# Patient Record
Sex: Female | Born: 1955 | Race: Black or African American | Hispanic: No | Marital: Single | Smoking: Former smoker
Health system: Southern US, Community
[De-identification: ages and names within clinical notes are randomized; demographics above are authoritative.]

## PROBLEM LIST (undated history)

## (undated) DIAGNOSIS — Z8673 Personal history of transient ischemic attack (TIA), and cerebral infarction without residual deficits: Secondary | ICD-10-CM

## (undated) DIAGNOSIS — M25561 Pain in right knee: Secondary | ICD-10-CM

## (undated) DIAGNOSIS — I10 Essential (primary) hypertension: Secondary | ICD-10-CM

## (undated) DIAGNOSIS — G8929 Other chronic pain: Secondary | ICD-10-CM

## (undated) HISTORY — DX: Other chronic pain: G89.29

## (undated) HISTORY — DX: Pain in right knee: M25.561

## (undated) HISTORY — PX: BREAST BIOPSY: SHX20

## (undated) HISTORY — PX: OTHER SURGICAL HISTORY: SHX169

## (undated) HISTORY — PX: CATARACT EXTRACTION: SUR2

## (undated) HISTORY — DX: Personal history of transient ischemic attack (TIA), and cerebral infarction without residual deficits: Z86.73

## (undated) HISTORY — PX: TONSILLECTOMY: SUR1361

---

## 2006-12-05 ENCOUNTER — Emergency Department (HOSPITAL_COMMUNITY): Admission: EM | Admit: 2006-12-05 | Discharge: 2006-12-05 | Payer: Self-pay | Admitting: Family Medicine

## 2008-03-05 ENCOUNTER — Emergency Department (HOSPITAL_COMMUNITY): Admission: EM | Admit: 2008-03-05 | Discharge: 2008-03-05 | Payer: Self-pay | Admitting: Emergency Medicine

## 2008-05-01 ENCOUNTER — Emergency Department (HOSPITAL_COMMUNITY): Admission: EM | Admit: 2008-05-01 | Discharge: 2008-05-01 | Payer: Self-pay | Admitting: Emergency Medicine

## 2011-04-06 ENCOUNTER — Emergency Department (HOSPITAL_COMMUNITY)
Admission: EM | Admit: 2011-04-06 | Discharge: 2011-04-06 | Disposition: A | Discharge status: Home / Self Care | Payer: Medicaid Other | Attending: Emergency Medicine | Admitting: Emergency Medicine

## 2011-04-06 ENCOUNTER — Emergency Department (HOSPITAL_COMMUNITY): Payer: Medicaid Other

## 2011-04-06 DIAGNOSIS — R05 Cough: Secondary | ICD-10-CM | POA: Insufficient documentation

## 2011-04-06 DIAGNOSIS — R110 Nausea: Secondary | ICD-10-CM | POA: Insufficient documentation

## 2011-04-06 DIAGNOSIS — Z8673 Personal history of transient ischemic attack (TIA), and cerebral infarction without residual deficits: Secondary | ICD-10-CM | POA: Insufficient documentation

## 2011-04-06 DIAGNOSIS — I10 Essential (primary) hypertension: Secondary | ICD-10-CM | POA: Insufficient documentation

## 2011-04-06 DIAGNOSIS — E119 Type 2 diabetes mellitus without complications: Secondary | ICD-10-CM | POA: Insufficient documentation

## 2011-04-06 DIAGNOSIS — R059 Cough, unspecified: Secondary | ICD-10-CM | POA: Insufficient documentation

## 2011-04-06 DIAGNOSIS — J069 Acute upper respiratory infection, unspecified: Secondary | ICD-10-CM | POA: Insufficient documentation

## 2011-07-20 ENCOUNTER — Emergency Department (HOSPITAL_COMMUNITY): Payer: Medicaid Other

## 2011-07-20 ENCOUNTER — Emergency Department (HOSPITAL_COMMUNITY)
Admission: EM | Admit: 2011-07-20 | Discharge: 2011-07-20 | Disposition: A | Discharge status: Home / Self Care | Payer: Medicaid Other | Attending: Emergency Medicine | Admitting: Emergency Medicine

## 2011-07-20 DIAGNOSIS — M549 Dorsalgia, unspecified: Secondary | ICD-10-CM | POA: Insufficient documentation

## 2011-07-20 DIAGNOSIS — M545 Low back pain, unspecified: Secondary | ICD-10-CM | POA: Insufficient documentation

## 2011-07-20 DIAGNOSIS — E119 Type 2 diabetes mellitus without complications: Secondary | ICD-10-CM | POA: Insufficient documentation

## 2011-07-20 DIAGNOSIS — I10 Essential (primary) hypertension: Secondary | ICD-10-CM | POA: Insufficient documentation

## 2011-07-20 DIAGNOSIS — Z8673 Personal history of transient ischemic attack (TIA), and cerebral infarction without residual deficits: Secondary | ICD-10-CM | POA: Insufficient documentation

## 2011-07-20 DIAGNOSIS — Z794 Long term (current) use of insulin: Secondary | ICD-10-CM | POA: Insufficient documentation

## 2012-09-12 ENCOUNTER — Emergency Department (HOSPITAL_COMMUNITY): Payer: Medicaid Other

## 2012-09-12 ENCOUNTER — Emergency Department (HOSPITAL_COMMUNITY)
Admission: EM | Admit: 2012-09-12 | Discharge: 2012-09-12 | Disposition: A | Discharge status: Home / Self Care | Payer: Medicaid Other | Attending: Emergency Medicine | Admitting: Emergency Medicine

## 2012-09-12 ENCOUNTER — Encounter (HOSPITAL_COMMUNITY): Payer: Self-pay | Admitting: *Deleted

## 2012-09-12 DIAGNOSIS — E119 Type 2 diabetes mellitus without complications: Secondary | ICD-10-CM | POA: Insufficient documentation

## 2012-09-12 DIAGNOSIS — Z794 Long term (current) use of insulin: Secondary | ICD-10-CM | POA: Insufficient documentation

## 2012-09-12 DIAGNOSIS — I10 Essential (primary) hypertension: Secondary | ICD-10-CM | POA: Insufficient documentation

## 2012-09-12 DIAGNOSIS — R51 Headache: Secondary | ICD-10-CM | POA: Insufficient documentation

## 2012-09-12 HISTORY — DX: Essential (primary) hypertension: I10

## 2012-09-12 MED ORDER — DIPHENHYDRAMINE HCL 25 MG PO CAPS
25.0000 mg | ORAL_CAPSULE | Freq: Once | ORAL | Status: AC
  Administered 2012-09-12: 25 mg via ORAL
  Filled 2012-09-12: qty 1

## 2012-09-12 MED ORDER — METOCLOPRAMIDE HCL 5 MG/ML IJ SOLN
10.0000 mg | Freq: Once | INTRAMUSCULAR | Status: AC
  Administered 2012-09-12: 10 mg via INTRAMUSCULAR
  Filled 2012-09-12: qty 2

## 2012-09-12 NOTE — ED Provider Notes (Signed)
History     CSN: [id number]  Arrival date & time 09/12/12  1629   First MD Initiated Contact with Patient 09/12/12 1827      Chief Complaint  Patient presents with  . Headache     Patient is a 56 y.o. female presenting with headaches. The history is provided by the patient.  Headache  The problem occurs constantly. The problem has been gradually worsening. The headache is associated with nothing. The pain is located in the right unilateral region. The quality of the pain is described as dull. The pain is moderate. The pain does not radiate. Associated symptoms include nausea. Pertinent negatives include no fever, no near-syncope, no syncope and no vomiting. She has tried nothing for the symptoms.  pt presents for headache She reports migraine type headache for past several days but has not had migraine in "years" No trauma No fever No focal weakness No visual changes No tick bites  family reports she has distant h/o stroke.  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 Hypertension   . Diabetes mellitus     History reviewed. No pertinent past surgical history.  No family history on file.  History  Substance Use Topics  . Smoking status: Never Smoker   . Smokeless tobacco: Not on file  . Alcohol Use: No    OB History    Grav Para Term Preterm Abortions TAB SAB Ect Mult Living                  Review of Systems  Constitutional: Negative for fever.  Cardiovascular: Negative for syncope and near-syncope.  Gastrointestinal: Positive for nausea. Negative for vomiting.  Neurological: Positive for headaches.  All other systems reviewed and are negative.    Allergies  Aspirin and Penicillins  Home Medications   Current Outpatient Rx  Name Route Sig Dispense Refill  . INSULIN GLARGINE 100 UNIT/ML [address] SOLN Subcutaneous Inject 1 Units into the skin at bedtime.      BP  128/86  Pulse 75  Temp 98.3 F (36.8 C) (Oral)  Resp 20  SpO2 100%  Physical Exam CONSTITUTIONAL: Well developed/well nourished HEAD AND FACE: Normocephalic/atraumatic.  Tenderness over right temporal area but no bruising/crepitance EYES: EOMI/PERRL, no corneal hazing ENMT: Mucous membranes moist NECK: supple no meningeal signs, no bruits SPINE:entire spine nontender CV: S1/S2 noted, no murmurs/rubs/gallops noted LUNGS: Lungs are clear to auscultation bilaterally, no apparent distress ABDOMEN: soft, nontender, no rebound or guarding GU:no cva tenderness NEURO: Awake/alert, facies symmetric, no arm or leg drift is noted Cranial nerves 3/4/5/6/07/02/09/11/12 tested and intact No ataxia No past pointing EXTREMITIES: pulses normal, full ROM SKIN: warm, color normal PSYCH: no abnormalities of mood noted  ED Course  Procedures    Labs Reviewed  SEDIMENTATION RATE   7:16 PM Family reported h/o "arthritis around her eye" causing headache.  Will send sed rate to r/o temp arteritis Given age and headache, will obtain CT head as no recent ct head noted  Ct head negative Sed rate at 30 Can f/u as outpatient   MDM  Nursing notes including past medical history and social history reviewed and considered in documentation Labs/vital reviewed and considered          Joya Gaskins, MD 09/13/12 0004

## 2012-09-12 NOTE — ED Notes (Signed)
PT ambulated with baseline gait; VSS; A&Ox3; no signs of distress; respirations even and unlabored; skin warm and dry; no questions upon discharge.  

## 2012-09-12 NOTE — ED Notes (Signed)
Patient states HA x 3 days with history of Migraines.  Denies N/V,photophobia, or noise sensitivity.

## 2012-09-12 NOTE — ED Notes (Signed)
Headache for 3 days no nv

## 2013-05-30 ENCOUNTER — Emergency Department (HOSPITAL_COMMUNITY)
Admission: EM | Admit: 2013-05-30 | Discharge: 2013-05-30 | Disposition: A | Discharge status: Home / Self Care | Payer: Medicaid Other | Attending: Emergency Medicine | Admitting: Emergency Medicine

## 2013-05-30 ENCOUNTER — Emergency Department (HOSPITAL_COMMUNITY): Payer: Medicaid Other

## 2013-05-30 ENCOUNTER — Encounter (HOSPITAL_COMMUNITY): Payer: Self-pay

## 2013-05-30 DIAGNOSIS — I10 Essential (primary) hypertension: Secondary | ICD-10-CM | POA: Insufficient documentation

## 2013-05-30 DIAGNOSIS — R739 Hyperglycemia, unspecified: Secondary | ICD-10-CM

## 2013-05-30 DIAGNOSIS — M25569 Pain in unspecified knee: Secondary | ICD-10-CM | POA: Insufficient documentation

## 2013-05-30 DIAGNOSIS — E1169 Type 2 diabetes mellitus with other specified complication: Secondary | ICD-10-CM | POA: Insufficient documentation

## 2013-05-30 DIAGNOSIS — Z880 Allergy status to penicillin: Secondary | ICD-10-CM | POA: Insufficient documentation

## 2013-05-30 DIAGNOSIS — M25561 Pain in right knee: Secondary | ICD-10-CM

## 2013-05-30 DIAGNOSIS — Z794 Long term (current) use of insulin: Secondary | ICD-10-CM | POA: Insufficient documentation

## 2013-05-30 LAB — BASIC METABOLIC PANEL
BUN: 18 mg/dL (ref 6–23)
Creatinine, Ser: 0.8 mg/dL (ref 0.50–1.10)
GFR calc Af Amer: >90 mL/min (ref >90)
GFR calc non Af Amer: 81 mL/min — ABNORMAL LOW (ref >90)
Glucose, Bld: 343 mg/dL — ABNORMAL HIGH (ref 70–99)
Potassium: 6.9 mEq/L (ref 3.5–5.1)
Sodium: 126 mEq/L — ABNORMAL LOW (ref 135–145)

## 2013-05-30 LAB — URINALYSIS, ROUTINE W REFLEX MICROSCOPIC
Bilirubin Urine: NEGATIVE
Glucose, UA: >1000 mg/dL — AB
Ketones, ur: NEGATIVE mg/dL
Leukocytes, UA: NEGATIVE
Nitrite: NEGATIVE
Protein, ur: NEGATIVE mg/dL
Specific Gravity, Urine: 1.037 — ABNORMAL HIGH (ref 1.005–1.030)
Urobilinogen, UA: 0.2 mg/dL (ref 0.0–1.0)

## 2013-05-30 LAB — URINE MICROSCOPIC-ADD ON

## 2013-05-30 LAB — CBC WITH DIFFERENTIAL/PLATELET
Basophils Relative: 1 % (ref 0–1)
Lymphocytes Relative: 50 % — ABNORMAL HIGH (ref 12–46)
Lymphs Abs: 3.7 10*3/uL (ref 0.7–4.0)
Monocytes Absolute: 0.4 10*3/uL (ref 0.1–1.0)
Monocytes Relative: 5 % (ref 3–12)
Neutro Abs: 3.1 10*3/uL (ref 1.7–7.7)
Neutrophils Relative %: 43 % (ref 43–77)
RBC: 4.83 MIL/uL (ref 3.87–5.11)
WBC: 7.3 10*3/uL (ref 4.0–10.5)

## 2013-05-30 LAB — GLUCOSE, CAPILLARY
Glucose-Capillary: 361 mg/dL — ABNORMAL HIGH (ref 70–99)
Glucose-Capillary: 331 mg/dL — ABNORMAL HIGH (ref 70–99)

## 2013-05-30 MED ORDER — ACETAMINOPHEN 500 MG PO TABS: 500.0000 mg | ORAL_TABLET | Freq: Four times a day (QID) | ORAL | Status: DC | PRN

## 2013-05-30 MED ORDER — SODIUM CHLORIDE 0.9 % IV BOLUS (SEPSIS)
1000.0000 mL | Freq: Once | INTRAVENOUS | Status: AC
  Administered 2013-05-30: 1000 mL via INTRAVENOUS

## 2013-05-30 MED ORDER — POTASSIUM CHLORIDE CRYS ER 20 MEQ PO TBCR
80.0000 meq | EXTENDED_RELEASE_TABLET | Freq: Once | ORAL | Status: AC
  Administered 2013-05-30: 80 meq via ORAL
  Filled 2013-05-30: qty 1
  Filled 2013-05-30: qty 3

## 2013-05-30 MED ORDER — ACETAMINOPHEN 500 MG PO TABS
1000.0000 mg | ORAL_TABLET | Freq: Once | ORAL | Status: AC
  Administered 2013-05-30: 1000 mg via ORAL
  Filled 2013-05-30: qty 2

## 2013-05-30 NOTE — ED Notes (Signed)
Pt alert x4 family at bedside. Has been having elevated blood sugars since yesterday evening.Pt states sugars have been in the 400s. Pt had been feeling tired. She states that she has been taking her insulin as scheduled. Will monitor.

## 2013-05-30 NOTE — ED Provider Notes (Signed)
History     CSN: [id number]  Arrival date & time 05/30/13  1702   First MD Initiated Contact with Patient 05/30/13 1918      Chief Complaint  Patient presents with  . Hyperglycemia  . Knee Pain    HPI  Patient is a concern of right knee pain and hyperglycemia. Chief complaint history knee pain. She states that this pain began several days ago, since onset has become more severe, worse with weightbearing or ambulating. There is associated effusion, but no erythema, no fever, no chills, no distal dysesthesia or weakness. She has not taken any medication for pain relief thus far. The patient is also concerned about hyperglycemia, though she states her blood sugar at home was 100. She states that she is on medication as directed, including insulin which she takes nightly. She has not taken today's dose, and it is time for her medication currently.   Past Medical History  Diagnosis Date  . Hypertension   . Diabetes mellitus     History reviewed. No pertinent past surgical history.  Family History  Problem Relation Age of Onset  . Diabetes Brother   . Hypertension Brother   . Diabetes Other   . Hypertension Other     History  Substance Use Topics  . Smoking status: Never Smoker   . Smokeless tobacco: Never Used  . Alcohol Use: No    OB History   Grav Para Term Preterm Abortions TAB SAB Ect Mult Living                  Review of Systems  Constitutional:       Per HPI, otherwise negative  HENT:       Per HPI, otherwise negative  Respiratory:       Per HPI, otherwise negative  Cardiovascular:       Per HPI, otherwise negative  Gastrointestinal: Negative for nausea and vomiting.  Endocrine: Negative for polyuria.  Genitourinary:       Neg aside from HPI   Musculoskeletal:       Per HPI, otherwise negative  Neurological: Negative for syncope.    Allergies  Aspirin and Penicillins  Home Medications   Current Outpatient Rx  Name  Route  Sig  Dispense   Refill  . insulin glargine (LANTUS) 100 UNIT/ML injection   Subcutaneous   Inject 1 Units into the skin at bedtime.           BP 120/71  Pulse 79  Temp(Src) 98.1 F (36.7 C) (Oral)  Resp 16  Ht 5\' 9"  (1.753 m)  Wt 207 lb 8 oz (94.121 kg)  BMI 30.63 kg/m2  SpO2 98%  Physical Exam  Nursing note and vitals reviewed. Constitutional: She is oriented to person, place, and time. She appears well-developed and well-nourished. No distress.  HENT:  Head: Normocephalic and atraumatic.  Eyes: Conjunctivae and EOM are normal.  Cardiovascular: Normal rate and regular rhythm.   Pulmonary/Chest: Effort normal and breath sounds normal. No stridor. No respiratory distress.  Abdominal: She exhibits no distension.  Musculoskeletal: She exhibits no edema.       Right hip: Normal.       Left hip: Normal.       Right knee: She exhibits decreased range of motion, swelling and effusion. She exhibits no ecchymosis, no deformity, no laceration, no erythema, normal alignment, no LCL laxity and normal patellar mobility. Tenderness found. No medial joint line, no MCL, no LCL and no patellar tendon tenderness  noted.       Left knee: Normal.       Right ankle: Normal.       Left ankle: Normal.  Neurological: She is alert and oriented to person, place, and time. No cranial nerve deficit.  Skin: Skin is warm and dry.  Psychiatric: She has a normal mood and affect.    ED Course  ARTHOCENTESIS Date/Time: 05/30/2013 9:30 PM Performed by: Gerhard Munch Authorized by: Gerhard Munch Consent: Verbal consent obtained. The procedure was performed in an emergent situation. Consent given by: patient Patient understanding: patient states understanding of the procedure being performed Patient consent: the patient's understanding of the procedure matches consent given Procedure consent: procedure consent matches procedure scheduled Relevant documents: relevant documents present and verified Test results: test  results available and properly labeled Site marked: the operative site was marked Imaging studies: imaging studies available Required items: required blood products, implants, devices, and special equipment available Patient identity confirmed: verbally with patient Time out: Immediately prior to procedure a "time out" was called to verify the correct patient, procedure, equipment, support staff and site/side marked as required. Indications: joint swelling, pain and diagnostic evaluation  Body area: knee Joint: right knee Local anesthesia used: yes Anesthesia: local infiltration Local anesthetic: lidocaine 2% without epinephrine Anesthetic total: 5 ml Patient sedated: no Preparation: Patient was prepped and draped in the usual sterile fashion. Needle gauge: 18 G Approach: medial Aspirate amount (ml): none obtained. Patient tolerance: Patient tolerated the procedure well with no immediate complications. Comments: No fluid obtained   (including critical care time)  Labs Reviewed  GLUCOSE, CAPILLARY - Abnormal; Notable for the following:    Glucose-Capillary 361 (*)    All other components within normal limits  CBC WITH DIFFERENTIAL  BASIC METABOLIC PANEL  URINALYSIS, ROUTINE W REFLEX MICROSCOPIC   No results found.   No diagnosis found.  o2-99%ra  Potassium was redrawn to 2 hyperkalemia with hemolyzed sample.  Repeat potassium is low  11:02 PM On repeat exam the patient appears calm, in no distress.  I discussed the findings at that evaluation with her and her daughter.     MDM  This patient presents with concern of knee pain, hyperglycemia.  On exam she is awake and alert, in no distress, hemodynamic stable. There is low suspicion for septic arthralgia or gout, given the absence of fever, erythema, significant pain. However, given the patient's endorsement of increasing pain, x-ray was performed.  This demonstrated degenerative changes and effusion.  An attempt was  made to drain the effusion, but no fluid was obtained. However, the patient improved with IV fluids, analgesics, ice.  Discussed return precautions, the need to follow up with orthopedist, possibly involving the patient's primary care physician to facilitate this.  She is discharged in stable condition        Gerhard Munch, MD 05/30/13 2305

## 2013-05-30 NOTE — ED Notes (Signed)
Patient reports that she has had high blood sugar since yesterday and swelling of right knee x 2 days.

## 2014-08-14 ENCOUNTER — Ambulatory Visit (INDEPENDENT_AMBULATORY_CARE_PROVIDER_SITE_OTHER): Payer: Medicaid Other | Admitting: Family Medicine

## 2014-08-14 VITALS — BP 133/88 | HR 59 | Temp 98.2°F | Resp 18 | Ht 69.0 in | Wt 220.0 lb

## 2014-08-14 DIAGNOSIS — Z87898 Personal history of other specified conditions: Secondary | ICD-10-CM

## 2014-08-14 DIAGNOSIS — E1165 Type 2 diabetes mellitus with hyperglycemia: Secondary | ICD-10-CM

## 2014-08-14 DIAGNOSIS — Z23 Encounter for immunization: Secondary | ICD-10-CM | POA: Diagnosis not present

## 2014-08-14 DIAGNOSIS — I10 Essential (primary) hypertension: Secondary | ICD-10-CM

## 2014-08-14 DIAGNOSIS — H6501 Acute serous otitis media, right ear: Secondary | ICD-10-CM

## 2014-08-14 DIAGNOSIS — M1711 Unilateral primary osteoarthritis, right knee: Secondary | ICD-10-CM

## 2014-08-14 DIAGNOSIS — H6500 Acute serous otitis media, unspecified ear: Secondary | ICD-10-CM

## 2014-08-14 DIAGNOSIS — M1710 Unilateral primary osteoarthritis, unspecified knee: Secondary | ICD-10-CM

## 2014-08-14 DIAGNOSIS — Z8679 Personal history of other diseases of the circulatory system: Secondary | ICD-10-CM

## 2014-08-14 DIAGNOSIS — I6930 Unspecified sequelae of cerebral infarction: Secondary | ICD-10-CM

## 2014-08-14 DIAGNOSIS — E118 Type 2 diabetes mellitus with unspecified complications: Secondary | ICD-10-CM

## 2014-08-14 DIAGNOSIS — I6990 Unspecified sequelae of unspecified cerebrovascular disease: Secondary | ICD-10-CM

## 2014-08-14 DIAGNOSIS — IMO0002 Reserved for concepts with insufficient information to code with codable children: Secondary | ICD-10-CM

## 2014-08-14 DIAGNOSIS — E785 Hyperlipidemia, unspecified: Secondary | ICD-10-CM

## 2014-08-14 MED ORDER — CIPROFLOXACIN HCL 500 MG PO TABS: 500.0000 mg | ORAL_TABLET | Freq: Two times a day (BID) | ORAL | Status: DC

## 2014-08-14 NOTE — Patient Instructions (Signed)
Diabetes and Foot Care Diabetes may cause you to have problems because of poor blood supply (circulation) to your feet and legs. This may cause the skin on your feet to become thinner, break easier, and heal more slowly. Your skin may become dry, and the skin may peel and crack. You may also have nerve damage in your legs and feet causing decreased feeling in them. You may not notice minor injuries to your feet that could lead to infections or more serious problems. Taking care of your feet is one of the most important things you can do for yourself.  HOME CARE INSTRUCTIONS  Wear shoes at all times, even in the house. Do not go barefoot. Bare feet are easily injured.  Check your feet daily for blisters, cuts, and redness. If you cannot see the bottom of your feet, use a mirror or ask someone for help.  Wash your feet with warm water (do not use hot water) and mild soap. Then pat your feet and the areas between your toes until they are completely dry. Do not soak your feet as this can dry your skin.  Apply a moisturizing lotion or petroleum jelly (that does not contain alcohol and is unscented) to the skin on your feet and to dry, brittle toenails. Do not apply lotion between your toes.  Trim your toenails straight across. Do not dig under them or around the cuticle. File the edges of your nails with an emery board or nail file.  Do not cut corns or calluses or try to remove them with medicine.  Wear clean socks or stockings every day. Make sure they are not too tight. Do not wear knee-high stockings since they may decrease blood flow to your legs.  Wear shoes that fit properly and have enough cushioning. To break in new shoes, wear them for just a few hours a day. This prevents you from injuring your feet. Always look in your shoes before you put them on to be sure there are no objects inside.  Do not cross your legs. This may decrease the blood flow to your feet.  If you find a minor scrape,  cut, or break in the skin on your feet, keep it and the skin around it clean and dry. These areas may be cleansed with mild soap and water. Do not cleanse the area with peroxide, alcohol, or iodine.  When you remove an adhesive bandage, be sure not to damage the skin around it.  If you have a wound, look at it several times a day to make sure it is healing.  Do not use heating pads or hot water bottles. They may burn your skin. If you have lost feeling in your feet or legs, you may not know it is happening until it is too late.  Make sure your health care provider performs a complete foot exam at least annually or more often if you have foot problems. Report any cuts, sores, or bruises to your health care provider immediately. SEEK MEDICAL CARE IF:   You have an injury that is not healing.  You have cuts or breaks in the skin.  You have an ingrown nail.  You notice redness on your legs or feet.  You feel burning or tingling in your legs or feet.  You have pain or cramps in your legs and feet.  Your legs or feet are numb.  Your feet always feel cold. SEEK IMMEDIATE MEDICAL CARE IF:   There is increasing redness,   swelling, or pain in or around a wound.  There is a red line that goes up your leg.  Pus is coming from a wound.  You develop a fever or as directed by your health care provider.  You notice a bad smell coming from an ulcer or wound. Document Released: 12/08/2000 Document Revised: 08/13/2013 Document Reviewed: 05/20/2013 [facility] Patient Information 2015 [place], [place]. This information is not intended to replace advice given to you by your health care provider. Make sure you discuss any questions you have with your health care provider. Diabetes and Exercise Exercising regularly is important. It is not just about losing weight. It has many health benefits, such as:  Improving your overall fitness, flexibility, and endurance.  Increasing your bone  density.  Helping with weight control.  Decreasing your body fat.  Increasing your muscle strength.  Reducing stress and tension.  Improving your overall health. People with diabetes who exercise gain additional benefits because exercise:  Reduces appetite.  Improves the body's use of blood sugar (glucose).  Helps lower or control blood glucose.  Decreases blood pressure.  Helps control blood lipids (such as cholesterol and triglycerides).  Improves the body's use of the hormone insulin by:  Increasing the body's insulin sensitivity.  Reducing the body's insulin needs.  Decreases the risk for heart disease because exercising:  Lowers cholesterol and triglycerides levels.  Increases the levels of good cholesterol (such as high-density lipoproteins [HDL]) in the body.  Lowers blood glucose levels. YOUR ACTIVITY PLAN  Choose an activity that you enjoy and set realistic goals. Your health care provider or diabetes educator can help you make an activity plan that works for you. Exercise regularly as directed by your health care provider. This includes:  Performing resistance training twice a week such as push-ups, sit-ups, lifting weights, or using resistance bands.  Performing 150 minutes of cardio exercises each week such as walking, running, or playing sports.  Staying active and spending no more than 90 minutes at one time being inactive. Even short bursts of exercise are good for you. Three 10-minute sessions spread throughout the day are just as beneficial as a single 30-minute session. Some exercise ideas include:  Taking the dog for a walk.  Taking the stairs instead of the elevator.  Dancing to your favorite song.  Doing an exercise video.  Doing your favorite exercise with a friend. RECOMMENDATIONS FOR EXERCISING WITH TYPE 1 OR TYPE 2 DIABETES   Check your blood glucose before exercising. If blood glucose levels are greater than 240 mg/dL, check for urine  ketones. Do not exercise if ketones are present.  Avoid injecting insulin into areas of the body that are going to be exercised. For example, avoid injecting insulin into:  The arms when playing tennis.  The legs when jogging.  Keep a record of:  Food intake before and after you exercise.  Expected peak times of insulin action.  Blood glucose levels before and after you exercise.  The type and amount of exercise you have done.  Review your records with your health care provider. Your health care provider will help you to develop guidelines for adjusting food intake and insulin amounts before and after exercising.  If you take insulin or oral hypoglycemic agents, watch for signs and symptoms of hypoglycemia. They include:  Dizziness.  Shaking.  Sweating.  Chills.  Confusion.  Drink plenty of water while you exercise to prevent dehydration or heat stroke. Body water is lost during exercise and must be  replaced.  Talk to your health care provider before starting an exercise program to make sure it is safe for you. Remember, almost any type of activity is better than none. Document Released: 03/02/2004 Document Revised: 04/27/2014 Document Reviewed: 05/20/2013 [facility] Patient Information 2015 [place], [place]. This information is not intended to replace advice given to you by your health care provider. Make sure you discuss any questions you have with your health care provider. Otitis Media Otitis media is redness, soreness, and inflammation of the middle ear. Otitis media may be caused by allergies or, most commonly, by infection. Often it occurs as a complication of the common cold. SIGNS AND SYMPTOMS Symptoms of otitis media may include:  Earache.  Fever.  Ringing in your ear.  Headache.  Leakage of fluid from the ear. DIAGNOSIS To diagnose otitis media, your health care provider will examine your ear with an otoscope. This is an instrument that allows your health  care provider to see into your ear in order to examine your eardrum. Your health care provider also will ask you questions about your symptoms. TREATMENT  Typically, otitis media resolves on its own within 3-5 days. Your health care provider may prescribe medicine to ease your symptoms of pain. If otitis media does not resolve within 5 days or is recurrent, your health care provider may prescribe antibiotic medicines if he or she suspects that a bacterial infection is the cause. HOME CARE INSTRUCTIONS   If you were prescribed an antibiotic medicine, finish it all even if you start to feel better.  Take medicines only as directed by your health care provider.  Keep all follow-up visits as directed by your health care provider. SEEK MEDICAL CARE IF:  You have otitis media only in one ear, or bleeding from your nose, or both.  You notice a lump on your neck.  You are not getting better in 3-5 days.  You feel worse instead of better. SEEK IMMEDIATE MEDICAL CARE IF:   You have pain that is not controlled with medicine.  You have swelling, redness, or pain around your ear or stiffness in your neck.  You notice that part of your face is paralyzed.  You notice that the bone behind your ear (mastoid) is tender when you touch it. MAKE SURE YOU:   Understand these instructions.  Will watch your condition.  Will get help right away if you are not doing well or get worse. Document Released: 09/15/2004 Document Revised: 04/27/2014 Document Reviewed: 07/08/2013 [facility] Patient Information 2015 [place], [place]. This information is not intended to replace advice given to you by your health care provider. Make sure you discuss any questions you have with your health care provider.

## 2014-08-14 NOTE — Progress Notes (Signed)
Subjective:    Patient ID: Candace Diaz, female    DOB: 26-Apr-1956, 58 y.o.   MRN: [id number]  HPI  Patient states that she was a patient of Dr. Della Goo until  January 2015. She states that has not been followed by a provider since the practice closed.     Patient here for hypertension. She is not exercising and is adherent to low salt diet.  Blood pressure is well controlled at home.. Patient denies chest pain, dyspnea, fatigue, near-syncope, orthopnea, palpitations, syncope and tachypnea.  Cardiovascular risk factors include diabetes mellitus, dyslipidemia, obesity (BMI >= 30 kg/m2) and sedentary lifestyle.    Patient also reports a history of type II diabetes. She states that she checks blood sugars twice daily. She says that her average daily blood sugar is around 150. She consistently takes medications, but does not exercise or follow a specific carbohydrate modified diet. She denies headaches, weakness, polyphagia, polydipsia, polyuria, nausea, vomiting, and diarrhea.   Patient also complaining of right knee pain. She states that she has a history of arthritis to right knee. Onset of the symptoms was several years ago. Inciting event: this is a longstanding problem which has been getting worse. Current symptoms include stiffness and swelling. Pain is aggravated by going up and down stairs, inactivity, standing and walking.  Patient has had prior knee problems. She states that she has had orthopedic evaluation in the past.      Review of Systems  Constitutional: Positive for fatigue. Negative for unexpected weight change.  HENT: Negative.  Negative for dental problem, ear pain and trouble swallowing.   Eyes: Negative.  Negative for visual disturbance.  Respiratory: Negative for chest tightness and shortness of breath.   Cardiovascular: Negative for palpitations.  Gastrointestinal: Negative.   Endocrine: Negative.  Negative for polydipsia, polyphagia and polyuria.   Musculoskeletal: Positive for arthralgias, joint swelling (occasional right knee pain and swelling) and myalgias.  Skin: Negative.   Allergic/Immunologic: Negative.   Neurological: Positive for weakness.  Hematological: Negative.   Psychiatric/Behavioral: Negative.  Negative for sleep disturbance. The patient is not nervous/anxious.       Objective:   Physical Exam  Constitutional: She appears well-developed. She does not have a sickly appearance. She does not appear ill. No distress.  HENT:  Right Ear: There is drainage (purulent drainage). No foreign bodies. No mastoid tenderness. Tympanic membrane is erythematous.  Left Ear: There is drainage. No mastoid tenderness. Tympanic membrane is erythematous.  Nose: Nose normal.  Mouth/Throat: Uvula is midline. Abnormal dentition.  Eyes: EOM are normal. Pupils are equal, round, and reactive to light.  Musculoskeletal:       Right knee: She exhibits decreased range of motion and swelling. She exhibits no ecchymosis and no erythema.  Lymphadenopathy:       Head (right side): No submental, no submandibular, no preauricular and no posterior auricular adenopathy present.       Head (left side): No submental, no submandibular, no preauricular and no posterior auricular adenopathy present.  Neurological: She is alert. She has normal strength. No cranial nerve deficit or sensory deficit. She displays a negative Romberg sign. Gait normal.  Skin: Skin is warm, dry and intact.  Psychiatric: She has a normal mood and affect. Her speech is slurred. She is slowed. Cognition and memory are impaired. She expresses inappropriate judgment.        BP 133/88  Pulse 59  Temp(Src) 98.2 F (36.8 C) (Oral)  Resp 18  Ht 5\' 9"  (1.753  m)  Wt 220 lb (99.791 kg)  BMI 32.47 kg/m2 Assessment & Plan:   1. Right acute serous otitis media, recurrence not specified - Candace Diaz has acute otitis media ear with purulent drainage. Obtained ear culture. She states  that she has had frequent ear infections throughout her life.  - Ear Culture - ciprofloxacin (CIPRO) 500 MG tablet; Take 1 tablet (500 mg total) by mouth 2 (two) times daily.  Dispense: 20 tablet; Refill: 0  2. Unspecified essential hypertension - CBC with Differential - COMPLETE METABOLIC PANEL WITH GFR - Urinalysis, Complete  3. Other and unspecified hyperlipidemia -Patient reports a history of high cholesterol - Lipid Panel - Hemoglobin A1c  4. Type II or unspecified type diabetes mellitus with unspecified complication, uncontrolled -Patient states that average blood sugar is 150. She checks blood sugar every morning.  Currently takes Lantus 10 units HS and glucotrol 10 mg twice daily -Continue medications as previously prescribed, will check hemoglobin A1C and CMP. Patient states that she watches her sugar intake and checks her blood sugars daily -Hemoglobin A1C - Urinalysis, Complete  5. Right knee arthritis- -She reports a history of right knee arthritis. She states that she experiences periodic swelling and stiffness. Recommend Tylenol 500 mg twice daily as needed for increase right knee pain -Apply warm compresses to knee 20 minutes 4 times daily as needed.  -Elevate knee to heart level while at rest  6. History of CVA Patient reports that she had a stroke 7 years ago. Left with weakness on her right side.  -Candace Diaz reports that she is allergic to aspirin therapy so she is not on therapy.  -Will check lipid panel, patient reports as history of hyperlipidemia, but is not on statin therapy. Will calculate ASCVD results.      7.  History of palpitations  - EKG 12-Lead-NSR  8. Immunization due - Pneumococcal polysaccharide vaccine 23-valent greater than or equal to 2yo subcutaneous/IM  9. Need for Tdap vaccination  - Tdap vaccine greater than or equal to 7yo IM    Colonoscopy-She states that she recently had a colonoscopy in [place], will review records as they  become available History of cataract surgery in [place] Pap smear-She states that it has been several years, will obtain during CPE Mammogram-Will send referral for mammogram, she states that it has been years   Massie MaroonHollis,Roberts Bon M, FNP

## 2014-08-15 LAB — LIPID PANEL
Cholesterol: 192 mg/dL (ref 0–200)
HDL: 47 mg/dL (ref >39)
LDL CALC: 109 mg/dL — AB (ref 0–99)
Total CHOL/HDL Ratio: 4.1 Ratio
Triglycerides: 182 mg/dL — ABNORMAL HIGH (ref <150)
VLDL: 36 mg/dL (ref 0–40)

## 2014-08-15 LAB — URINALYSIS, COMPLETE
Bacteria, UA: NONE SEEN
Bilirubin Urine: NEGATIVE
CRYSTALS: NONE SEEN
Casts: NONE SEEN
GLUCOSE, UA: NEGATIVE mg/dL
Hgb urine dipstick: NEGATIVE
Ketones, ur: NEGATIVE mg/dL
LEUKOCYTES UA: NEGATIVE
Nitrite: NEGATIVE
PH: 7.5 (ref 5.0–8.0)
Protein, ur: NEGATIVE mg/dL
SPECIFIC GRAVITY, URINE: 1.012 (ref 1.005–1.030)
SQUAMOUS EPITHELIAL / LPF: NONE SEEN
Urobilinogen, UA: 0.2 mg/dL (ref 0.0–1.0)

## 2014-08-15 LAB — COMPLETE METABOLIC PANEL WITH GFR
ALK PHOS: 67 U/L (ref 39–117)
ALT: 32 U/L (ref 0–35)
AST: 24 U/L (ref 0–37)
Albumin: 4.5 g/dL (ref 3.5–5.2)
BILIRUBIN TOTAL: 0.5 mg/dL (ref 0.2–1.2)
BUN: 9 mg/dL (ref 6–23)
CHLORIDE: 97 meq/L (ref 96–112)
CO2: 31 mEq/L (ref 19–32)
CREATININE: 0.8 mg/dL (ref 0.50–1.10)
Calcium: 9.6 mg/dL (ref 8.4–10.5)
GFR, Est African American: >89 mL/min
GFR, Est Non African American: 82 mL/min
Glucose, Bld: 161 mg/dL — ABNORMAL HIGH (ref 70–99)
Potassium: 3.2 mEq/L — ABNORMAL LOW (ref 3.5–5.3)
Sodium: 139 mEq/L (ref 135–145)
Total Protein: 8.2 g/dL (ref 6.0–8.3)

## 2014-08-15 LAB — CBC WITH DIFFERENTIAL/PLATELET
Basophils Absolute: 0.1 10*3/uL (ref 0.0–0.1)
Basophils Relative: 1 % (ref 0–1)
EOS ABS: 0.1 10*3/uL (ref 0.0–0.7)
Eosinophils Relative: 2 % (ref 0–5)
HCT: 35.2 % — ABNORMAL LOW (ref 36.0–46.0)
Hemoglobin: 12.1 g/dL (ref 12.0–15.0)
LYMPHS PCT: 50 % — AB (ref 12–46)
Lymphs Abs: 3.3 10*3/uL (ref 0.7–4.0)
MCH: 29.7 pg (ref 26.0–34.0)
MCHC: 34.4 g/dL (ref 30.0–36.0)
MCV: 86.5 fL (ref 78.0–100.0)
Monocytes Absolute: 0.3 10*3/uL (ref 0.1–1.0)
Monocytes Relative: 4 % (ref 3–12)
Neutro Abs: 2.8 10*3/uL (ref 1.7–7.7)
Neutrophils Relative %: 43 % (ref 43–77)
Platelets: 352 10*3/uL (ref 150–400)
RBC: 4.07 MIL/uL (ref 3.87–5.11)
RDW: 12.8 % (ref 11.5–15.5)
WBC: 6.5 10*3/uL (ref 4.0–10.5)

## 2014-08-15 LAB — HEMOGLOBIN A1C
HEMOGLOBIN A1C: 10.1 % — AB (ref <5.7)
Mean Plasma Glucose: 243 mg/dL — ABNORMAL HIGH (ref <117)

## 2014-08-19 LAB — EAR CULTURE

## 2014-08-20 ENCOUNTER — Encounter: Payer: Self-pay | Admitting: Family Medicine

## 2014-08-20 ENCOUNTER — Telehealth: Payer: Self-pay | Admitting: Family Medicine

## 2014-08-20 DIAGNOSIS — I10 Essential (primary) hypertension: Secondary | ICD-10-CM | POA: Insufficient documentation

## 2014-08-20 DIAGNOSIS — H6500 Acute serous otitis media, unspecified ear: Secondary | ICD-10-CM | POA: Insufficient documentation

## 2014-08-20 DIAGNOSIS — E118 Type 2 diabetes mellitus with unspecified complications: Principal | ICD-10-CM

## 2014-08-20 DIAGNOSIS — Z87898 Personal history of other specified conditions: Secondary | ICD-10-CM | POA: Insufficient documentation

## 2014-08-20 DIAGNOSIS — IMO0002 Reserved for concepts with insufficient information to code with codable children: Secondary | ICD-10-CM

## 2014-08-20 DIAGNOSIS — E119 Type 2 diabetes mellitus without complications: Secondary | ICD-10-CM | POA: Insufficient documentation

## 2014-08-20 DIAGNOSIS — E1165 Type 2 diabetes mellitus with hyperglycemia: Secondary | ICD-10-CM

## 2014-08-20 DIAGNOSIS — E785 Hyperlipidemia, unspecified: Secondary | ICD-10-CM | POA: Insufficient documentation

## 2014-08-20 DIAGNOSIS — Z23 Encounter for immunization: Secondary | ICD-10-CM | POA: Insufficient documentation

## 2014-08-20 DIAGNOSIS — M1711 Unilateral primary osteoarthritis, right knee: Secondary | ICD-10-CM | POA: Insufficient documentation

## 2014-08-20 DIAGNOSIS — I6930 Unspecified sequelae of cerebral infarction: Secondary | ICD-10-CM | POA: Insufficient documentation

## 2014-08-20 DIAGNOSIS — E876 Hypokalemia: Secondary | ICD-10-CM

## 2014-08-20 MED ORDER — INSULIN GLARGINE 100 UNIT/ML ~~LOC~~ SOLN: 15.0000 [IU] | Freq: Every day | SUBCUTANEOUS | Status: DC

## 2014-08-20 MED ORDER — POTASSIUM CHLORIDE CRYS ER 20 MEQ PO TBCR
20.0000 meq | EXTENDED_RELEASE_TABLET | Freq: Every day | ORAL | Status: DC
Start: 2014-08-20 — End: 2014-11-03

## 2014-08-20 NOTE — Telephone Encounter (Signed)
Reviewed labs, hemoglobin A1C elevated at 10.1. Will increase Lantus to 15 units at bedtime. Candace Diaz is to continue checking blood sugars twice daily.  Also, re-ordered potassium, current potassium level is 3.2  Meds ordered this encounter  Medications  . insulin glargine (LANTUS) 100 UNIT/ML injection    Sig: Inject 0.15 mLs (15 Units total) into the skin at bedtime.    Dispense:  10 mL    Refill:  2    Order Specific Question:  Supervising Provider    Answer:  MATTHEWS, MICHELLE A [[id number]]  . potassium chloride SA (K-DUR,KLOR-CON) 20 MEQ tablet    Sig: Take 1 tablet (20 mEq total) by mouth daily. With food    Dispense:  30 tablet    Refill:  2    Order Specific Question:  Supervising Provider    Answer:  Marthann Schiller A [[id number]]  Massie Maroon, FNP

## 2014-08-21 ENCOUNTER — Telehealth: Payer: Self-pay

## 2014-08-21 NOTE — Telephone Encounter (Signed)
Left message for patient to call back regarding medication changes 

## 2014-08-24 NOTE — Telephone Encounter (Signed)
Called and spoke with patient advised her to increase her lantus to 15 units at bedtime every night, to continue to check her blood sugar bid, to pick up potassium and start taking as prescribed for low potassium level, and to keep 11/26/2014 appointment. Patient verbalized understanding.

## 2014-09-07 ENCOUNTER — Other Ambulatory Visit: Payer: Self-pay

## 2014-09-07 MED ORDER — CLONIDINE HCL 0.3 MG PO TABS: 0.3000 mg | ORAL_TABLET | Freq: Three times a day (TID) | ORAL | Status: DC

## 2014-09-07 MED ORDER — GLIPIZIDE 10 MG PO TABS: 10.0000 mg | ORAL_TABLET | Freq: Two times a day (BID) | ORAL | Status: DC

## 2014-09-07 NOTE — Telephone Encounter (Signed)
Patient called, requesting refills on clonadine and glipizide. Refilled via e-script and sent to pharmacy. Thanks!

## 2014-10-06 ENCOUNTER — Telehealth: Payer: Self-pay | Admitting: Internal Medicine

## 2014-10-06 MED ORDER — HYDROCHLOROTHIAZIDE 25 MG PO TABS: 25.0000 mg | ORAL_TABLET | ORAL | Status: DC

## 2014-10-06 NOTE — Telephone Encounter (Signed)
Refill sent in via e-script, thanks!

## 2014-11-03 ENCOUNTER — Other Ambulatory Visit: Payer: Self-pay | Admitting: Family Medicine

## 2014-11-11 ENCOUNTER — Other Ambulatory Visit: Payer: Self-pay

## 2014-11-11 ENCOUNTER — Other Ambulatory Visit: Payer: Self-pay | Admitting: Family Medicine

## 2014-11-11 MED ORDER — FUROSEMIDE 40 MG PO TABS: 40.0000 mg | ORAL_TABLET | ORAL | Status: DC

## 2014-11-26 ENCOUNTER — Ambulatory Visit (INDEPENDENT_AMBULATORY_CARE_PROVIDER_SITE_OTHER): Payer: Medicaid Other | Admitting: Internal Medicine

## 2014-11-26 ENCOUNTER — Emergency Department (HOSPITAL_COMMUNITY)
Admission: EM | Admit: 2014-11-26 | Discharge: 2014-11-26 | Disposition: A | Discharge status: Home / Self Care | Payer: Medicaid Other | Attending: Emergency Medicine | Admitting: Emergency Medicine

## 2014-11-26 ENCOUNTER — Encounter (HOSPITAL_COMMUNITY): Payer: Self-pay | Admitting: Emergency Medicine

## 2014-11-26 ENCOUNTER — Emergency Department (HOSPITAL_COMMUNITY): Payer: Medicaid Other

## 2014-11-26 ENCOUNTER — Other Ambulatory Visit: Payer: Self-pay | Admitting: Family Medicine

## 2014-11-26 VITALS — BP 184/118 | HR 104 | Temp 97.9°F | Resp 16 | Ht 69.0 in | Wt 219.0 lb

## 2014-11-26 DIAGNOSIS — I10 Essential (primary) hypertension: Secondary | ICD-10-CM | POA: Diagnosis present

## 2014-11-26 DIAGNOSIS — Z880 Allergy status to penicillin: Secondary | ICD-10-CM | POA: Diagnosis not present

## 2014-11-26 DIAGNOSIS — Z79899 Other long term (current) drug therapy: Secondary | ICD-10-CM | POA: Insufficient documentation

## 2014-11-26 DIAGNOSIS — E119 Type 2 diabetes mellitus without complications: Secondary | ICD-10-CM | POA: Insufficient documentation

## 2014-11-26 DIAGNOSIS — Z8673 Personal history of transient ischemic attack (TIA), and cerebral infarction without residual deficits: Secondary | ICD-10-CM | POA: Diagnosis not present

## 2014-11-26 LAB — CBC WITH DIFFERENTIAL/PLATELET
BASOS PCT: 1 % (ref 0–1)
Basophils Absolute: 0 10*3/uL (ref 0.0–0.1)
Eosinophils Absolute: 0.1 10*3/uL (ref 0.0–0.7)
Eosinophils Relative: 2 % (ref 0–5)
HCT: 37.7 % (ref 36.0–46.0)
HEMOGLOBIN: 12 g/dL (ref 12.0–15.0)
Lymphocytes Relative: 49 % — ABNORMAL HIGH (ref 12–46)
Lymphs Abs: 3.2 10*3/uL (ref 0.7–4.0)
MCH: 28.3 pg (ref 26.0–34.0)
MCHC: 31.8 g/dL (ref 30.0–36.0)
MCV: 88.9 fL (ref 78.0–100.0)
MONOS PCT: 5 % (ref 3–12)
Monocytes Absolute: 0.3 10*3/uL (ref 0.1–1.0)
NEUTROS ABS: 2.8 10*3/uL (ref 1.7–7.7)
NEUTROS PCT: 43 % (ref 43–77)
PLATELETS: 292 10*3/uL (ref 150–400)
RBC: 4.24 MIL/uL (ref 3.87–5.11)
RDW: 13.1 % (ref 11.5–15.5)
WBC: 6.4 10*3/uL (ref 4.0–10.5)

## 2014-11-26 LAB — COMPREHENSIVE METABOLIC PANEL
ALBUMIN: 4.4 g/dL (ref 3.5–5.2)
ALK PHOS: 61 U/L (ref 39–117)
ALT: 42 U/L — ABNORMAL HIGH (ref 0–35)
ANION GAP: 13 (ref 5–15)
AST: 37 U/L (ref 0–37)
BILIRUBIN TOTAL: 0.4 mg/dL (ref 0.3–1.2)
BUN: 16 mg/dL (ref 6–23)
CO2: 30 mEq/L (ref 19–32)
Calcium: 9.8 mg/dL (ref 8.4–10.5)
Chloride: 101 mEq/L (ref 96–112)
Creatinine, Ser: 0.8 mg/dL (ref 0.50–1.10)
GFR calc Af Amer: >90 mL/min (ref >90)
GFR calc non Af Amer: 80 mL/min — ABNORMAL LOW (ref >90)
Glucose, Bld: 107 mg/dL — ABNORMAL HIGH (ref 70–99)
POTASSIUM: 3.6 meq/L — AB (ref 3.7–5.3)
Sodium: 144 mEq/L (ref 137–147)
TOTAL PROTEIN: 8.7 g/dL — AB (ref 6.0–8.3)

## 2014-11-26 LAB — CBG MONITORING, ED: Glucose-Capillary: 111 mg/dL — ABNORMAL HIGH (ref 70–99)

## 2014-11-26 LAB — TROPONIN I: Troponin I: <0.3 ng/mL (ref <0.30)

## 2014-11-26 MED ORDER — SODIUM CHLORIDE 0.9 % IV BOLUS (SEPSIS)
500.0000 mL | Freq: Once | INTRAVENOUS | Status: AC
  Administered 2014-11-26: 500 mL via INTRAVENOUS

## 2014-11-26 MED ORDER — CLONIDINE HCL 0.1 MG PO TABS
0.3000 mg | ORAL_TABLET | ORAL | Status: AC
  Administered 2014-11-26: 0.3 mg via ORAL
  Filled 2014-11-26: qty 3

## 2014-11-26 NOTE — Discharge Instructions (Signed)

## 2014-11-26 NOTE — ED Notes (Addendum)
Pt brought over from by sickle cell clinic, pt does not have sickle cell, pt was seeing PCP for regular appt and had a BP of 182/118.

## 2014-11-26 NOTE — Progress Notes (Signed)
Patient ID: Candace Diaz, female   DOB: 1956-06-03, 58 y.o.   MRN: [id number] Pt here today for follow up visit and on initial evaluation was found to have a BP of 180/112. Repeat BP in both arms was 182/118 in left and 184/112 in right. She has no complaints and states that she has taken all of her medications. However she is unclear about the names, doses and how she is supposed to take her medications.  This patient reports a  a h/o CVA and is at risk of repeat CVA with BP this high. Will refer to ED for evaluation and management.

## 2014-11-26 NOTE — ED Provider Notes (Signed)
CSN: [id number]     Arrival date & time 11/26/14  1513 History   First MD Initiated Contact with Patient 11/26/14 1634     Chief Complaint  Patient presents with  . Hypertension     (Consider location/radiation/quality/duration/timing/severity/associated sxs/prior Treatment) Patient is a 58 y.o. female presenting with hypertension. The history is provided by the patient.  Hypertension This is a chronic problem. The current episode started more than 1 week ago. The problem occurs constantly. The problem has not changed since onset.Pertinent negatives include no chest pain, no abdominal pain, no headaches and no shortness of breath. Nothing aggravates the symptoms. Nothing relieves the symptoms. She has tried nothing for the symptoms. The treatment provided no relief.    Past Medical History  Diagnosis Date  . Hypertension   . Diabetes mellitus    History reviewed. No pertinent past surgical history. Family History  Problem Relation Age of Onset  . Diabetes Brother   . Hypertension Brother   . Diabetes Other   . Hypertension Other    History  Substance Use Topics  . Smoking status: Never Smoker   . Smokeless tobacco: Never Used  . Alcohol Use: No   OB History    No data available     Review of Systems  Constitutional: Negative for fever and fatigue.  HENT: Negative for congestion and drooling.   Eyes: Negative for pain.  Respiratory: Negative for cough and shortness of breath.   Cardiovascular: Negative for chest pain.  Gastrointestinal: Negative for nausea, vomiting, abdominal pain and diarrhea.  Genitourinary: Negative for dysuria and hematuria.  Musculoskeletal: Negative for back pain, gait problem and neck pain.  Skin: Negative for color change.  Neurological: Negative for dizziness and headaches.  Hematological: Negative for adenopathy.  Psychiatric/Behavioral: Negative for behavioral problems.  All other systems reviewed and are negative.     Allergies   Aspirin and Penicillins  Home Medications   Prior to Admission medications   Medication Sig Start Date End Date Taking? Authorizing Provider  ACCU-CHEK AVIVA PLUS test strip USE TO CHECK BLOOD SUGAR TWICE DAILY 11/03/14   Massie MaroonLachina M Hollis, FNP  acetaminophen (TYLENOL) 500 MG tablet Take 1 tablet (500 mg total) by mouth every 6 (six) hours as needed for pain. 05/30/13   Gerhard Munchobert Lockwood, MD  cloNIDine (CATAPRES) 0.3 MG tablet Take 1 tablet (0.3 mg total) by mouth 3 (three) times daily. 09/07/14   Massie MaroonLachina M Hollis, FNP  diltiazem (CARDIZEM) 120 MG tablet TAKE 1 TABLET BY MOUTH THREE TIMES A DAY 11/11/14   Massie MaroonLachina M Hollis, FNP  EASY TOUCH INSULIN SYRINGE 31G X 5/16" 0.5 ML MISC USE TO INJECT INSULIN EVERY DAY 11/11/14   Massie MaroonLachina M Hollis, FNP  furosemide (LASIX) 40 MG tablet Take 1 tablet (40 mg total) by mouth every morning. 11/11/14   Massie MaroonLachina M Hollis, FNP  glipiZIDE (GLUCOTROL) 10 MG tablet Take 1 tablet (10 mg total) by mouth 2 (two) times daily before a meal. 09/07/14   Massie MaroonLachina M Hollis, FNP  hydrALAZINE (APRESOLINE) 25 MG tablet Take 25 mg by mouth every 6 (six) hours.    Historical Provider, MD  hydrochlorothiazide (HYDRODIURIL) 25 MG tablet Take 1 tablet (25 mg total) by mouth every morning. 10/06/14   Massie MaroonLachina M Hollis, FNP  Lancets (ACCU-CHEK MULTICLIX) lancets USE TO CHECK BLOOD SUGAR TWICE DAILY 11/03/14   Massie MaroonLachina M Hollis, FNP  LANTUS 100 UNIT/ML injection INJECT 15 UNITS SUBCUTANEOUSLY AT BEDTIME 11/03/14   Massie MaroonLachina M Hollis, FNP  LANTUS 100  UNIT/ML injection INJECT 15 UNITS SUBCUTANEOUSLY AT BEDTIME 11/11/14   Massie MaroonLachina M Hollis, FNP  potassium chloride SA (K-DUR,KLOR-CON) 20 MEQ tablet TAKE 1 TABLET BY MOUTH EVERY DAY WITH FOOD 11/03/14   Massie MaroonLachina M Hollis, FNP  potassium chloride SA (K-DUR,KLOR-CON) 20 MEQ tablet TAKE 1 TABLET BY MOUTH EVERY DAY WITH FOOD 11/11/14   Massie MaroonLachina M Hollis, FNP  PROVENTIL HFA 108 (90 BASE) MCG/ACT inhaler INHALE 2 PUFFS BY MOUTH FOUR TIMES A DAY AS NEEDED FOR  SHORTNESS OF BREATH 11/11/14   Massie MaroonLachina M Hollis, FNP   BP 184/111 mmHg  Pulse 95  Temp(Src) 98.4 F (36.9 C) (Oral)  Resp 20  SpO2 100% Physical Exam  Constitutional: She is oriented to person, place, and time. She appears well-developed and well-nourished.  HENT:  Head: Normocephalic.  Mouth/Throat: Oropharynx is clear and moist. No oropharyngeal exudate.  Eyes: Conjunctivae and EOM are normal. Pupils are equal, round, and reactive to light.  Neck: Normal range of motion. Neck supple.  Cardiovascular: Normal rate, regular rhythm, normal heart sounds and intact distal pulses.  Exam reveals no gallop and no friction rub.   No murmur heard. Pulmonary/Chest: Effort normal and breath sounds normal. No respiratory distress. She has no wheezes.  Abdominal: Soft. Bowel sounds are normal. There is no tenderness. There is no rebound and no guarding.  Musculoskeletal: Normal range of motion. She exhibits no edema or tenderness.  Neurological: She is alert and oriented to person, place, and time.  alert, oriented x3 speech: normal in context and clarity memory: intact grossly cranial nerves II-XII: intact motor strength: full proximally and distally no involuntary movements or tremors sensation: intact to light touch diffusely  cerebellar: finger-to-nose and heel-to-shin intact gait: normal forwards and backwards  Skin: Skin is warm and dry.  Psychiatric: She has a normal mood and affect. Her behavior is normal.  Nursing note and vitals reviewed.   ED Course  Procedures (including critical care time) Labs Review Labs Reviewed  CBC WITH DIFFERENTIAL - Abnormal; Notable for the following:    Lymphocytes Relative 49 (*)    All other components within normal limits  COMPREHENSIVE METABOLIC PANEL - Abnormal; Notable for the following:    Potassium 3.6 (*)    Glucose, Bld 107 (*)    Total Protein 8.7 (*)    ALT 42 (*)    GFR calc non Af Amer 80 (*)    All other components within normal  limits  CBG MONITORING, ED - Abnormal; Notable for the following:    Glucose-Capillary 111 (*)    All other components within normal limits  TROPONIN I    Imaging Review Ct Head Wo Contrast  11/26/2014   CLINICAL DATA:  Hypertension, blood pressure today 180/112. History of CVA.  EXAM: CT HEAD WITHOUT CONTRAST  TECHNIQUE: Contiguous axial images were obtained from the base of the skull through the vertex without intravenous contrast.  COMPARISON:  09/12/2012  FINDINGS: The brainstem, cerebellum, cerebral peduncles, thalamus, basal ganglia, basilar cisterns, and ventricular system appear within normal limits. No intracranial hemorrhage, mass lesion, or acute CVA.  Poor definition of middle ear structures with possible chronic thickening of the tympanic membranes.  IMPRESSION: 1. No acute intracranial findings. 2. Chronic poor definition of middle ear structures with possible thickening of the tympanic membranes.   Electronically Signed   By: Herbie BaltimoreWalt  Liebkemann M.D.   On: 11/26/2014 17:09     EKG Interpretation   Date/Time:  Thursday November 26 2014 17:40:11 EST Ventricular Rate:  91 PR Interval:  156 QRS Duration: 64 QT Interval:  480 QTC Calculation: 591 R Axis:   30 Text Interpretation:  Sinus rhythm Borderline T abnormalities, diffuse  leads Prolonged QT interval No significant change since last tracing  Confirmed by Greenleigh Kauth  MD, Cynthya Yam ([id number]) on 11/26/2014 5:42:33 PM      MDM   Final diagnoses:  HTN (hypertension)    4:54 PM 58 y.o. female w hx of CVA, HTN, DM who presents with asymptomatic hypertension. The patient has known hypertension and is found to have a blood pressure of 118/112 and being evaluated by her PCP today. The PCP notes states that the patient reported she took her meds appropriately but could not name them. The patient is alert and oriented here. She denies any chest pain, shortness of breath, headache, numbness, or weakness. Her blood pressure here is 180/111.  Per ACEP guidelines on asx HTN, she requires no significant workup but her pcp was concerned about her BP and risk for CVA given her previous CVA. Will get screening labs and imaging. Pt has a normal neurologic exam.   8:04 PM: BP dec approp w/ 1 clonidine. She remains asx. I interpreted/reviewed the labs and/or imaging which were non-contributory.   I have discussed the diagnosis/risks/treatment options with the patient and believe the pt to be eligible for discharge home to follow-up with her pcp for further bp mgmt. We also discussed returning to the ED immediately if new or worsening sx occur. We discussed the sx which are most concerning (e.g., inc bp, weakness, numbness, cp, sob) that necessitate immediate return. Medications administered to the patient during their visit and any new prescriptions provided to the patient are listed below.  Medications given during this visit Medications  cloNIDine (CATAPRES) tablet 0.3 mg (0.3 mg Oral Given 11/26/14 1702)  sodium chloride 0.9 % bolus 500 mL (0 mLs Intravenous Stopped 11/26/14 1846)    New Prescriptions   No medications on file       Purvis Sheffield, MD 11/26/14 2005

## 2014-11-26 NOTE — ED Notes (Signed)
Pt to CT

## 2014-11-30 ENCOUNTER — Telehealth: Payer: Self-pay | Admitting: Internal Medicine

## 2014-11-30 NOTE — Telephone Encounter (Signed)
Patient calling to schedule follow up appointment. Please advise.

## 2014-11-30 NOTE — Telephone Encounter (Signed)
Staff message sent to provider. Thanks!  

## 2014-12-03 ENCOUNTER — Ambulatory Visit (INDEPENDENT_AMBULATORY_CARE_PROVIDER_SITE_OTHER): Payer: Medicaid Other | Admitting: Internal Medicine

## 2014-12-03 VITALS — BP 135/80 | HR 75 | Temp 98.3°F | Resp 18 | Ht 69.0 in | Wt 219.0 lb

## 2014-12-03 DIAGNOSIS — I10 Essential (primary) hypertension: Secondary | ICD-10-CM

## 2014-12-03 DIAGNOSIS — Z23 Encounter for immunization: Secondary | ICD-10-CM | POA: Diagnosis not present

## 2014-12-03 DIAGNOSIS — R0609 Other forms of dyspnea: Secondary | ICD-10-CM

## 2014-12-03 DIAGNOSIS — E1165 Type 2 diabetes mellitus with hyperglycemia: Secondary | ICD-10-CM | POA: Diagnosis not present

## 2014-12-03 DIAGNOSIS — IMO0002 Reserved for concepts with insufficient information to code with codable children: Secondary | ICD-10-CM

## 2014-12-03 DIAGNOSIS — E7800 Pure hypercholesterolemia, unspecified: Secondary | ICD-10-CM

## 2014-12-03 MED ORDER — LISINOPRIL 5 MG PO TABS: 5.0000 mg | ORAL_TABLET | Freq: Every day | ORAL | Status: DC

## 2014-12-03 MED ORDER — PRAVASTATIN SODIUM 40 MG PO TABS: 40.0000 mg | ORAL_TABLET | Freq: Every day | ORAL | Status: DC

## 2014-12-03 NOTE — Progress Notes (Signed)
Patient ID: Candace ChanceZola Diaz, female   DOB: 05/22/1956, 58 y.o.   MRN: [id number] Assessment and Plan:  Hypertension: Pt currently on Clonidine 0.3 mg TID. This medication has a strong propensity for rebound if a dose is missed. Ultimately the goal is to get patient to the lowest possible dose. Additionally patient is not currently on an ACE-I in the setting of DM II. So, will add lisinopril 5 mgand and check BP next week to start weaning dose of Clonidine , monitor blood pressure at home and continue DASH diet.  Reminder to go to the ER if any CP, SOB, nausea, dizziness, severe HA, changes vision/speech, left arm numbness and tingling and jaw pain.  Cholesterol: Continue Pravachol, diet and exercise. Check cholesterol in August.   Diabetes with neurologic complications:[id number]- Pt has had a CVA in the past. SHe is currently on a Statin but not on antiplatelet agent. She has an allergy to Aspirin the reaction of which is unknown. She would be an appropriate candidate for Plavix but she is resistant.  Continue diet and exercise. Check A1C. Last vision examination within the last year.   Vitamin D Def- check level. Not currently on supplementation.  Continue diet and meds as discussed. Further disposition pending results of labs. Discussed med's effects and SE's.   HPI 58 y.o. female  presents for 3 month follow up with hypertension, hyperlipidemia, diabetes and vitamin D. Her blood pressure has been controlled at home, today their BP is BP: 135/80 mmHg She does not workout. She denies chest pain, shortness of breath, dizziness.  She is on cholesterol medication and denies myalgias. Her cholesterol is not at goal. The cholesterol was:  08/14/2014: Cholesterol, Total 192; HDL Cholesterol by NMR 47; LDL (calc) 109*; Triglycerides 182* She has been working on diet and exercise for Diabetes without complications, she is not on bASA due to reports of an allergy she is not on ACE/ARB, and denies symptoms of hyper  or hypoglycemia. Last A1C was: 08/14/2014: Hemoglobin-A1c 10.1* Patient is on Vitamin D supplement. No results found for requested labs within last 365 days.  Current Medications:  Current Outpatient Prescriptions on File Prior to Visit  Medication Sig Dispense Refill  . ACCU-CHEK AVIVA PLUS test strip USE TO CHECK BLOOD SUGAR TWICE DAILY 50 each 1  . acetaminophen (TYLENOL) 500 MG tablet Take 1 tablet (500 mg total) by mouth every 6 (six) hours as needed for pain. 30 tablet 0  . cloNIDine (CATAPRES) 0.3 MG tablet Take 1 tablet (0.3 mg total) by mouth 3 (three) times daily. 90 tablet 3  . diltiazem (CARDIZEM) 120 MG tablet TAKE 1 TABLET BY MOUTH THREE TIMES A DAY 90 tablet 1  . EASY TOUCH INSULIN SYRINGE 31G X 5/16" 0.5 ML MISC USE TO INJECT INSULIN EVERY DAY 100 each 1  . glipiZIDE (GLUCOTROL) 10 MG tablet Take 1 tablet (10 mg total) by mouth 2 (two) times daily before a meal. 60 tablet 3  . hydrALAZINE (APRESOLINE) 25 MG tablet Take 25 mg by mouth every 6 (six) hours.    . hydrochlorothiazide (HYDRODIURIL) 25 MG tablet TAKE 1 TABLET BY MOUTH EVERY MORNING 30 tablet 3  . Lancets (ACCU-CHEK MULTICLIX) lancets USE TO CHECK BLOOD SUGAR TWICE DAILY 100 each 1  . LANTUS 100 UNIT/ML injection INJECT 15 UNITS SUBCUTANEOUSLY AT BEDTIME 1500 mL 1  . potassium chloride SA (K-DUR,KLOR-CON) 20 MEQ tablet TAKE 1 TABLET BY MOUTH EVERY DAY WITH FOOD 30 tablet 1  . PROVENTIL HFA 108 (90 BASE)  MCG/ACT inhaler INHALE 2 PUFFS BY MOUTH FOUR TIMES A DAY AS NEEDED FOR SHORTNESS OF BREATH 1 Inhaler 1   No current facility-administered medications on file prior to visit.   Medical History:  Past Medical History  Diagnosis Date  . Hypertension   . Diabetes mellitus    Allergies:  Allergies  Allergen Reactions  . Aspirin Other (See Comments)    Reaction uknown  . Penicillins Other (See Comments)    Reaction unknown     Review of Systems:  ROS  Family history- Review and unchanged Social history-  Review and unchanged Physical Exam: BP 135/80 mmHg  Pulse 75  Temp(Src) 98.3 F (36.8 C) (Oral)  Resp 18  Ht 5\' 9"  (1.753 m)  Wt 219 lb (99.338 kg)  BMI 32.33 kg/m2 Wt Readings from Last 3 Encounters:  12/03/14 219 lb (99.338 kg)  11/26/14 219 lb (99.338 kg)  08/14/14 220 lb (99.791 kg)   General Appearance: Well nourished, in no apparent distress. Eyes: PERRLA, EOMs, conjunctiva no swelling or erythema Sinuses: No Frontal/maxillary tenderness ENT/Mouth: Ext aud canals clear, TMs without erythema, bulging. No erythema, swelling, or exudate on post pharynx.  Tonsils not swollen or erythematous. Hearing normal.  Neck: Supple, thyroid normal.  Respiratory: Respiratory effort normal, BS equal bilaterally without rales, rhonchi, wheezing or stridor.  Cardio: RRR with no MRGs. Brisk peripheral pulses without edema.  Abdomen: Soft, + BS.  Non tender, no guarding, rebound, hernias, masses. Lymphatics: Non tender without lymphadenopathy.  Musculoskeletal: Full ROM, 5/5 strength, normal gait.  Skin: Warm, dry without rashes, lesions, ecchymosis.  Neuro: Cranial nerves intact. No cerebellar symptoms. Sensation intact.  Psych: Awake and oriented X 3, normal affect, Insight and Judgment appropriate.    Candace Diaz A., MD 11:49 AM [facility]

## 2014-12-03 NOTE — Assessment & Plan Note (Signed)
Pt reports that her BS has been running in the 130's. She has had no episodes of hypoglycemia. Will also check micro albumin today

## 2014-12-03 NOTE — Assessment & Plan Note (Signed)
Pt was seen in the clinic about 1 week ago and had a markedly elevated BP. She was seen in the ED and started on Clonidine 0.3 mg TID. Additionally in review of her medications she is currently on 2 diuretics (HCTZ and Lasix) started by her previous provider. She denies a H/O CHF, orthopnea, PND or pedal edema. Will discontinue Lasix and also obtain an ECHO. Will start on Lisinopril  (indicatred for HTN and DM II) with attempts to wean Clonidine.

## 2014-12-04 LAB — MICROALBUMIN, URINE: Microalb, Ur: 0.4 mg/dL (ref <2.0)

## 2014-12-04 LAB — HEMOGLOBIN A1C
HEMOGLOBIN A1C: 8.1 % — AB (ref <5.7)
MEAN PLASMA GLUCOSE: 186 mg/dL — AB (ref <117)

## 2014-12-14 ENCOUNTER — Ambulatory Visit (INDEPENDENT_AMBULATORY_CARE_PROVIDER_SITE_OTHER): Payer: Medicaid Other | Admitting: Internal Medicine

## 2014-12-14 VITALS — BP 176/108 | HR 65 | Temp 98.0°F | Resp 16 | Ht 69.0 in | Wt 224.0 lb

## 2014-12-14 DIAGNOSIS — T887XXA Unspecified adverse effect of drug or medicament, initial encounter: Secondary | ICD-10-CM

## 2014-12-14 DIAGNOSIS — Z1239 Encounter for other screening for malignant neoplasm of breast: Secondary | ICD-10-CM

## 2014-12-14 DIAGNOSIS — E1165 Type 2 diabetes mellitus with hyperglycemia: Secondary | ICD-10-CM | POA: Diagnosis not present

## 2014-12-14 DIAGNOSIS — I10 Essential (primary) hypertension: Secondary | ICD-10-CM

## 2014-12-14 DIAGNOSIS — IMO0002 Reserved for concepts with insufficient information to code with codable children: Secondary | ICD-10-CM

## 2014-12-14 DIAGNOSIS — T50905A Adverse effect of unspecified drugs, medicaments and biological substances, initial encounter: Secondary | ICD-10-CM

## 2014-12-14 LAB — BASIC METABOLIC PANEL
BUN: 10 mg/dL (ref 6–23)
CHLORIDE: 102 meq/L (ref 96–112)
CO2: 28 meq/L (ref 19–32)
CREATININE: 0.82 mg/dL (ref 0.50–1.10)
Calcium: 9.3 mg/dL (ref 8.4–10.5)
Glucose, Bld: 153 mg/dL — ABNORMAL HIGH (ref 70–99)
Potassium: 3.4 mEq/L — ABNORMAL LOW (ref 3.5–5.3)
Sodium: 139 mEq/L (ref 135–145)

## 2014-12-14 NOTE — Progress Notes (Signed)
Patient ID: Candace Diaz, female   DOB: 1956/02/19, 58 y.o.   MRN: [id number]   Candace Diaz, is a 58 y.o. female  [id number]  NFA:[id number]  DOB - 1956/02/19  CC:  Chief Complaint  Patient presents with  . Follow-up       HPI: Candace Diaz is a 58 y.o. female here today to follow up on HTN. She states that she stopped taking her Clonidine altogether instead of cutting down to BID. She has been compliant with Lisinopril. She denies any symptoms of hypotension or end-organ complaints.  Patient has No headache, No chest pain, No abdominal pain - No Nausea, No new weakness tingling or numbness, No Cough - SOB.  Allergies  Allergen Reactions  . Aspirin Other (See Comments)    Reaction uknown  . Penicillins Other (See Comments)    Reaction unknown   Past Medical History  Diagnosis Date  . Hypertension   . Diabetes mellitus    Current Outpatient Prescriptions on File Prior to Visit  Medication Sig Dispense Refill  . ACCU-CHEK AVIVA PLUS test strip USE TO CHECK BLOOD SUGAR TWICE DAILY 50 each 1  . acetaminophen (TYLENOL) 500 MG tablet Take 1 tablet (500 mg total) by mouth every 6 (six) hours as needed for pain. 30 tablet 0  . diltiazem (CARDIZEM) 120 MG tablet TAKE 1 TABLET BY MOUTH THREE TIMES A DAY 90 tablet 1  . EASY TOUCH INSULIN SYRINGE 31G X 5/16" 0.5 ML MISC USE TO INJECT INSULIN EVERY DAY 100 each 1  . glipiZIDE (GLUCOTROL) 10 MG tablet Take 1 tablet (10 mg total) by mouth 2 (two) times daily before a meal. 60 tablet 3  . hydrALAZINE (APRESOLINE) 25 MG tablet Take 25 mg by mouth every 6 (six) hours.    . hydrochlorothiazide (HYDRODIURIL) 25 MG tablet TAKE 1 TABLET BY MOUTH EVERY MORNING 30 tablet 3  . Lancets (ACCU-CHEK MULTICLIX) lancets USE TO CHECK BLOOD SUGAR TWICE DAILY 100 each 1  . LANTUS 100 UNIT/ML injection INJECT 15 UNITS SUBCUTANEOUSLY AT BEDTIME 1500 mL 1  . lisinopril (PRINIVIL,ZESTRIL) 5 MG tablet Take 1 tablet (5 mg total) by mouth daily. 90 tablet 3  .  potassium chloride SA (K-DUR,KLOR-CON) 20 MEQ tablet TAKE 1 TABLET BY MOUTH EVERY DAY WITH FOOD 30 tablet 1  . pravastatin (PRAVACHOL) 40 MG tablet Take 1 tablet (40 mg total) by mouth daily. 90 tablet 3  . PROVENTIL HFA 108 (90 BASE) MCG/ACT inhaler INHALE 2 PUFFS BY MOUTH FOUR TIMES A DAY AS NEEDED FOR SHORTNESS OF BREATH 1 Inhaler 1  . cloNIDine (CATAPRES) 0.3 MG tablet Take 1 tablet (0.3 mg total) by mouth 3 (three) times daily. (Patient not taking: Reported on 12/14/2014) 90 tablet 3   No current facility-administered medications on file prior to visit.   Family History  Problem Relation Age of Onset  . Diabetes Brother   . Hypertension Brother   . Diabetes Other   . Hypertension Other    History   Social History  . Marital Status: Single    Spouse Name: N/A    Number of Children: N/A  . Years of Education: N/A   Occupational History  . Not on file.   Social History Main Topics  . Smoking status: Never Smoker   . Smokeless tobacco: Never Used  . Alcohol Use: No  . Drug Use: No  . Sexual Activity: Not on file   Other Topics Concern  . Not on file   Social History Narrative  Review of Systems: Constitutional: Negative for fever, chills, diaphoresis, activity change, appetite change and fatigue. HENT: Negative for ear pain, nosebleeds, congestion, facial swelling, rhinorrhea, neck pain, neck stiffness and ear discharge.  Eyes: Negative for pain, discharge, redness, itching and visual disturbance. Respiratory: Negative for cough, choking, chest tightness, shortness of breath, wheezing and stridor.  Cardiovascular: Negative for chest pain, palpitations and leg swelling. Gastrointestinal: Negative for abdominal distention. Genitourinary: Negative for dysuria, urgency, frequency, hematuria, flank pain, decreased urine volume, difficulty urinating and dyspareunia.  Musculoskeletal: Negative for back pain, joint swelling, arthralgia and gait problem. Neurological:  Negative for dizziness, tremors, seizures, syncope, facial asymmetry, speech difficulty, weakness, light-headedness, numbness and headaches.  Hematological: Negative for adenopathy. Does not bruise/bleed easily. Psychiatric/Behavioral: Negative for hallucinations, behavioral problems, confusion, dysphoric mood, decreased concentration and agitation.     Objective:    Filed Vitals:   12/14/14 1308  BP: 176/108  Pulse: 65  Temp: 98 F (36.7 C)  Resp: 16    Physical Exam: Constitutional: Patient appears well-developed and well-nourished. No distress. HENT: Normocephalic, atraumatic, External right and left ear normal. Oropharynx is clear and moist.  Eyes: Conjunctivae and EOM are normal. PERRLA, no scleral icterus. Neck: Normal ROM. Neck supple. No JVD. No tracheal deviation. No thyromegaly. CVS: RRR, S1/S2 +, no murmurs, no gallops, no carotid bruit.  Pulmonary: Effort and breath sounds normal, no stridor, rhonchi, wheezes, rales.  Abdominal: Soft. BS +, no distension, tenderness, rebound or guarding.  Musculoskeletal: Normal range of motion. No edema and no tenderness.  Lymphadenopathy: No lymphadenopathy noted, cervical, inguinal or axillary Neuro: Alert. Normal reflexes, muscle tone coordination. No cranial nerve deficit. Skin: Skin is warm and dry. No rash noted. Not diaphoretic. No erythema. No pallor. Psychiatric: Normal mood and affect. Behavior, judgment, thought content normal. Genitalia: External genitalia normal. Vaginal vault shows healthy tissue on visual inspection. No foul smelling discharge. Pt exhibits good lubrication with physiological discharge. No adnexal tenderness noted however there is a soft tissue mass felt on the anterior left vaginal vault.   Lab Results  Component Value Date   WBC 6.4 11/26/2014   HGB 12.0 11/26/2014   HCT 37.7 11/26/2014   MCV 88.9 11/26/2014   PLT 292 11/26/2014   Lab Results  Component Value Date   CREATININE 0.80 11/26/2014    BUN 16 11/26/2014   NA 144 11/26/2014   K 3.6* 11/26/2014   CL 101 11/26/2014   CO2 30 11/26/2014    Lab Results  Component Value Date   HGBA1C 8.1* 12/03/2014   Lipid Panel     Component Value Date/Time   CHOL 192 08/14/2014 1542   TRIG 182* 08/14/2014 1542   HDL 47 08/14/2014 1542   CHOLHDL 4.1 08/14/2014 1542   VLDL 36 08/14/2014 1542   LDLCALC 109* 08/14/2014 1542       Assessment and plan:   1.HTN (hypertension), malignant - Reviewed medication instructions with patient. She is to resume Clonidine at 0.3 mg BID and continue  Lisinopril.   2. Medication adverse effect, initial encounter - Pt was recently started on Lisinopril. Will check renal function and potassium levels today. - Basic Metabolic Panel  3. Diabetes mellitus type 2, uncontrolled - reviewed Hb A1c. Improved since 3 months ago. Pt to bring in BS log at next visit. -  4. Breast cancer screening - MM DIGITAL SCREENING BILATERAL; Future    Follow-up in 2 weeks for BP  The patient was given clear instructions to go to ER or return  to medical center if symptoms don't improve, worsen or new problems develop. The patient verbalized understanding. The patient was told to call to get lab results if they haven't heard anything in the next week.     This note has been created with Education officer, environmentalDragon speech recognition software and smart phrase technology. Any transcriptional errors are unintentional.    Tamiya Colello A., MD [facility] [place], [place] 9[address] 517 [phone]   12/14/2014, 1:50 PM

## 2014-12-14 NOTE — Assessment & Plan Note (Signed)
Pt misunderstood directions at last visit and instead of taking her Clonidine 2 x day, she stopped it altogether. I have reinforced instruction ands she will continue clonidine 0.3 mg BID and Lisinopril 5 mg daily. If labs okay today will increase Lisinopril to 10 mg.

## 2014-12-16 ENCOUNTER — Other Ambulatory Visit: Payer: Self-pay | Admitting: Family Medicine

## 2014-12-21 ENCOUNTER — Other Ambulatory Visit: Payer: Self-pay | Admitting: Family Medicine

## 2014-12-31 ENCOUNTER — Ambulatory Visit: Payer: Medicaid Other | Admitting: Internal Medicine

## 2015-01-04 ENCOUNTER — Other Ambulatory Visit: Payer: Self-pay | Admitting: Internal Medicine

## 2015-01-07 ENCOUNTER — Ambulatory Visit (INDEPENDENT_AMBULATORY_CARE_PROVIDER_SITE_OTHER): Payer: Medicaid Other | Admitting: Internal Medicine

## 2015-01-07 VITALS — BP 133/92 | HR 70 | Temp 97.7°F | Resp 14 | Ht 69.0 in | Wt 221.0 lb

## 2015-01-07 DIAGNOSIS — I10 Essential (primary) hypertension: Secondary | ICD-10-CM

## 2015-01-07 DIAGNOSIS — E1165 Type 2 diabetes mellitus with hyperglycemia: Secondary | ICD-10-CM

## 2015-01-07 DIAGNOSIS — IMO0002 Reserved for concepts with insufficient information to code with codable children: Secondary | ICD-10-CM

## 2015-01-07 MED ORDER — LISINOPRIL 10 MG PO TABS: 10.0000 mg | ORAL_TABLET | Freq: Every day | ORAL | Status: DC

## 2015-01-07 NOTE — Progress Notes (Signed)
Patient ID: Candace ChanceZola Diaz, female   DOB: 05/25/1956, 59 y.o.   MRN: [id number]  CC: Here to follow up on BP  HPI: Pt with HTN having medications titrated to wean off Clonidine and institute more appropriate medications to improve BP. However she does not have her medications with her and is unsure what medications she is taking and with what frequency. He BP today is much improved although the diastolic is still elevated.  She denies any dizziness or orthostatic symptoms.   Allergies  Allergen Reactions  . Aspirin Other (See Comments)    Reaction uknown  . Penicillins Other (See Comments)    Reaction unknown   Past Medical History  Diagnosis Date  . Hypertension   . Diabetes mellitus    Current Outpatient Prescriptions on File Prior to Visit  Medication Sig Dispense Refill  . ACCU-CHEK AVIVA PLUS test strip USE TO CHECK BLOOD SUGAR TWICE DAILY 50 each 5  . acetaminophen (TYLENOL) 500 MG tablet Take 1 tablet (500 mg total) by mouth every 6 (six) hours as needed for pain. 30 tablet 0  . diltiazem (CARDIZEM) 120 MG tablet TAKE 1 TABLET BY MOUTH THREE TIMES A DAY 90 tablet 1  . EASY TOUCH INSULIN SYRINGE 31G X 5/16" 0.5 ML MISC USE TO INJECT INSULIN EVERY DAY 100 each 1  . glipiZIDE (GLUCOTROL) 10 MG tablet TAKE 1 TABLET BY MOUTH TWICE DAILY BEFORE A MEAL 60 tablet 3  . hydrALAZINE (APRESOLINE) 25 MG tablet Take 25 mg by mouth every 6 (six) hours.    . hydrochlorothiazide (HYDRODIURIL) 25 MG tablet TAKE 1 TABLET BY MOUTH EVERY MORNING 30 tablet 3  . hydrochlorothiazide (HYDRODIURIL) 25 MG tablet TAKE 1 TABLET BY MOUTH EVERY MORNING 30 tablet 1  . Lancets (ACCU-CHEK MULTICLIX) lancets USE TO CHECK BLOOD SUGAR TWICE DAILY 100 each 1  . LANTUS 100 UNIT/ML injection INJECT 15 UNITS SUBCUTANEOUSLY AT BEDTIME 1500 mL 1  . potassium chloride SA (K-DUR,KLOR-CON) 20 MEQ tablet TAKE 1 TABLET BY MOUTH EVERY DAY WITH FOOD 30 tablet 1  . pravastatin (PRAVACHOL) 40 MG tablet Take 1 tablet (40 mg total) by  mouth daily. 90 tablet 3  . PROVENTIL HFA 108 (90 BASE) MCG/ACT inhaler INHALE 2 PUFFS BY MOUTH FOUR TIMES A DAY AS NEEDED FOR SHORTNESS OF BREATH 1 Inhaler 1  . cloNIDine (CATAPRES) 0.3 MG tablet Take 1 tablet (0.3 mg total) by mouth 3 (three) times daily. (Patient not taking: Reported on 01/07/2015) 90 tablet 3   No current facility-administered medications on file prior to visit.   Family History  Problem Relation Age of Onset  . Diabetes Brother   . Hypertension Brother   . Diabetes Other   . Hypertension Other    History   Social History  . Marital Status: Single    Spouse Name: N/A    Number of Children: N/A  . Years of Education: N/A   Occupational History  . Not on file.   Social History Main Topics  . Smoking status: Never Smoker   . Smokeless tobacco: Never Used  . Alcohol Use: No  . Drug Use: No  . Sexual Activity: Not on file   Other Topics Concern  . Not on file   Social History Narrative    Review of Systems: Constitutional: Negative for fever, chills, diaphoresis, activity change, appetite change and fatigue. HENT: Negative for ear pain, nosebleeds, congestion, facial swelling, rhinorrhea, neck pain, neck stiffness and ear discharge.  Eyes: Negative for pain, discharge, redness,  itching and visual disturbance. Respiratory: Negative for cough, choking, chest tightness, shortness of breath, wheezing and stridor.  Cardiovascular: Negative for chest pain, palpitations and leg swelling. Gastrointestinal: Negative for abdominal distention. Genitourinary: Negative for dysuria, urgency, frequency, hematuria, flank pain, decreased urine volume, difficulty urinating and dyspareunia.  Musculoskeletal: Negative for back pain, joint swelling, arthralgias and gait problem. Neurological: Negative for dizziness, tremors, seizures, syncope, facial asymmetry, speech difficulty, weakness, light-headedness, numbness and headaches.  Hematological: Negative for adenopathy. Does  not bruise/bleed easily. Psychiatric/Behavioral: Negative for hallucinations, behavioral problems, confusion, dysphoric mood, decreased concentration and agitation.    Objective:   Filed Vitals:   01/07/15 1118  BP: 133/92  Pulse: 70  Temp: 97.7 F (36.5 C)  Resp: 14    Physical Exam: Constitutional: Patient appears well-developed and well-nourished. No distress. HENT: Normocephalic, atraumatic, External right and left ear normal. Oropharynx is clear and moist.  Eyes: Conjunctivae and EOM are normal. PERRLA, no scleral icterus. Neck: Normal ROM. Neck supple. No JVD. No tracheal deviation. No thyromegaly. CVS: RRR, S1/S2 +, no murmurs, no gallops, no carotid bruit.  Pulmonary: Effort and breath sounds normal, no stridor, rhonchi, wheezes, rales.  Abdominal: Soft. BS +,  no distension, tenderness, rebound or guarding.  Musculoskeletal: Normal range of motion. No edema and no tenderness.  Lymphadenopathy: No lymphadenopathy noted, cervical, inguinal or axillary Neuro: Alert. Normal reflexes, muscle tone coordination. No cranial nerve deficit. Skin: Skin is warm and dry. No rash noted. Not diaphoretic. No erythema. No pallor. Psychiatric: Normal mood and affect. Behavior, judgment, thought content normal.  Lab Results  Component Value Date   WBC 6.4 11/26/2014   HGB 12.0 11/26/2014   HCT 37.7 11/26/2014   MCV 88.9 11/26/2014   PLT 292 11/26/2014   Lab Results  Component Value Date   CREATININE 0.82 12/14/2014   BUN 10 12/14/2014   NA 139 12/14/2014   K 3.4* 12/14/2014   CL 102 12/14/2014   CO2 28 12/14/2014    Lab Results  Component Value Date   HGBA1C 8.1* 12/03/2014   Lipid Panel     Component Value Date/Time   CHOL 192 08/14/2014 1542   TRIG 182* 08/14/2014 1542   HDL 47 08/14/2014 1542   CHOLHDL 4.1 08/14/2014 1542   VLDL 36 08/14/2014 1542   LDLCALC 109* 08/14/2014 1542       Assessment and plan:   Patient Active Problem List   Diagnosis Date Noted   . HTN (hypertension), malignant 12/03/2014  . DOE (dyspnea on exertion) 12/03/2014  . Acute serous otitis media 08/20/2014  . Essential hypertension, malignant 08/20/2014  . Other and unspecified hyperlipidemia 08/20/2014  . Diabetes mellitus type 2, uncontrolled 08/20/2014  . Immunization due 08/20/2014  . Need for Tdap vaccination 08/20/2014  . History of palpitations 08/20/2014  . History of CVA with residual deficit 08/20/2014  . Arthritis of right knee 08/20/2014    PLAN: 1. HTN (hypertension), malignant - Will increase Lisinopril to 10 mg daily. She has been instructed to take Clonidine 0.3 mg on a BID basis. - Pt to bring her medications in next week to be reconciled as she is unsure what she is taking. - lisinopril (PRINIVIL,ZESTRIL) 10 MG tablet; Take 1 tablet (10 mg total) by mouth daily.  Dispense: 30 tablet; Refill: 1  2. Diabetes mellitus type 2, uncontrolled - Hb A1c fron 1 month ago was 8.1 which is decreased from 10.1. - Pt reports blood sugars fasting <110 and post-prandial around 130. - Continue current dose  of Lantus and Glipizide. - lisinopril (PRINIVIL,ZESTRIL) 10 MG tablet; Take 1 tablet (10 mg total) by mouth daily.  Dispense: 30 tablet; Refill: 1   Chenoa Luddy A., MD [facility] [place], [place] [phone]  01/07/2015, 12:15 PM

## 2015-01-27 LAB — HM DIABETES EYE EXAM

## 2015-02-11 ENCOUNTER — Encounter: Payer: Self-pay | Admitting: Internal Medicine

## 2015-02-11 ENCOUNTER — Ambulatory Visit (INDEPENDENT_AMBULATORY_CARE_PROVIDER_SITE_OTHER): Payer: Medicaid Other | Admitting: Internal Medicine

## 2015-02-11 VITALS — BP 143/76 | HR 65 | Temp 97.6°F | Resp 16 | Ht 69.0 in | Wt 221.0 lb

## 2015-02-11 DIAGNOSIS — E559 Vitamin D deficiency, unspecified: Secondary | ICD-10-CM

## 2015-02-11 DIAGNOSIS — E1165 Type 2 diabetes mellitus with hyperglycemia: Secondary | ICD-10-CM

## 2015-02-11 DIAGNOSIS — IMO0002 Reserved for concepts with insufficient information to code with codable children: Secondary | ICD-10-CM

## 2015-02-11 DIAGNOSIS — Z0000 Encounter for general adult medical examination without abnormal findings: Secondary | ICD-10-CM

## 2015-02-11 DIAGNOSIS — I10 Essential (primary) hypertension: Secondary | ICD-10-CM

## 2015-02-11 MED ORDER — AMLODIPINE BESYLATE 5 MG PO TABS: 5.0000 mg | ORAL_TABLET | Freq: Every day | ORAL | Status: DC

## 2015-02-11 NOTE — Progress Notes (Signed)
Patient ID: Candace Diaz, female   DOB: 12-Feb-1956, 59 y.o.   MRN: [id number]   Candace Diaz, is a 59 y.o. female  WUJ:[id number]  NFA:[id number]  DOB - Jan 17, 1956  CC:  Chief Complaint  Patient presents with  . Hypertension       HPI: Candace Diaz is a 58 y.o. female here today to follow up on HTN. She was unsure of the medications that she shoulsd be taking during her last visit and we reviewed them at that time and gave specific instructions. Today she has brought in her medications and we have reviewed them. She continues to take Clonidine TID as well as Hydralazine TID. Patient has No headache, No chest pain, No abdominal pain - No Nausea, No new weakness tingling or numbness, No Cough - SOB.  Allergies  Allergen Reactions  . Aspirin Other (See Comments)    Reaction uknown  . Penicillins Other (See Comments)    Reaction unknown   Past Medical History  Diagnosis Date  . Hypertension   . Diabetes mellitus    Current Outpatient Prescriptions on File Prior to Visit  Medication Sig Dispense Refill  . ACCU-CHEK AVIVA PLUS test strip USE TO CHECK BLOOD SUGAR TWICE DAILY 50 each 5  . acetaminophen (TYLENOL) 500 MG tablet Take 1 tablet (500 mg total) by mouth every 6 (six) hours as needed for pain. 30 tablet 0  . cloNIDine (CATAPRES) 0.3 MG tablet Take 1 tablet (0.3 mg total) by mouth 3 (three) times daily. 90 tablet 3  . diltiazem (CARDIZEM) 120 MG tablet TAKE 1 TABLET BY MOUTH THREE TIMES A DAY 90 tablet 1  . EASY TOUCH INSULIN SYRINGE 31G X 5/16" 0.5 ML MISC USE TO INJECT INSULIN EVERY DAY 100 each 1  . glipiZIDE (GLUCOTROL) 10 MG tablet TAKE 1 TABLET BY MOUTH TWICE DAILY BEFORE A MEAL 60 tablet 3  . hydrALAZINE (APRESOLINE) 25 MG tablet Take 25 mg by mouth 3 (three) times daily.     . hydrochlorothiazide (HYDRODIURIL) 25 MG tablet TAKE 1 TABLET BY MOUTH EVERY MORNING 30 tablet 3  . hydrochlorothiazide (HYDRODIURIL) 25 MG tablet TAKE 1 TABLET BY MOUTH EVERY MORNING 30 tablet 1  .  Lancets (ACCU-CHEK MULTICLIX) lancets USE TO CHECK BLOOD SUGAR TWICE DAILY 100 each 1  . LANTUS 100 UNIT/ML injection INJECT 15 UNITS SUBCUTANEOUSLY AT BEDTIME 1500 mL 1  . lisinopril (PRINIVIL,ZESTRIL) 10 MG tablet Take 1 tablet (10 mg total) by mouth daily. 30 tablet 1  . potassium chloride SA (K-DUR,KLOR-CON) 20 MEQ tablet TAKE 1 TABLET BY MOUTH EVERY DAY WITH FOOD 30 tablet 1  . pravastatin (PRAVACHOL) 40 MG tablet Take 1 tablet (40 mg total) by mouth daily. 90 tablet 3  . PROVENTIL HFA 108 (90 BASE) MCG/ACT inhaler INHALE 2 PUFFS BY MOUTH FOUR TIMES A DAY AS NEEDED FOR SHORTNESS OF BREATH 1 Inhaler 1   No current facility-administered medications on file prior to visit.   Family History  Problem Relation Age of Onset  . Diabetes Brother   . Hypertension Brother   . Diabetes Other   . Hypertension Other    History   Social History  . Marital Status: Single    Spouse Name: N/A  . Number of Children: N/A  . Years of Education: N/A   Occupational History  . Not on file.   Social History Main Topics  . Smoking status: Never Smoker   . Smokeless tobacco: Never Used  . Alcohol Use: No  . Drug Use:  No  . Sexual Activity: Not on file   Other Topics Concern  . Not on file   Social History Narrative    Review of Systems: Constitutional: Negative for fever, chills, diaphoresis, activity change, appetite change and fatigue. HENT: Negative for ear pain, nosebleeds, congestion, facial swelling, rhinorrhea, neck pain, neck stiffness and ear discharge.  Eyes: Negative for pain, discharge, redness, itching and visual disturbance. Respiratory: Negative for cough, choking, chest tightness, shortness of breath, wheezing and stridor.  Cardiovascular: Negative for chest pain, palpitations and leg swelling. Gastrointestinal: Negative for abdominal distention. Genitourinary: Negative for dysuria, urgency, frequency, hematuria, flank pain, decreased urine volume, difficulty urinating and  dyspareunia.  Musculoskeletal: Negative for back pain, joint swelling, arthralgia and gait problem. Neurological: Negative for dizziness, tremors, seizures, syncope, facial asymmetry, speech difficulty, weakness, light-headedness, numbness and headaches.  Hematological: Negative for adenopathy. Does not bruise/bleed easily. Psychiatric/Behavioral: Negative for hallucinations, behavioral problems, confusion, dysphoric mood, decreased concentration and agitation.     Objective:    Filed Vitals:   02/11/15 1004  BP: 143/76  Pulse: 65  Temp: 97.6 F (36.4 C)  Resp: 16    Physical Exam: Constitutional: Patient appears well-developed and well-nourished. No distress. HENT: Normocephalic, atraumatic, External right and left ear normal. Oropharynx is clear and moist.  Eyes: Conjunctivae and EOM are normal. PERRLA, no scleral icterus. Neck: Normal ROM. Neck supple. No JVD. No tracheal deviation. No thyromegaly. CVS: RRR, S1/S2 +, no murmurs, no gallops, no carotid bruit.  Pulmonary: Effort and breath sounds normal, no stridor, rhonchi, wheezes, rales.  Abdominal: Soft. BS +, no distension, tenderness, rebound or guarding.  Musculoskeletal: Normal range of motion. No edema and no tenderness.  Lymphadenopathy: No lymphadenopathy noted, cervical, inguinal or axillary Neuro: Alert. Normal reflexes, muscle tone coordination. No cranial nerve deficit. Skin: Skin is warm and dry. No rash noted. Not diaphoretic. No erythema. No pallor. Psychiatric: Normal mood and affect. Behavior, judgment, thought content normal.   Lab Results  Component Value Date   WBC 6.4 11/26/2014   HGB 12.0 11/26/2014   HCT 37.7 11/26/2014   MCV 88.9 11/26/2014   PLT 292 11/26/2014   Lab Results  Component Value Date   CREATININE 0.82 12/14/2014   BUN 10 12/14/2014   NA 139 12/14/2014   K 3.4* 12/14/2014   CL 102 12/14/2014   CO2 28 12/14/2014    Lab Results  Component Value Date   HGBA1C 8.1* 12/03/2014    Lipid Panel     Component Value Date/Time   CHOL 192 08/14/2014 1542   TRIG 182* 08/14/2014 1542   HDL 47 08/14/2014 1542   CHOLHDL 4.1 08/14/2014 1542   VLDL 36 08/14/2014 1542   LDLCALC 109* 08/14/2014 1542       Assessment and plan:   1. HTN (hypertension), malignant - Pt is on multiple anti-hypretensive medications including Clonidine andydralaine both of whichare associated with significant rebound and offer no significant decrease in mortality beyond lowering of BP. My plan is to transition from clonidine and Hydralazine and transition to Norvasc while maximizing ACE-I. If this does not off give adequate BP control then will change to ARB to get benefits from the endothilial  - amLODipine (NORVASC) 5 MG tablet; Take 1 tablet (5 mg total) by mouth daily.  Dispense: 90 tablet; Refill: 3  2. Diabetes mellitus type 2, uncontrolled - DM improved by Hb A1c but still not well controlled. Pt  Reports that her fasting BP has been close to 200 at times. I  will increase Lantus to 20 units and she is to check her BS fasting and post prandial for 2 weeks and bring them in for review. - amLODipine (NORVASC) 5 MG tablet; Take 1 tablet (5 mg total) by mouth daily.  Dispense: 90 tablet; Refill: 3   Return in about 2 weeks (around 02/25/2015) for Annual Physical, HTN.  The patient was given clear instructions to go to ER or return to medical center if symptoms don't improve, worsen or new problems develop. The patient verbalized understanding. The patient was told to call to get lab results if they haven't heard anything in the next week.     This note has been created with Education officer, environmentalDragon speech recognition software and smart phrase technology. Any transcriptional errors are unintentional.    MATTHEWS,MICHELLE A., MD [facility] [place], [place] [phone]   02/11/2015, 10:37 AM

## 2015-02-11 NOTE — Assessment & Plan Note (Signed)
Pt taking multiple medications s noted above. I would preferm that she use Norvasc instead of Hydralazine and we can also increase her Lisinopril asit is at a low dose. I am concerned about the rebound of both Clonidine and Hydralazine which do not improve mortality associated with Candace Diaz's comorbidities. Will  Decrease Hydralzine to BID and add Norvasc 5 mg Daily.

## 2015-02-18 ENCOUNTER — Other Ambulatory Visit: Payer: Self-pay | Admitting: Family Medicine

## 2015-02-18 ENCOUNTER — Other Ambulatory Visit: Payer: Self-pay | Admitting: Internal Medicine

## 2015-02-19 ENCOUNTER — Ambulatory Visit (INDEPENDENT_AMBULATORY_CARE_PROVIDER_SITE_OTHER): Payer: Medicaid Other

## 2015-02-19 ENCOUNTER — Other Ambulatory Visit: Payer: Medicaid Other

## 2015-02-19 DIAGNOSIS — Z0000 Encounter for general adult medical examination without abnormal findings: Secondary | ICD-10-CM

## 2015-02-19 DIAGNOSIS — E559 Vitamin D deficiency, unspecified: Secondary | ICD-10-CM

## 2015-02-19 DIAGNOSIS — I10 Essential (primary) hypertension: Secondary | ICD-10-CM

## 2015-02-19 DIAGNOSIS — E1165 Type 2 diabetes mellitus with hyperglycemia: Secondary | ICD-10-CM

## 2015-02-19 DIAGNOSIS — IMO0002 Reserved for concepts with insufficient information to code with codable children: Secondary | ICD-10-CM

## 2015-02-19 LAB — LIPID PANEL
CHOL/HDL RATIO: 3.3 ratio
Cholesterol: 139 mg/dL (ref 0–200)
HDL: 42 mg/dL — AB (ref >=46)
LDL Cholesterol: 74 mg/dL (ref 0–99)
TRIGLYCERIDES: 113 mg/dL (ref <150)
VLDL: 23 mg/dL (ref 0–40)

## 2015-02-20 LAB — VITAMIN D 25 HYDROXY (VIT D DEFICIENCY, FRACTURES): VIT D 25 HYDROXY: 10 ng/mL — AB (ref 30–100)

## 2015-02-25 ENCOUNTER — Other Ambulatory Visit: Payer: Self-pay | Admitting: Internal Medicine

## 2015-02-25 DIAGNOSIS — Z1231 Encounter for screening mammogram for malignant neoplasm of breast: Secondary | ICD-10-CM

## 2015-02-25 DIAGNOSIS — Z1239 Encounter for other screening for malignant neoplasm of breast: Secondary | ICD-10-CM

## 2015-03-01 ENCOUNTER — Ambulatory Visit (INDEPENDENT_AMBULATORY_CARE_PROVIDER_SITE_OTHER): Payer: Medicaid Other | Admitting: Family Medicine

## 2015-03-01 ENCOUNTER — Other Ambulatory Visit (HOSPITAL_COMMUNITY)
Admission: RE | Admit: 2015-03-01 | Discharge: 2015-03-01 | Disposition: A | Discharge status: Home / Self Care | Payer: Medicaid Other | Source: Ambulatory Visit | Attending: Family Medicine | Admitting: Family Medicine

## 2015-03-01 VITALS — BP 117/71 | HR 76 | Temp 97.7°F | Resp 16 | Ht 69.0 in | Wt 223.0 lb

## 2015-03-01 DIAGNOSIS — E1165 Type 2 diabetes mellitus with hyperglycemia: Secondary | ICD-10-CM

## 2015-03-01 DIAGNOSIS — Z0000 Encounter for general adult medical examination without abnormal findings: Secondary | ICD-10-CM

## 2015-03-01 DIAGNOSIS — Z01419 Encounter for gynecological examination (general) (routine) without abnormal findings: Secondary | ICD-10-CM | POA: Insufficient documentation

## 2015-03-01 LAB — CBC WITH DIFFERENTIAL/PLATELET
BASOS ABS: 0.1 10*3/uL (ref 0.0–0.1)
Basophils Relative: 1 % (ref 0–1)
Eosinophils Absolute: 0.2 10*3/uL (ref 0.0–0.7)
Eosinophils Relative: 2 % (ref 0–5)
HCT: 37 % (ref 36.0–46.0)
Hemoglobin: 12 g/dL (ref 12.0–15.0)
Lymphocytes Relative: 47 % — ABNORMAL HIGH (ref 12–46)
Lymphs Abs: 3.8 10*3/uL (ref 0.7–4.0)
MCH: 28.8 pg (ref 26.0–34.0)
MCHC: 32.4 g/dL (ref 30.0–36.0)
MCV: 88.7 fL (ref 78.0–100.0)
MONOS PCT: 4 % (ref 3–12)
MPV: 11 fL (ref 8.6–12.4)
Monocytes Absolute: 0.3 10*3/uL (ref 0.1–1.0)
Neutro Abs: 3.7 10*3/uL (ref 1.7–7.7)
Neutrophils Relative %: 46 % (ref 43–77)
PLATELETS: 357 10*3/uL (ref 150–400)
RBC: 4.17 MIL/uL (ref 3.87–5.11)
RDW: 13.4 % (ref 11.5–15.5)
WBC: 8.1 10*3/uL (ref 4.0–10.5)

## 2015-03-01 LAB — COMPLETE METABOLIC PANEL WITH GFR
ALT: 29 U/L (ref 0–35)
AST: 23 U/L (ref 0–37)
Albumin: 4.5 g/dL (ref 3.5–5.2)
Alkaline Phosphatase: 69 U/L (ref 39–117)
BUN: 12 mg/dL (ref 6–23)
CALCIUM: 9.6 mg/dL (ref 8.4–10.5)
CO2: 26 meq/L (ref 19–32)
Chloride: 99 mEq/L (ref 96–112)
Creat: 0.78 mg/dL (ref 0.50–1.10)
GFR, Est Non African American: 85 mL/min
Glucose, Bld: 224 mg/dL — ABNORMAL HIGH (ref 70–99)
Potassium: 3.5 mEq/L (ref 3.5–5.3)
SODIUM: 134 meq/L — AB (ref 135–145)
TOTAL PROTEIN: 8.1 g/dL (ref 6.0–8.3)
Total Bilirubin: 0.4 mg/dL (ref 0.2–1.2)

## 2015-03-01 LAB — TSH: TSH: 1.84 u[IU]/mL (ref 0.350–4.500)

## 2015-03-01 NOTE — Patient Instructions (Signed)
Health Maintenance Adopting a healthy lifestyle and getting preventive care can go a long way to promote health and wellness. Talk with your health care provider about what schedule of regular examinations is right for you. This is a good chance for you to check in with your provider about disease prevention and staying healthy. In between checkups, there are plenty of things you can do on your own. Experts have done a lot of research about which lifestyle changes and preventive measures are most likely to keep you healthy. Ask your health care provider for more information. WEIGHT AND DIET  Eat a healthy diet  Be sure to include plenty of vegetables, fruits, low-fat dairy products, and lean protein.  Do not eat a lot of foods high in solid fats, added sugars, or salt.  Get regular exercise. This is one of the most important things you can do for your health.  Most adults should exercise for at least 150 minutes each week. The exercise should increase your heart rate and make you sweat (moderate-intensity exercise).  Most adults should also do strengthening exercises at least twice a week. This is in addition to the moderate-intensity exercise.  Maintain a healthy weight  Body mass index (BMI) is a measurement that can be used to identify possible weight problems. It estimates body fat based on height and weight. Your health care provider can help determine your BMI and help you achieve or maintain a healthy weight.  For females 25 years of age and older:   A BMI below 18.5 is considered underweight.  A BMI of 18.5 to 24.9 is normal.  A BMI of 25 to 29.9 is considered overweight.  A BMI of 30 and above is considered obese.  Watch levels of cholesterol and blood lipids  You should start having your blood tested for lipids and cholesterol at 58 years of age, then have this test every 5 years.  You may need to have your cholesterol levels checked more often if:  Your lipid or  cholesterol levels are high.  You are older than 58 years of age.  You are at high risk for heart disease.  CANCER SCREENING   Lung Cancer  Lung cancer screening is recommended for adults 97-92 years old who are at high risk for lung cancer because of a history of smoking.  A yearly low-dose CT scan of the lungs is recommended for people who:  Currently smoke.  Have quit within the past 15 years.  Have at least a 30-pack-year history of smoking. A pack year is smoking an average of one pack of cigarettes a day for 1 year.  Yearly screening should continue until it has been 15 years since you quit.  Yearly screening should stop if you develop a health problem that would prevent you from having lung cancer treatment.  Breast Cancer  Practice breast self-awareness. This means understanding how your breasts normally appear and feel.  It also means doing regular breast self-exams. Let your health care provider know about any changes, no matter how small.  If you are in your 20s or 30s, you should have a clinical breast exam (CBE) by a health care provider every 1-3 years as part of a regular health exam.  If you are 76 or older, have a CBE every year. Also consider having a breast X-ray (mammogram) every year.  If you have a family history of breast cancer, talk to your health care provider about genetic screening.  If you are  at high risk for breast cancer, talk to your health care provider about having an MRI and a mammogram every year.  Breast cancer gene (BRCA) assessment is recommended for women who have family members with BRCA-related cancers. BRCA-related cancers include:  Breast.  Ovarian.  Tubal.  Peritoneal cancers.  Results of the assessment will determine the need for genetic counseling and BRCA1 and BRCA2 testing. Cervical Cancer Routine pelvic examinations to screen for cervical cancer are no longer recommended for nonpregnant women who are considered low  risk for cancer of the pelvic organs (ovaries, uterus, and vagina) and who do not have symptoms. A pelvic examination may be necessary if you have symptoms including those associated with pelvic infections. Ask your health care provider if a screening pelvic exam is right for you.   The Pap test is the screening test for cervical cancer for women who are considered at risk.  If you had a hysterectomy for a problem that was not cancer or a condition that could lead to cancer, then you no longer need Pap tests.  If you are older than 65 years, and you have had normal Pap tests for the past 10 years, you no longer need to have Pap tests.  If you have had past treatment for cervical cancer or a condition that could lead to cancer, you need Pap tests and screening for cancer for at least 20 years after your treatment.  If you no longer get a Pap test, assess your risk factors if they change (such as having a new sexual partner). This can affect whether you should start being screened again.  Some women have medical problems that increase their chance of getting cervical cancer. If this is the case for you, your health care provider may recommend more frequent screening and Pap tests.  The human papillomavirus (HPV) test is another test that may be used for cervical cancer screening. The HPV test looks for the virus that can cause cell changes in the cervix. The cells collected during the Pap test can be tested for HPV.  The HPV test can be used to screen women 30 years of age and older. Getting tested for HPV can extend the interval between normal Pap tests from three to five years.  An HPV test also should be used to screen women of any age who have unclear Pap test results.  After 58 years of age, women should have HPV testing as often as Pap tests.  Colorectal Cancer  This type of cancer can be detected and often prevented.  Routine colorectal cancer screening usually begins at 58 years of  age and continues through 58 years of age.  Your health care provider may recommend screening at an earlier age if you have risk factors for colon cancer.  Your health care provider may also recommend using home test kits to check for hidden blood in the stool.  A small camera at the end of a tube can be used to examine your colon directly (sigmoidoscopy or colonoscopy). This is done to check for the earliest forms of colorectal cancer.  Routine screening usually begins at age 50.  Direct examination of the colon should be repeated every 5-10 years through 58 years of age. However, you may need to be screened more often if early forms of precancerous polyps or small growths are found. Skin Cancer  Check your skin from head to toe regularly.  Tell your health care provider about any new moles or changes in   moles, especially if there is a change in a mole's shape or color.  Also tell your health care provider if you have a mole that is larger than the size of a pencil eraser.  Always use sunscreen. Apply sunscreen liberally and repeatedly throughout the day.  Protect yourself by wearing long sleeves, pants, a wide-brimmed hat, and sunglasses whenever you are outside. HEART DISEASE, DIABETES, AND HIGH BLOOD PRESSURE   Have your blood pressure checked at least every 1-2 years. High blood pressure causes heart disease and increases the risk of stroke.  If you are between 75 years and 42 years old, ask your health care provider if you should take aspirin to prevent strokes.  Have regular diabetes screenings. This involves taking a blood sample to check your fasting blood sugar level.  If you are at a normal weight and have a low risk for diabetes, have this test once every three years after 58 years of age.  If you are overweight and have a high risk for diabetes, consider being tested at a younger age or more often. PREVENTING INFECTION  Hepatitis B  If you have a higher risk for  hepatitis B, you should be screened for this virus. You are considered at high risk for hepatitis B if:  You were born in a country where hepatitis B is common. Ask your health care provider which countries are considered high risk.  Your parents were born in a high-risk country, and you have not been immunized against hepatitis B (hepatitis B vaccine).  You have HIV or AIDS.  You use needles to inject street drugs.  You live with someone who has hepatitis B.  You have had sex with someone who has hepatitis B.  You get hemodialysis treatment.  You take certain medicines for conditions, including cancer, organ transplantation, and autoimmune conditions. Hepatitis C  Blood testing is recommended for:  Everyone born from [birth year] through 1964.  Anyone with known risk factors for hepatitis C. Sexually transmitted infections (STIs)  You should be screened for sexually transmitted infections (STIs) including gonorrhea and chlamydia if:  You are sexually active and are younger than 58 years of age.  You are older than 58 years of age and your health care provider tells you that you are at risk for this type of infection.  Your sexual activity has changed since you were last screened and you are at an increased risk for chlamydia or gonorrhea. Ask your health care provider if you are at risk.  If you do not have HIV, but are at risk, it may be recommended that you take a prescription medicine daily to prevent HIV infection. This is called pre-exposure prophylaxis (PrEP). You are considered at risk if:  You are sexually active and do not regularly use condoms or know the HIV status of your partner(s).  You take drugs by injection.  You are sexually active with a partner who has HIV. Talk with your health care provider about whether you are at high risk of being infected with HIV. If you choose to begin PrEP, you should first be tested for HIV. You should then be tested every 3 months for  as long as you are taking PrEP.  PREGNANCY   If you are premenopausal and you may become pregnant, ask your health care provider about preconception counseling.  If you may become pregnant, take 400 to 800 micrograms (mcg) of folic acid every day.  If you want to prevent pregnancy, talk to your  health care provider about birth control (contraception). OSTEOPOROSIS AND MENOPAUSE   Osteoporosis is a disease in which the bones lose minerals and strength with aging. This can result in serious bone fractures. Your risk for osteoporosis can be identified using a bone density scan.  If you are 77 years of age or older, or if you are at risk for osteoporosis and fractures, ask your health care provider if you should be screened.  Ask your health care provider whether you should take a calcium or vitamin D supplement to lower your risk for osteoporosis.  Menopause may have certain physical symptoms and risks.  Hormone replacement therapy may reduce some of these symptoms and risks. Talk to your health care provider about whether hormone replacement therapy is right for you.  HOME CARE INSTRUCTIONS   Schedule regular health, dental, and eye exams.  Stay current with your immunizations.   Do not use any tobacco products including cigarettes, chewing tobacco, or electronic cigarettes.  If you are pregnant, do not drink alcohol.  If you are breastfeeding, limit how much and how often you drink alcohol.  Limit alcohol intake to no more than 1 drink per day for nonpregnant women. One drink equals 12 ounces of beer, 5 ounces of wine, or 1 ounces of hard liquor.  Do not use street drugs.  Do not share needles.  Ask your health care provider for help if you need support or information about quitting drugs.  Tell your health care provider if you often feel depressed.  Tell your health care provider if you have ever been abused or do not feel safe at home. Document Released: 06/26/2011  Document Revised: 04/27/2014 Document Reviewed: 11/12/2013 [facility] Patient Information 2015 [place], [place]. This information is not intended to replace advice given to you by your health care provider. Make sure you discuss any questions you have with your health care provider. Health Maintenance Adopting a healthy lifestyle and getting preventive care can go a long way to promote health and wellness. Talk with your health care provider about what schedule of regular examinations is right for you. This is a good chance for you to check in with your provider about disease prevention and staying healthy. In between checkups, there are plenty of things you can do on your own. Experts have done a lot of research about which lifestyle changes and preventive measures are most likely to keep you healthy. Ask your health care provider for more information. WEIGHT AND DIET  Eat a healthy diet  Be sure to include plenty of vegetables, fruits, low-fat dairy products, and lean protein.  Do not eat a lot of foods high in solid fats, added sugars, or salt.  Get regular exercise. This is one of the most important things you can do for your health.  Most adults should exercise for at least 150 minutes each week. The exercise should increase your heart rate and make you sweat (moderate-intensity exercise).  Most adults should also do strengthening exercises at least twice a week. This is in addition to the moderate-intensity exercise.  Maintain a healthy weight  Body mass index (BMI) is a measurement that can be used to identify possible weight problems. It estimates body fat based on height and weight. Your health care provider can help determine your BMI and help you achieve or maintain a healthy weight.  For females 3 years of age and older:   A BMI below 18.5 is considered underweight.  A BMI of 18.5 to 24.9  is normal.  A BMI of 25 to 29.9 is considered overweight.  A BMI of 30 and above is  considered obese.  Watch levels of cholesterol and blood lipids  You should start having your blood tested for lipids and cholesterol at 58 years of age, then have this test every 5 years.  You may need to have your cholesterol levels checked more often if:  Your lipid or cholesterol levels are high.  You are older than 58 years of age.  You are at high risk for heart disease.  CANCER SCREENING   Lung Cancer  Lung cancer screening is recommended for adults 25-32 years old who are at high risk for lung cancer because of a history of smoking.  A yearly low-dose CT scan of the lungs is recommended for people who:  Currently smoke.  Have quit within the past 15 years.  Have at least a 30-pack-year history of smoking. A pack year is smoking an average of one pack of cigarettes a day for 1 year.  Yearly screening should continue until it has been 15 years since you quit.  Yearly screening should stop if you develop a health problem that would prevent you from having lung cancer treatment.  Breast Cancer  Practice breast self-awareness. This means understanding how your breasts normally appear and feel.  It also means doing regular breast self-exams. Let your health care provider know about any changes, no matter how small.  If you are in your 20s or 30s, you should have a clinical breast exam (CBE) by a health care provider every 1-3 years as part of a regular health exam.  If you are 101 or older, have a CBE every year. Also consider having a breast X-ray (mammogram) every year.  If you have a family history of breast cancer, talk to your health care provider about genetic screening.  If you are at high risk for breast cancer, talk to your health care provider about having an MRI and a mammogram every year.  Breast cancer gene (BRCA) assessment is recommended for women who have family members with BRCA-related cancers. BRCA-related cancers  include:  Breast.  Ovarian.  Tubal.  Peritoneal cancers.  Results of the assessment will determine the need for genetic counseling and BRCA1 and BRCA2 testing. Cervical Cancer Routine pelvic examinations to screen for cervical cancer are no longer recommended for nonpregnant women who are considered low risk for cancer of the pelvic organs (ovaries, uterus, and vagina) and who do not have symptoms. A pelvic examination may be necessary if you have symptoms including those associated with pelvic infections. Ask your health care provider if a screening pelvic exam is right for you.   The Pap test is the screening test for cervical cancer for women who are considered at risk.  If you had a hysterectomy for a problem that was not cancer or a condition that could lead to cancer, then you no longer need Pap tests.  If you are older than 65 years, and you have had normal Pap tests for the past 10 years, you no longer need to have Pap tests.  If you have had past treatment for cervical cancer or a condition that could lead to cancer, you need Pap tests and screening for cancer for at least 20 years after your treatment.  If you no longer get a Pap test, assess your risk factors if they change (such as having a new sexual partner). This can affect whether you should start  being screened again.  Some women have medical problems that increase their chance of getting cervical cancer. If this is the case for you, your health care provider may recommend more frequent screening and Pap tests.  The human papillomavirus (HPV) test is another test that may be used for cervical cancer screening. The HPV test looks for the virus that can cause cell changes in the cervix. The cells collected during the Pap test can be tested for HPV.  The HPV test can be used to screen women 32 years of age and older. Getting tested for HPV can extend the interval between normal Pap tests from three to five years.  An HPV  test also should be used to screen women of any age who have unclear Pap test results.  After 58 years of age, women should have HPV testing as often as Pap tests.  Colorectal Cancer  This type of cancer can be detected and often prevented.  Routine colorectal cancer screening usually begins at 58 years of age and continues through 58 years of age.  Your health care provider may recommend screening at an earlier age if you have risk factors for colon cancer.  Your health care provider may also recommend using home test kits to check for hidden blood in the stool.  A small camera at the end of a tube can be used to examine your colon directly (sigmoidoscopy or colonoscopy). This is done to check for the earliest forms of colorectal cancer.  Routine screening usually begins at age 6.  Direct examination of the colon should be repeated every 5-10 years through 58 years of age. However, you may need to be screened more often if early forms of precancerous polyps or small growths are found. Skin Cancer  Check your skin from head to toe regularly.  Tell your health care provider about any new moles or changes in moles, especially if there is a change in a mole's shape or color.  Also tell your health care provider if you have a mole that is larger than the size of a pencil eraser.  Always use sunscreen. Apply sunscreen liberally and repeatedly throughout the day.  Protect yourself by wearing long sleeves, pants, a wide-brimmed hat, and sunglasses whenever you are outside. HEART DISEASE, DIABETES, AND HIGH BLOOD PRESSURE   Have your blood pressure checked at least every 1-2 years. High blood pressure causes heart disease and increases the risk of stroke.  If you are between 68 years and 81 years old, ask your health care provider if you should take aspirin to prevent strokes.  Have regular diabetes screenings. This involves taking a blood sample to check your fasting blood sugar  level.  If you are at a normal weight and have a low risk for diabetes, have this test once every three years after 58 years of age.  If you are overweight and have a high risk for diabetes, consider being tested at a younger age or more often. PREVENTING INFECTION  Hepatitis B  If you have a higher risk for hepatitis B, you should be screened for this virus. You are considered at high risk for hepatitis B if:  You were born in a country where hepatitis B is common. Ask your health care provider which countries are considered high risk.  Your parents were born in a high-risk country, and you have not been immunized against hepatitis B (hepatitis B vaccine).  You have HIV or AIDS.  You use needles to inject  street drugs.  You live with someone who has hepatitis B.  You have had sex with someone who has hepatitis B.  You get hemodialysis treatment.  You take certain medicines for conditions, including cancer, organ transplantation, and autoimmune conditions. Hepatitis C  Blood testing is recommended for:  Everyone born from 1945 through 1965.  Anyone with known risk factors for hepatitis C. Sexually transmitted infections (STIs)  You should be screened for sexually transmitted infections (STIs) including gonorrhea and chlamydia if:  You are sexually active and are younger than 58 years of age.  You are older than 58 years of age and your health care provider tells you that you are at risk for this type of infection.  Your sexual activity has changed since you were last screened and you are at an increased risk for chlamydia or gonorrhea. Ask your health care provider if you are at risk.  If you do not have HIV, but are at risk, it may be recommended that you take a prescription medicine daily to prevent HIV infection. This is called pre-exposure prophylaxis (PrEP). You are considered at risk if:  You are sexually active and do not regularly use condoms or know the HIV status  of your partner(s).  You take drugs by injection.  You are sexually active with a partner who has HIV. Talk with your health care provider about whether you are at high risk of being infected with HIV. If you choose to begin PrEP, you should first be tested for HIV. You should then be tested every 3 months for as long as you are taking PrEP.  PREGNANCY   If you are premenopausal and you may become pregnant, ask your health care provider about preconception counseling.  If you may become pregnant, take 400 to 800 micrograms (mcg) of folic acid every day.  If you want to prevent pregnancy, talk to your health care provider about birth control (contraception). OSTEOPOROSIS AND MENOPAUSE   Osteoporosis is a disease in which the bones lose minerals and strength with aging. This can result in serious bone fractures. Your risk for osteoporosis can be identified using a bone density scan.  If you are 65 years of age or older, or if you are at risk for osteoporosis and fractures, ask your health care provider if you should be screened.  Ask your health care provider whether you should take a calcium or vitamin D supplement to lower your risk for osteoporosis.  Menopause may have certain physical symptoms and risks.  Hormone replacement therapy may reduce some of these symptoms and risks. Talk to your health care provider about whether hormone replacement therapy is right for you.  HOME CARE INSTRUCTIONS   Schedule regular health, dental, and eye exams.  Stay current with your immunizations.   Do not use any tobacco products including cigarettes, chewing tobacco, or electronic cigarettes.  If you are pregnant, do not drink alcohol.  If you are breastfeeding, limit how much and how often you drink alcohol.  Limit alcohol intake to no more than 1 drink per day for nonpregnant women. One drink equals 12 ounces of beer, 5 ounces of wine, or 1 ounces of hard liquor.  Do not use street  drugs.  Do not share needles.  Ask your health care provider for help if you need support or information about quitting drugs.  Tell your health care provider if you often feel depressed.  Tell your health care provider if you have ever been abused or   do not feel safe at home. Document Released: 06/26/2011 Document Revised: 04/27/2014 Document Reviewed: 11/12/2013 [facility] Patient Information 2015 [place], [place]. This information is not intended to replace advice given to you by your health care provider. Make sure you discuss any questions you have with your health care provider. DASH Eating Plan DASH stands for "Dietary Approaches to Stop Hypertension." The DASH eating plan is a healthy eating plan that has been shown to reduce high blood pressure (hypertension). Additional health benefits may include reducing the risk of type 2 diabetes mellitus, heart disease, and stroke. The DASH eating plan may also help with weight loss. WHAT DO I NEED TO KNOW ABOUT THE DASH EATING PLAN? For the DASH eating plan, you will follow these general guidelines:  Choose foods with a percent daily value for sodium of less than 5% (as listed on the food label).  Use salt-free seasonings or herbs instead of table salt or sea salt.  Check with your health care provider or pharmacist before using salt substitutes.  Eat lower-sodium products, often labeled as "lower sodium" or "no salt added."  Eat fresh foods.  Eat more vegetables, fruits, and low-fat dairy products.  Choose whole grains. Look for the word "whole" as the first word in the ingredient list.  Choose fish and skinless chicken or [place] more often than red meat. Limit fish, poultry, and meat to 6 oz (170 g) each day.  Limit sweets, desserts, sugars, and sugary drinks.  Choose heart-healthy fats.  Limit cheese to 1 oz (28 g) per day.  Eat more home-cooked food and less restaurant, buffet, and fast food.  Limit fried foods.  Cook foods  using methods other than frying.  Limit canned vegetables. If you do use them, rinse them well to decrease the sodium.  When eating at a restaurant, ask that your food be prepared with less salt, or no salt if possible. WHAT FOODS CAN I EAT? Seek help from a dietitian for individual calorie needs. Grains Whole grain or whole wheat bread. Brown rice. Whole grain or whole wheat pasta. Quinoa, bulgur, and whole grain cereals. Low-sodium cereals. Corn or whole wheat flour tortillas. Whole grain cornbread. Whole grain crackers. Low-sodium crackers. Vegetables Fresh or frozen vegetables (raw, steamed, roasted, or grilled). Low-sodium or reduced-sodium tomato and vegetable juices. Low-sodium or reduced-sodium tomato sauce and paste. Low-sodium or reduced-sodium canned vegetables.  Fruits All fresh, canned (in natural juice), or frozen fruits. Meat and Other Protein Products Ground beef (85% or leaner), grass-fed beef, or beef trimmed of fat. Skinless chicken or [place]. Ground chicken or [place]. Pork trimmed of fat. All fish and seafood. Eggs. Dried beans, peas, or lentils. Unsalted nuts and seeds. Unsalted canned beans. Dairy Low-fat dairy products, such as skim or 1% milk, 2% or reduced-fat cheeses, low-fat ricotta or cottage cheese, or plain low-fat yogurt. Low-sodium or reduced-sodium cheeses. Fats and Oils Tub margarines without trans fats. Light or reduced-fat mayonnaise and salad dressings (reduced sodium). Avocado. Safflower, olive, or canola oils. Natural peanut or almond butter. Other Unsalted popcorn and pretzels. The items listed above may not be a complete list of recommended foods or beverages. Contact your dietitian for more options. WHAT FOODS ARE NOT RECOMMENDED? Grains White bread. White pasta. White rice. Refined cornbread. Bagels and croissants. Crackers that contain trans fat. Vegetables Creamed or fried vegetables. Vegetables in a cheese sauce. Regular canned vegetables.  Regular canned tomato sauce and paste. Regular tomato and vegetable juices. Fruits Dried fruits. Canned fruit in light or heavy  syrup. Fruit juice. Meat and Other Protein Products Fatty cuts of meat. Ribs, chicken wings, bacon, sausage, bologna, salami, chitterlings, fatback, hot dogs, bratwurst, and packaged luncheon meats. Salted nuts and seeds. Canned beans with salt. Dairy Whole or 2% milk, cream, half-and-half, and cream cheese. Whole-fat or sweetened yogurt. Full-fat cheeses or blue cheese. Nondairy creamers and whipped toppings. Processed cheese, cheese spreads, or cheese curds. Condiments Onion and garlic salt, seasoned salt, table salt, and sea salt. Canned and packaged gravies. Worcestershire sauce. Tartar sauce. Barbecue sauce. Teriyaki sauce. Soy sauce, including reduced sodium. Steak sauce. Fish sauce. Oyster sauce. Cocktail sauce. Horseradish. Ketchup and mustard. Meat flavorings and tenderizers. Bouillon cubes. Hot sauce. Tabasco sauce. Marinades. Taco seasonings. Relishes. Fats and Oils Butter, stick margarine, lard, shortening, ghee, and bacon fat. Coconut, palm kernel, or palm oils. Regular salad dressings. Other Pickles and olives. Salted popcorn and pretzels. The items listed above may not be a complete list of foods and beverages to avoid. Contact your dietitian for more information. WHERE CAN I FIND MORE INFORMATION? National Heart, Lung, and [employer] Institute: travelstabloid.com Document Released: 11/30/2011 Document Revised: 04/27/2014 Document Reviewed: 10/15/2013 [facility] Patient Information 2015 [place], [place]. This information is not intended to replace advice given to you by your health care provider. Make sure you discuss any questions you have with your health care provider. Hypertension Hypertension, commonly called high blood pressure, is when the force of blood pumping through your arteries is too strong. Your arteries are the blood  vessels that carry blood from your heart throughout your body. A blood pressure reading consists of a higher number over a lower number, such as 110/72. The higher number (systolic) is the pressure inside your arteries when your heart pumps. The lower number (diastolic) is the pressure inside your arteries when your heart relaxes. Ideally you want your blood pressure below 120/80. Hypertension forces your heart to work harder to pump blood. Your arteries may become narrow or stiff. Having hypertension puts you at risk for heart disease, stroke, and other problems.  RISK FACTORS Some risk factors for high blood pressure are controllable. Others are not.  Risk factors you cannot control include:   Race. You may be at higher risk if you are African American.  Age. Risk increases with age.  Gender. Men are at higher risk than women before age 53 years. After age 63, women are at higher risk than men. Risk factors you can control include:  Not getting enough exercise or physical activity.  Being overweight.  Getting too much fat, sugar, calories, or salt in your diet.  Drinking too much alcohol. SIGNS AND SYMPTOMS Hypertension does not usually cause signs or symptoms. Extremely high blood pressure (hypertensive crisis) may cause headache, anxiety, shortness of breath, and nosebleed. DIAGNOSIS  To check if you have hypertension, your health care provider will measure your blood pressure while you are seated, with your arm held at the level of your heart. It should be measured at least twice using the same arm. Certain conditions can cause a difference in blood pressure between your right and left arms. A blood pressure reading that is higher than normal on one occasion does not mean that you need treatment. If one blood pressure reading is high, ask your health care provider about having it checked again. TREATMENT  Treating high blood pressure includes making lifestyle changes and possibly taking  medicine. Living a healthy lifestyle can help lower high blood pressure. You may need to change some of your habits.  Lifestyle changes may include:  Following the DASH diet. This diet is high in fruits, vegetables, and whole grains. It is low in salt, red meat, and added sugars.  Getting at least 2 hours of brisk physical activity every week.  Losing weight if necessary.  Not smoking.  Limiting alcoholic beverages.  Learning ways to reduce stress. If lifestyle changes are not enough to get your blood pressure under control, your health care provider may prescribe medicine. You may need to take more than one. Work closely with your health care provider to understand the risks and benefits. HOME CARE INSTRUCTIONS  Have your blood pressure rechecked as directed by your health care provider.   Take medicines only as directed by your health care provider. Follow the directions carefully. Blood pressure medicines must be taken as prescribed. The medicine does not work as well when you skip doses. Skipping doses also puts you at risk for problems.   Do not smoke.   Monitor your blood pressure at home as directed by your health care provider. SEEK MEDICAL CARE IF:   You think you are having a reaction to medicines taken.  You have recurrent headaches or feel dizzy.  You have swelling in your ankles.  You have trouble with your vision. SEEK IMMEDIATE MEDICAL CARE IF:  You develop a severe headache or confusion.  You have unusual weakness, numbness, or feel faint.  You have severe chest or abdominal pain.  You vomit repeatedly.  You have trouble breathing. MAKE SURE YOU:   Understand these instructions.  Will watch your condition.  Will get help right away if you are not doing well or get worse. Document Released: 12/11/2005 Document Revised: 04/27/2014 Document Reviewed: 10/03/2013 [facility] Patient Information 2015 [place], [place]. This information is not intended to  replace advice given to you by your health care provider. Make sure you discuss any questions you have with your health care provider. Basic Carbohydrate Counting for Diabetes Mellitus Carbohydrate counting is a method for keeping track of the amount of carbohydrates you eat. Eating carbohydrates naturally increases the level of sugar (glucose) in your blood, so it is important for you to know the amount that is okay for you to have in every meal. Carbohydrate counting helps keep the level of glucose in your blood within normal limits. The amount of carbohydrates allowed is different for every person. A dietitian can help you calculate the amount that is right for you. Once you know the amount of carbohydrates you can have, you can count the carbohydrates in the foods you want to eat. Carbohydrates are found in the following foods:  Grains, such as breads and cereals.  Dried beans and soy products.  Starchy vegetables, such as potatoes, peas, and corn.  Fruit and fruit juices.  Milk and yogurt.  Sweets and snack foods, such as cake, cookies, candy, chips, soft drinks, and fruit drinks. CARBOHYDRATE COUNTING There are two ways to count the carbohydrates in your food. You can use either of the methods or a combination of both. Reading the "Nutrition Facts" on [employer] The "Nutrition Facts" is an area that is included on the labels of almost all packaged food and beverages in the [place]. It includes the serving size of that food or beverage and information about the nutrients in each serving of the food, including the grams (g) of carbohydrate per serving.  Decide the number of servings of this food or beverage that you will be able to eat or drink.  Multiply that number of servings by the number of grams of carbohydrate that is listed on the label for that serving. The total will be the amount of carbohydrates you will be having when you eat or drink this food or beverage. Learning  Standard Serving Sizes of Food When you eat food that is not packaged or does not include "Nutrition Facts" on the label, you need to measure the servings in order to count the amount of carbohydrates.A serving of most carbohydrate-rich foods contains about 15 g of carbohydrates. The following list includes serving sizes of carbohydrate-rich foods that provide 15 g ofcarbohydrate per serving:   1 slice of bread (1 oz) or 1 six-inch tortilla.    of a hamburger bun or English muffin.  4-6 crackers.   cup unsweetened dry cereal.    cup hot cereal.   cup rice or pasta.    cup mashed potatoes or  of a large baked potato.  1 cup fresh fruit or one small piece of fruit.    cup canned or frozen fruit or fruit juice.  1 cup milk.   cup plain fat-free yogurt or yogurt sweetened with artificial sweeteners.   cup cooked dried beans or starchy vegetable, such as peas, corn, or potatoes.  Decide the number of standard-size servings that you will eat. Multiply that number of servings by 15 (the grams of carbohydrates in that serving). For example, if you eat 2 cups of strawberries, you will have eaten 2 servings and 30 g of carbohydrates (2 servings x 15 g = 30 g). For foods such as soups and casseroles, in which more than one food is mixed in, you will need to count the carbohydrates in each food that is included. EXAMPLE OF CARBOHYDRATE COUNTING Sample Dinner  3 oz chicken breast.   cup of brown rice.   cup of corn.  1 cup milk.   1 cup strawberries with sugar-free whipped topping.  Carbohydrate Calculation Step 1: Identify the foods that contain carbohydrates:   Rice.   Corn.   Milk.   Strawberries. Step 2:Calculate the number of servings eaten of each:   2 servings of rice.   1 serving of corn.   1 serving of milk.   1 serving of strawberries. Step 3: Multiply each of those number of servings by 15 g:   2 servings of rice x 15 g = 30 g.    1 serving of corn x 15 g = 15 g.   1 serving of milk x 15 g = 15 g.   1 serving of strawberries x 15 g = 15 g. Step 4: Add together all of the amounts to find the total grams of carbohydrates eaten: 30 g + 15 g + 15 g + 15 g = 75 g. Document Released: 12/11/2005 Document Revised: 04/27/2014 Document Reviewed: 11/07/2013 [facility] Patient Information 2015 [place], [place]. This information is not intended to replace advice given to you by your health care provider. Make sure you discuss any questions you have with your health care provider.

## 2015-03-01 NOTE — Progress Notes (Signed)
Patient ID: Candace Diaz, female   DOB: [birth year], 59 y.o.   MRN: [id number]  ANNUAL PREVENTATIVE VISIT AND CPE  Subjective:  Candace Diaz is a 59 y.o. female who presents for annual physical examination.  Date of last wellness visit is unknown  She has had elevated blood pressure for many years. She does not check her blood pressure at home.   Today her BP is at goal.  BP: 117/71 mmHg She does not workout. She denies chest pain, shortness of breath, dizziness.  She is on cholesterol medication and denies myalgias. Her cholesterol is at goal. The cholesterol last visit was:   Lab Results  Component Value Date   CHOL 139 02/19/2015   HDL 42* 02/19/2015   LDLCALC 74 02/19/2015   TRIG 113 02/19/2015   CHOLHDL 3.3 02/19/2015   She has had diabetes for many years. She has  been working on diet diet diabetes, and denies hyperglycemia, increased appetite, nausea, paresthesia of the feet, polydipsia and visual disturbances. She check blood sugars at home twice daily. Last A1C in the office was:  Lab Results  Component Value Date   HGBA1C 8.1* 12/03/2014   Patient is on Vitamin D supplement.   Lab Results  Component Value Date   VD25OH 10* 02/19/2015       Names of Other Physician/Practitioners you currently use: 1. [facility] here for primary care    Medication Review: Current Outpatient Prescriptions on File Prior to Visit  Medication Sig Dispense Refill  . ACCU-CHEK AVIVA PLUS test strip USE TO CHECK BLOOD SUGAR TWICE DAILY 50 each 5  . acetaminophen (TYLENOL) 500 MG tablet Take 1 tablet (500 mg total) by mouth every 6 (six) hours as needed for pain. 30 tablet 0  . amLODipine (NORVASC) 5 MG tablet Take 1 tablet (5 mg total) by mouth daily. 90 tablet 3  . cloNIDine (CATAPRES) 0.3 MG tablet Take 1 tablet (0.3 mg total) by mouth 3 (three) times daily. 90 tablet 3  . diltiazem (CARDIZEM) 120 MG tablet TAKE 1 TABLET BY MOUTH THREE TIMES A DAY 90 tablet 1  . EASY TOUCH  INSULIN SYRINGE 31G X 5/16" 0.5 ML MISC USE TO INJECT INSULIN EVERY DAY 100 each 1  . glipiZIDE (GLUCOTROL) 10 MG tablet TAKE 1 TABLET BY MOUTH TWICE DAILY BEFORE A MEAL 60 tablet 3  . hydrALAZINE (APRESOLINE) 25 MG tablet Take 25 mg by mouth 2 (two) times daily.     . hydrochlorothiazide (HYDRODIURIL) 25 MG tablet TAKE 1 TABLET BY MOUTH EVERY MORNING 30 tablet 3  . hydrochlorothiazide (HYDRODIURIL) 25 MG tablet TAKE 1 TABLET BY MOUTH EVERY MORNING 30 tablet 1  . Lancets (ACCU-CHEK MULTICLIX) lancets USE TO CHECK BLOOD SUGAR TWICE DAILY 100 each 1  . LANTUS 100 UNIT/ML injection INJECT 15 UNITS SUBCUTANEOUSLY AT BEDTIME 1500 mL 1  . lisinopril (PRINIVIL,ZESTRIL) 10 MG tablet TAKE 1 TABLET BY MOUTH EVERY DAY 30 tablet 6  . potassium chloride SA (K-DUR,KLOR-CON) 20 MEQ tablet TAKE 1 TABLET BY MOUTH EVERY DAY WITH FOOD 30 tablet 6  . pravastatin (PRAVACHOL) 40 MG tablet Take 1 tablet (40 mg total) by mouth daily. 90 tablet 3  . PROVENTIL HFA 108 (90 BASE) MCG/ACT inhaler INHALE 2 PUFFS BY MOUTH FOUR TIMES A DAY AS NEEDED FOR SHORTNESS OF BREATH 1 Inhaler 1   No current facility-administered medications on file prior to visit.    Current Problems (verified) Patient Active Problem List   Diagnosis Date Noted  .  HTN (hypertension), malignant 12/03/2014  . DOE (dyspnea on exertion) 12/03/2014  . Acute serous otitis media 08/20/2014  . Essential hypertension, malignant 08/20/2014  . Other and unspecified hyperlipidemia 08/20/2014  . Diabetes mellitus type 2, uncontrolled 08/20/2014  . Immunization due 08/20/2014  . Need for Tdap vaccination 08/20/2014  . History of palpitations 08/20/2014  . History of CVA with residual deficit 08/20/2014  . Arthritis of right knee 08/20/2014    Screening Tests Health Maintenance  Topic Date Due  . HIV Screening  06/09/1972  . PAP SMEAR  06/10/1975  . MAMMOGRAM  06/10/2007  . HEMOGLOBIN A1C  06/04/2015  . INFLUENZA VACCINE  07/26/2015  . OPHTHALMOLOGY  EXAM  09/15/2015  . FOOT EXAM  12/04/2015  . URINE MICROALBUMIN  12/04/2015  . COLONOSCOPY  12/14/2017  . PNEUMOCOCCAL POLYSACCHARIDE VACCINE (2) 08/15/2019  . TETANUS/TDAP  08/14/2024    Immunization History  Administered Date(s) Administered  . Influenza,inj,Quad PF,36+ Mos 12/03/2014  . Pneumococcal Polysaccharide-23 08/14/2014  . Tdap 08/14/2014    Preventative care: Last colonoscopy: 10 years ago Last mammogram: 5 years ago Last pap smear/pelvic exam: 4-5 years ago DEXA:Never  Prior vaccinations: TD or Tdap: 07/2014 Influenza: 11/2014  Pneumococcal: 07/2014     Medication List       This list is accurate as of: 03/01/15  3:35 PM.  Always use your most recent med list.               ACCU-CHEK AVIVA PLUS test strip  Generic drug:  glucose blood  USE TO CHECK BLOOD SUGAR TWICE DAILY     accu-chek multiclix lancets  USE TO CHECK BLOOD SUGAR TWICE DAILY     acetaminophen 500 MG tablet  Commonly known as:  TYLENOL  Take 1 tablet (500 mg total) by mouth every 6 (six) hours as needed for pain.     amLODipine 5 MG tablet  Commonly known as:  NORVASC  Take 1 tablet (5 mg total) by mouth daily.     cloNIDine 0.3 MG tablet  Commonly known as:  CATAPRES  Take 1 tablet (0.3 mg total) by mouth 3 (three) times daily.     diltiazem 120 MG tablet  Commonly known as:  CARDIZEM  TAKE 1 TABLET BY MOUTH THREE TIMES A DAY     EASY TOUCH INSULIN SYRINGE 31G X 5/16" 0.5 ML Misc  Generic drug:  Insulin Syringe-Needle U-100  USE TO INJECT INSULIN EVERY DAY     glipiZIDE 10 MG tablet  Commonly known as:  GLUCOTROL  TAKE 1 TABLET BY MOUTH TWICE DAILY BEFORE A MEAL     hydrALAZINE 25 MG tablet  Commonly known as:  APRESOLINE  Take 25 mg by mouth 2 (two) times daily.     hydrochlorothiazide 25 MG tablet  Commonly known as:  HYDRODIURIL  TAKE 1 TABLET BY MOUTH EVERY MORNING     hydrochlorothiazide 25 MG tablet  Commonly known as:  HYDRODIURIL  TAKE 1 TABLET BY MOUTH  EVERY MORNING     LANTUS 100 UNIT/ML injection  Generic drug:  insulin glargine  INJECT 15 UNITS SUBCUTANEOUSLY AT BEDTIME     lisinopril 10 MG tablet  Commonly known as:  PRINIVIL,ZESTRIL  TAKE 1 TABLET BY MOUTH EVERY DAY     potassium chloride SA 20 MEQ tablet  Commonly known as:  K-DUR,KLOR-CON  TAKE 1 TABLET BY MOUTH EVERY DAY WITH FOOD     pravastatin 40 MG tablet  Commonly known as:  PRAVACHOL  Take 1  tablet (40 mg total) by mouth daily.     PROVENTIL HFA 108 (90 BASE) MCG/ACT inhaler  Generic drug:  albuterol  INHALE 2 PUFFS BY MOUTH FOUR TIMES A DAY AS NEEDED FOR SHORTNESS OF BREATH        No past surgical history on file. Family History  Problem Relation Age of Onset  . Diabetes Brother   . Hypertension Brother   . Diabetes Other   . Hypertension Other     History reviewed: allergies, current medications, past family history, past medical history, past social history, past surgical history and problem list   Risk Factors: Osteoporosis/FallRisk: Patient denies any fractures or falls in the past year.   Tobacco History  Substance Use Topics  . Smoking status: Never Smoker   . Smokeless tobacco: Never Used  . Alcohol Use: No   She does not smoke.   Alcohol Current alcohol use: none  Caffeine Current caffeine use: denies use  Exercise Current exercise: Ms. Blakley reports that she does not have a car, so she has to walk everywhere.   Nutrition/Diet Current diet: Ms. Eggleton reports a carbohydrate modified diet.  Cardiac risk factors: diabetes mellitus, dyslipidemia, hypertension and obesity (BMI >= 30 kg/m2).  Depression Screen (Note: if answer to either of the following is "Yes", a more complete depression screening is indicated)   Over the past two weeks, have you felt down, depressed or hopeless? No Over the past two weeks, have you felt little interest or pleasure in doing things? No  Have you lost interest or pleasure in daily life? No  Do  you often feel hopeless? No   Do you cry easily over simple problems? No  Vision Difficulties: Ms. Roland Rack had surgery on right cataract.  She currently wears glasses for reading  Hearing Difficulties: Yes Do you often ask people to speak up or repeat themselves? Yes  Do you experience ringing or noises in your ears? No             Do you have difficulty understanding soft or whispered voices? Yes  Cognition  Do you feel that you have a problem with memory?  Do you often misplace items?  Do you feel safe at home?  Advanced directives Does patient have a Health Care Power of Attorney? No Does patient have a Living Will? No  Objective:     Blood pressure 117/71, pulse 76, temperature 97.7 F (36.5 C), temperature source Oral, resp. rate 16, height  (1.753 m), weight 223 lb (101.152 kg). Body mass index is 32.92 kg/(m^2).  General appearance: alert, no distress, WD/WN, female HEENT: normocephalic, sclerae anicteric, TMs dull gray, right TM, mild erythema, with scarring to TM, unable to visualize landmarks, nares patent, no discharge or erythema, pharynx normal Oral cavity: MMM, no lesions, partially edentulous  Neck: supple, no lymphadenopathy, no thyromegaly, no masses Heart: RRR, normal S1, S2, no murmurs Lungs: CTA bilaterally, no wheezes, rhonchi, or rales Abdomen: +bs, soft, non tender, non distended, no masses, no hepatomegaly, no splenomegaly Musculoskeletal: nontender, no swelling, no obvious deformity Extremities: no edema, no cyanosis, no clubbing Pulses: 2+ symmetric, upper and lower extremities, normal cap refill Neurological: alert, oriented x 3, CN2-12 intact, strength normal upper extremities and lower extremities, sensation normal throughout, DTRs 2+ throughout, no cerebellar signs, gait normal. Speech slightly slurred and slowed.  Vaginal:  Scant, white discharge, decreased vaginal rugae Psychiatric: normal affect, behavior normal, pleasant   Assessment:   Patient denies any difficulties at home. No  trouble with ADLs, depression or falls. No recent changes to vision,  Hearing remains decreased. She states that hearing has been decreased since childhood. . Is UTD with immunizations. Is Discussed Advanced Directives, provided written information. Encouraged heart healthy diet, exercise as tolerated and adequate sleep.   Plan:   During the course of the visit the patient was educated and counseled about appropriate screening and preventive services including:    Pneumococcal vaccine   Influenza vaccine  Td vaccine  Screening electrocardiogram  Bone densitometry screening  Colorectal cancer screening  Diabetes screening  Nutrition counseling   Advanced directive  Screening recommendations, referrals: Nutrition assessed and recommended  Colonoscopy requested Recommended yearly ophthalmology/optometry visit for glaucoma screening and checkup. Opthalmology visit was in February 2016.  Recommended yearly dental visit for hygiene and checkup Advanced directives - requested  Conditions/risks identified: BMI: Discussed weight loss, diet, and increase physical activity.  Increase physical activity: AHA recommends 150 minutes of physical activity a week.  Medications reviewed Diabetes is not at goal, patient follow ing a carbohydrate modified diet and remaining active.     Annual physical exam - Cytology - PAP [place] - EKG 12-Lead - IFOBT POC (occult bld, rslt in office) - CBC with Differential - COMPLETE METABOLIC PANEL WITH GFR - Hemoglobin A1c - TSH - Urinalysis, Complete - Mammogram Digital Diagnostic Bilateral; Future -Screening colonoscopy Dejanira Pamintuan M, FNP   03/01/2015

## 2015-03-02 ENCOUNTER — Other Ambulatory Visit: Payer: Self-pay | Admitting: Family Medicine

## 2015-03-02 ENCOUNTER — Encounter: Payer: Self-pay | Admitting: Family Medicine

## 2015-03-02 DIAGNOSIS — Z1231 Encounter for screening mammogram for malignant neoplasm of breast: Secondary | ICD-10-CM

## 2015-03-02 LAB — URINALYSIS, COMPLETE
BILIRUBIN URINE: NEGATIVE
Bacteria, UA: NONE SEEN
CASTS: NONE SEEN
CRYSTALS: NONE SEEN
Glucose, UA: 500 mg/dL — AB
Hgb urine dipstick: NEGATIVE
Nitrite: NEGATIVE
Protein, ur: NEGATIVE mg/dL
Specific Gravity, Urine: 1.022 (ref 1.005–1.030)
Urobilinogen, UA: 1 mg/dL (ref 0.0–1.0)
pH: 5 (ref 5.0–8.0)

## 2015-03-02 LAB — HEMOGLOBIN A1C
HEMOGLOBIN A1C: 10.3 % — AB (ref <5.7)
MEAN PLASMA GLUCOSE: 249 mg/dL — AB (ref <117)

## 2015-03-03 LAB — CYTOLOGY - PAP

## 2015-03-08 ENCOUNTER — Ambulatory Visit: Payer: Medicaid Other | Admitting: Family Medicine

## 2015-03-08 ENCOUNTER — Other Ambulatory Visit: Payer: Self-pay | Admitting: Family Medicine

## 2015-03-17 ENCOUNTER — Ambulatory Visit
Admission: RE | Admit: 2015-03-17 | Discharge: 2015-03-17 | Disposition: A | Discharge status: Home / Self Care | Payer: Medicaid Other | Source: Ambulatory Visit | Attending: Family Medicine | Admitting: Family Medicine

## 2015-03-17 ENCOUNTER — Ambulatory Visit: Payer: Medicaid Other

## 2015-03-17 DIAGNOSIS — Z1231 Encounter for screening mammogram for malignant neoplasm of breast: Secondary | ICD-10-CM

## 2015-03-18 ENCOUNTER — Other Ambulatory Visit: Payer: Self-pay | Admitting: Family Medicine

## 2015-03-18 DIAGNOSIS — R928 Other abnormal and inconclusive findings on diagnostic imaging of breast: Secondary | ICD-10-CM

## 2015-03-23 ENCOUNTER — Other Ambulatory Visit: Payer: Self-pay | Admitting: Internal Medicine

## 2015-03-23 ENCOUNTER — Other Ambulatory Visit: Payer: Self-pay | Admitting: Family Medicine

## 2015-03-25 ENCOUNTER — Other Ambulatory Visit: Payer: Medicaid Other

## 2015-03-31 ENCOUNTER — Ambulatory Visit
Admission: RE | Admit: 2015-03-31 | Discharge: 2015-03-31 | Disposition: A | Discharge status: Home / Self Care | Payer: Medicaid Other | Source: Ambulatory Visit | Attending: Family Medicine | Admitting: Family Medicine

## 2015-03-31 ENCOUNTER — Other Ambulatory Visit: Payer: Self-pay | Admitting: Family Medicine

## 2015-03-31 DIAGNOSIS — R928 Other abnormal and inconclusive findings on diagnostic imaging of breast: Secondary | ICD-10-CM

## 2015-03-31 DIAGNOSIS — N6320 Unspecified lump in the left breast, unspecified quadrant: Secondary | ICD-10-CM

## 2015-04-01 ENCOUNTER — Ambulatory Visit
Admission: RE | Admit: 2015-04-01 | Discharge: 2015-04-01 | Disposition: A | Discharge status: Home / Self Care | Payer: Medicaid Other | Source: Ambulatory Visit | Attending: Family Medicine | Admitting: Family Medicine

## 2015-04-01 ENCOUNTER — Other Ambulatory Visit (HOSPITAL_COMMUNITY): Payer: Self-pay | Admitting: Diagnostic Radiology

## 2015-04-01 ENCOUNTER — Other Ambulatory Visit: Payer: Self-pay | Admitting: Family Medicine

## 2015-04-01 DIAGNOSIS — N6320 Unspecified lump in the left breast, unspecified quadrant: Secondary | ICD-10-CM

## 2015-04-02 ENCOUNTER — Ambulatory Visit (INDEPENDENT_AMBULATORY_CARE_PROVIDER_SITE_OTHER): Payer: Medicaid Other | Admitting: Internal Medicine

## 2015-04-02 VITALS — BP 135/82 | HR 63 | Temp 97.9°F | Resp 16 | Ht 69.0 in | Wt 225.0 lb

## 2015-04-02 DIAGNOSIS — E1165 Type 2 diabetes mellitus with hyperglycemia: Secondary | ICD-10-CM

## 2015-04-02 DIAGNOSIS — IMO0002 Reserved for concepts with insufficient information to code with codable children: Secondary | ICD-10-CM

## 2015-04-02 MED ORDER — INSULIN GLARGINE 100 UNIT/ML ~~LOC~~ SOLN: 20.0000 [IU] | Freq: Every day | SUBCUTANEOUS | Status: DC

## 2015-04-06 ENCOUNTER — Ambulatory Visit: Payer: Medicaid Other

## 2015-04-13 ENCOUNTER — Other Ambulatory Visit: Payer: Self-pay | Admitting: Internal Medicine

## 2015-04-20 ENCOUNTER — Other Ambulatory Visit: Payer: Self-pay | Admitting: Family Medicine

## 2015-04-20 ENCOUNTER — Other Ambulatory Visit: Payer: Self-pay | Admitting: Internal Medicine

## 2015-05-10 ENCOUNTER — Ambulatory Visit: Payer: Medicaid Other | Admitting: Family Medicine

## 2015-05-12 ENCOUNTER — Ambulatory Visit (INDEPENDENT_AMBULATORY_CARE_PROVIDER_SITE_OTHER): Payer: Medicaid Other | Admitting: Family Medicine

## 2015-05-12 VITALS — BP 134/86 | HR 76 | Temp 97.6°F | Resp 16 | Ht 69.0 in | Wt 222.0 lb

## 2015-05-12 DIAGNOSIS — IMO0002 Reserved for concepts with insufficient information to code with codable children: Secondary | ICD-10-CM

## 2015-05-12 DIAGNOSIS — M1711 Unilateral primary osteoarthritis, right knee: Secondary | ICD-10-CM

## 2015-05-12 DIAGNOSIS — I10 Essential (primary) hypertension: Secondary | ICD-10-CM

## 2015-05-12 DIAGNOSIS — M129 Arthropathy, unspecified: Secondary | ICD-10-CM | POA: Diagnosis not present

## 2015-05-12 DIAGNOSIS — E1165 Type 2 diabetes mellitus with hyperglycemia: Secondary | ICD-10-CM | POA: Diagnosis not present

## 2015-05-12 DIAGNOSIS — K352 Acute appendicitis with generalized peritonitis, without abscess: Secondary | ICD-10-CM

## 2015-05-12 DIAGNOSIS — R600 Localized edema: Secondary | ICD-10-CM | POA: Diagnosis not present

## 2015-05-12 LAB — COMPLETE METABOLIC PANEL WITH GFR
ALBUMIN: 4.1 g/dL (ref 3.5–5.2)
ALK PHOS: 68 U/L (ref 39–117)
ALT: 37 U/L — ABNORMAL HIGH (ref 0–35)
AST: 26 U/L (ref 0–37)
BUN: 15 mg/dL (ref 6–23)
CO2: 28 meq/L (ref 19–32)
Calcium: 9.3 mg/dL (ref 8.4–10.5)
Chloride: 101 mEq/L (ref 96–112)
Creat: 0.8 mg/dL (ref 0.50–1.10)
GFR, EST NON AFRICAN AMERICAN: 82 mL/min
Glucose, Bld: 184 mg/dL — ABNORMAL HIGH (ref 70–99)
POTASSIUM: 3.5 meq/L (ref 3.5–5.3)
SODIUM: 137 meq/L (ref 135–145)
TOTAL PROTEIN: 7.6 g/dL (ref 6.0–8.3)
Total Bilirubin: 0.5 mg/dL (ref 0.2–1.2)

## 2015-05-12 LAB — POCT URINALYSIS DIP (DEVICE)
Bilirubin Urine: NEGATIVE
GLUCOSE, UA: NEGATIVE mg/dL
Hgb urine dipstick: NEGATIVE
Ketones, ur: NEGATIVE mg/dL
LEUKOCYTES UA: NEGATIVE
NITRITE: NEGATIVE
PH: 5.5 (ref 5.0–8.0)
Protein, ur: NEGATIVE mg/dL
Specific Gravity, Urine: 1.015 (ref 1.005–1.030)
Urobilinogen, UA: 0.2 mg/dL (ref 0.0–1.0)

## 2015-05-12 LAB — GLUCOSE, CAPILLARY: GLUCOSE-CAPILLARY: 180 mg/dL — AB (ref 65–99)

## 2015-05-12 NOTE — Progress Notes (Signed)
Subjective:    Patient ID: Candace Diaz, female    DOB: [birth year], 59 y.o.   MRN: [id number]  HPI Candace Diaz, a 59 year old female, with a history of hypertension, diabetes type 2, and swelling of the right knee. [name] states that she has been taking medications consistently.   [name] has a history of uncontrolled hypertension. She states that she has been taking medications consistently. [name] states that she has not discontinued Clonidine gradually as instructed previously. Patient here for follow-up of elevated blood pressure. She is not exercising and is not adherent to low salt diet. Patient denies dizziness,  dyspnea, fatigue, near-syncope, palpitations and tachypnea.  Cardiovascular risk factors includes: diabetes mellitus, dyslipidemia, obesity (BMI >= 30 kg/m2) and sedentary lifestyle.  [name] is also following up on type  2 diabetes. She states that she has been following a low carbohydrate diet and checking blood sugars regularly. She states that fasting blood sugars have ranged from 110-140.    Patient denies hyperglycemia, hypoglycemia , nausea, polydipsia, polyuria, visual disturbances, vomitting and weight loss.  Evaluation to date has been included: fasting blood sugar, hemoglobin A1C and microalbuminuria.    [name] is also complaining of chronic right knee pain.  She states that pain has been worsening over the past several months. She states that current pain intensity is 5/10 described as constant and aching. Current symptoms include stiffness and swelling. Pain is aggravated by any weight bearing, going up and down stairs, kneeling, lateral movements, pivoting, rising after sitting, standing and walking.  Patient has had prior knee problems and has received injections from previous orthopedic physician in [place].She last had tylenol several days ago with unsatisfactory relief.    Past Medical History  Diagnosis Date  . Hypertension   . Diabetes  mellitus    History   Social History  . Marital Status: Single    Spouse Name: N/A  . Number of Children: N/A  . Years of Education: N/A   Occupational History  . Not on file.   Social History Main Topics  . Smoking status: Never Smoker   . Smokeless tobacco: Never Used  . Alcohol Use: No  . Drug Use: No  . Sexual Activity: Not on file   Other Topics Concern  . Not on file   Social History Narrative   Review of Systems  Constitutional: Negative.   HENT: Negative.   Eyes: Negative.   Respiratory: Negative.   Cardiovascular: Positive for leg swelling. Negative for palpitations.  Gastrointestinal: Negative.   Endocrine: Negative.   Genitourinary: Negative.   Musculoskeletal: Negative.  Negative for neck pain and neck stiffness.  Skin: Negative.   Allergic/Immunologic: Negative.   Neurological: Negative.   Hematological: Negative.   Psychiatric/Behavioral: Negative.        Objective:   Physical Exam  Constitutional: She is oriented to person, place, and time. She appears well-developed.  HENT:  Head: Normocephalic and atraumatic.  Right Ear: External ear normal.  Left Ear: External ear normal.  Nose: Nose normal.  Mouth/Throat: Oropharynx is clear and moist.  Eyes: Conjunctivae and EOM are normal. Pupils are equal, round, and reactive to light.  Neck: Normal range of motion. Neck supple.  Cardiovascular: Normal rate, regular rhythm, normal heart sounds and intact distal pulses.   Pulmonary/Chest: Effort normal and breath sounds normal.  Abdominal: Soft. Bowel sounds are normal.  Musculoskeletal: Normal range of motion.       Right knee: She exhibits swelling,  effusion and abnormal patellar mobility. She exhibits no erythema.       Legs: Neurological: She is alert and oriented to person, place, and time. She has normal reflexes.  Skin: Skin is warm and dry.  Psychiatric: She has a normal mood and affect. Her behavior is normal. Thought content normal.       BP 134/86 mmHg  Pulse 76  Temp(Src) 97.6 F (36.4 C) (Oral)  Resp 16  Ht 5\' 9"  (1.753 m)  Wt 222 lb (100.699 kg)  BMI 32.77 kg/m2 Assessment & Plan:   1. HTN (hypertension), malignant [name] continues to take Clonidine TID as well as Hydralazine every 6 hours. Plan is to wean hydralazine starting at 25 mg TID, Norvasc 5 mg was added previously. [name] was to wean medications in February, but did not comply with plan.  Notified [facility] to assist with daily medication management. Also, to assist with weaning Hydralazine and Clonidine. I am concerned about the rebound hypertension associated with both Hydralazine and Clonidine. These medications do not improve the mortality associated with Ms. Bayard MalesGrogan's co morbidities. Will assess for kidney compromise. Will follow up for hypertension in 2 weeks to discuss weaning.   - POCT urinalysis dipstick - COMPLETE METABOLIC PANEL WITH GFR - cloNIDine (CATAPRES) 0.3 MG tablet; Take 1 tablet (0.3 mg total) by mouth 2 (two) times daily.  Dispense: 90 tablet; Refill: 0 - hydrALAZINE (APRESOLINE) 25 MG tablet; Take 1 tablet (25 mg total) by mouth every 12 (twelve) hours.  Dispense: 120 tablet; Refill: 1  2. Diabetes mellitus type 2, uncontrolled [name] states that her blood sugars have been controlled at home. Previous A1C was 10.3, will assess today.  - Hemoglobin A1c; Future - Glucose (CBG)  3. Arthritis of right knee [name] states that pain in right knee worsens with standing, walking, and taking stairs. Boggy edema to prepatellar aspect of right knee. She states that she has been taking Tylenol with unsatisfactory relief. Patient was under the care of an orthopedic physician in [place] prior to [place] [facility] [place].   - Ambulatory referral to Orthopedics  4. Edema of right lower extremity - Ambulatory referral to Orthopedics   Hollis,Lachina M, FNP  RTC: 2 weeks for hypertension.

## 2015-05-12 NOTE — Patient Instructions (Signed)
Hypertension Hypertension, commonly called high blood pressure, is when the force of blood pumping through your arteries is too strong. Your arteries are the blood vessels that carry blood from your heart throughout your body. A blood pressure reading consists of a higher number over a lower number, such as 110/72. The higher number (systolic) is the pressure inside your arteries when your heart pumps. The lower number (diastolic) is the pressure inside your arteries when your heart relaxes. Ideally you want your blood pressure below 120/80. Hypertension forces your heart to work harder to pump blood. Your arteries may become narrow or stiff. Having hypertension puts you at risk for heart disease, stroke, and other problems.  RISK FACTORS Some risk factors for high blood pressure are controllable. Others are not.  Risk factors you cannot control include:   Race. You may be at higher risk if you are African American.  Age. Risk increases with age.  Gender. Men are at higher risk than women before age 45 years. After age 65, women are at higher risk than men. Risk factors you can control include:  Not getting enough exercise or physical activity.  Being overweight.  Getting too much fat, sugar, calories, or salt in your diet.  Drinking too much alcohol. SIGNS AND SYMPTOMS Hypertension does not usually cause signs or symptoms. Extremely high blood pressure (hypertensive crisis) may cause headache, anxiety, shortness of breath, and nosebleed. DIAGNOSIS  To check if you have hypertension, your health care provider will measure your blood pressure while you are seated, with your arm held at the level of your heart. It should be measured at least twice using the same arm. Certain conditions can cause a difference in blood pressure between your right and left arms. A blood pressure reading that is higher than normal on one occasion does not mean that you need treatment. If one blood pressure reading  is high, ask your health care provider about having it checked again. TREATMENT  Treating high blood pressure includes making lifestyle changes and possibly taking medicine. Living a healthy lifestyle can help lower high blood pressure. You may need to change some of your habits. Lifestyle changes may include:  Following the DASH diet. This diet is high in fruits, vegetables, and whole grains. It is low in salt, red meat, and added sugars.  Getting at least 2 hours of brisk physical activity every week.  Losing weight if necessary.  Not smoking.  Limiting alcoholic beverages.  Learning ways to reduce stress. If lifestyle changes are not enough to get your blood pressure under control, your health care provider may prescribe medicine. You may need to take more than one. Work closely with your health care provider to understand the risks and benefits. HOME CARE INSTRUCTIONS  Have your blood pressure rechecked as directed by your health care provider.   Take medicines only as directed by your health care provider. Follow the directions carefully. Blood pressure medicines must be taken as prescribed. The medicine does not work as well when you skip doses. Skipping doses also puts you at risk for problems.   Do not smoke.   Monitor your blood pressure at home as directed by your health care provider. SEEK MEDICAL CARE IF:   You think you are having a reaction to medicines taken.  You have recurrent headaches or feel dizzy.  You have swelling in your ankles.  You have trouble with your vision. SEEK IMMEDIATE MEDICAL CARE IF:  You develop a severe headache or confusion.    You have unusual weakness, numbness, or feel faint.  You have severe chest or abdominal pain.  You vomit repeatedly.  You have trouble breathing. MAKE SURE YOU:   Understand these instructions.  Will watch your condition.  Will get help right away if you are not doing well or get worse. Document  Released: 12/11/2005 Document Revised: 04/27/2014 Document Reviewed: 10/03/2013 ExitCare Patient Information 2015 [employer]. This information is not intended to replace advice given to you by your health care provider. Make sure you discuss any questions you have with your health care provider. DASH Eating Plan DASH stands for "Dietary Approaches to Stop Hypertension." The DASH eating plan is a healthy eating plan that has been shown to reduce high blood pressure (hypertension). Additional health benefits may include reducing the risk of type 2 diabetes mellitus, heart disease, and stroke. The DASH eating plan may also help with weight loss. WHAT DO I NEED TO KNOW ABOUT THE DASH EATING PLAN? For the DASH eating plan, you will follow these general guidelines:  Choose foods with a percent daily value for sodium of less than 5% (as listed on the food label).  Use salt-free seasonings or herbs instead of table salt or sea salt.  Check with your health care provider or pharmacist before using salt substitutes.  Eat lower-sodium products, often labeled as "lower sodium" or "no salt added."  Eat fresh foods.  Eat more vegetables, fruits, and low-fat dairy products.  Choose whole grains. Look for the word "whole" as the first word in the ingredient list.  Choose fish and skinless chicken or turkey more often than red meat. Limit fish, poultry, and meat to 6 oz (170 g) each day.  Limit sweets, desserts, sugars, and sugary drinks.  Choose heart-healthy fats.  Limit cheese to 1 oz (28 g) per day.  Eat more home-cooked food and less restaurant, buffet, and fast food.  Limit fried foods.  Cook foods using methods other than frying.  Limit canned vegetables. If you do use them, rinse them well to decrease the sodium.  When eating at a restaurant, ask that your food be prepared with less salt, or no salt if possible. WHAT FOODS CAN I EAT? Seek help from a dietitian for individual  calorie needs. Grains Whole grain or whole wheat bread. Brown rice. Whole grain or whole wheat pasta. Quinoa, bulgur, and whole grain cereals. Low-sodium cereals. Corn or whole wheat flour tortillas. Whole grain cornbread. Whole grain crackers. Low-sodium crackers. Vegetables Fresh or frozen vegetables (raw, steamed, roasted, or grilled). Low-sodium or reduced-sodium tomato and vegetable juices. Low-sodium or reduced-sodium tomato sauce and paste. Low-sodium or reduced-sodium canned vegetables.  Fruits All fresh, canned (in natural juice), or frozen fruits. Meat and Other Protein Products Ground beef (85% or leaner), grass-fed beef, or beef trimmed of fat. Skinless chicken or turkey. Ground chicken or turkey. Pork trimmed of fat. All fish and seafood. Eggs. Dried beans, peas, or lentils. Unsalted nuts and seeds. Unsalted canned beans. Dairy Low-fat dairy products, such as skim or 1% milk, 2% or reduced-fat cheeses, low-fat ricotta or cottage cheese, or plain low-fat yogurt. Low-sodium or reduced-sodium cheeses. Fats and Oils Tub margarines without trans fats. Light or reduced-fat mayonnaise and salad dressings (reduced sodium). Avocado. Safflower, olive, or canola oils. Natural peanut or almond butter. Other Unsalted popcorn and pretzels. The items listed above may not be a complete list of recommended foods or beverages. Contact your dietitian for more options. WHAT FOODS ARE NOT RECOMMENDED? Grains White bread.   White pasta. White rice. Refined cornbread. Bagels and croissants. Crackers that contain trans fat. Vegetables Creamed or fried vegetables. Vegetables in a cheese sauce. Regular canned vegetables. Regular canned tomato sauce and paste. Regular tomato and vegetable juices. Fruits Dried fruits. Canned fruit in light or heavy syrup. Fruit juice. Meat and Other Protein Products Fatty cuts of meat. Ribs, chicken wings, bacon, sausage, bologna, salami, chitterlings, fatback, hot dogs,  bratwurst, and packaged luncheon meats. Salted nuts and seeds. Canned beans with salt. Dairy Whole or 2% milk, cream, half-and-half, and cream cheese. Whole-fat or sweetened yogurt. Full-fat cheeses or blue cheese. Nondairy creamers and whipped toppings. Processed cheese, cheese spreads, or cheese curds. Condiments Onion and garlic salt, seasoned salt, table salt, and sea salt. Canned and packaged gravies. Worcestershire sauce. Tartar sauce. Barbecue sauce. Teriyaki sauce. Soy sauce, including reduced sodium. Steak sauce. Fish sauce. Oyster sauce. Cocktail sauce. Horseradish. Ketchup and mustard. Meat flavorings and tenderizers. Bouillon cubes. Hot sauce. Tabasco sauce. Marinades. Taco seasonings. Relishes. Fats and Oils Butter, stick margarine, lard, shortening, ghee, and bacon fat. Coconut, palm kernel, or palm oils. Regular salad dressings. Other Pickles and olives. Salted popcorn and pretzels. The items listed above may not be a complete list of foods and beverages to avoid. Contact your dietitian for more information. WHERE CAN I FIND MORE INFORMATION? National Heart, Lung, and [employer] Institute: www.nhlbi.nih.gov/health/health-topics/topics/dash/ Document Released: 11/30/2011 Document Revised: 04/27/2014 Document Reviewed: 10/15/2013 ExitCare Patient Information 2015 [employer]. This information is not intended to replace advice given to you by your health care provider. Make sure you discuss any questions you have with your health care provider.  

## 2015-05-14 ENCOUNTER — Encounter: Payer: Self-pay | Admitting: Family Medicine

## 2015-05-14 MED ORDER — HYDRALAZINE HCL 25 MG PO TABS: 25.0000 mg | ORAL_TABLET | Freq: Two times a day (BID) | ORAL | Status: DC

## 2015-05-14 MED ORDER — CLONIDINE HCL 0.3 MG PO TABS: 0.3000 mg | ORAL_TABLET | Freq: Two times a day (BID) | ORAL | Status: DC

## 2015-05-18 ENCOUNTER — Other Ambulatory Visit: Payer: Self-pay | Admitting: Family Medicine

## 2015-05-18 ENCOUNTER — Telehealth: Payer: Self-pay | Admitting: Family Medicine

## 2015-05-18 ENCOUNTER — Other Ambulatory Visit: Payer: Self-pay | Admitting: Internal Medicine

## 2015-05-18 NOTE — Telephone Encounter (Signed)
Notified patient to discuss the weaning process of Clonidine and Hydralazine. She will take Clonidine 0.3 mg BID and Hydralazine 25 mg BID. Patient repeated instructions back and expressed understanding. She states that she has discussed regimen with [facility] as well. I also notified [facility] to assist with medication management, no answer, left message. Patient will follow up in 2 weeks for blood pressure check.    Massie MaroonHollis,Lachina M, FNP

## 2015-06-01 ENCOUNTER — Other Ambulatory Visit: Payer: Self-pay | Admitting: Family Medicine

## 2015-06-01 ENCOUNTER — Other Ambulatory Visit: Payer: Medicaid Other

## 2015-06-01 DIAGNOSIS — E1165 Type 2 diabetes mellitus with hyperglycemia: Secondary | ICD-10-CM

## 2015-06-01 DIAGNOSIS — IMO0002 Reserved for concepts with insufficient information to code with codable children: Secondary | ICD-10-CM

## 2015-06-01 DIAGNOSIS — I10 Essential (primary) hypertension: Secondary | ICD-10-CM

## 2015-06-01 LAB — HEMOGLOBIN A1C
HEMOGLOBIN A1C: 10 % — AB (ref <5.7)
MEAN PLASMA GLUCOSE: 240 mg/dL — AB (ref <117)

## 2015-06-01 MED ORDER — CLONIDINE HCL 0.3 MG PO TABS: 0.3000 mg | ORAL_TABLET | Freq: Every day | ORAL | Status: DC

## 2015-06-01 MED ORDER — AMLODIPINE BESYLATE 10 MG PO TABS: 10.0000 mg | ORAL_TABLET | Freq: Every day | ORAL | Status: DC

## 2015-06-01 NOTE — Progress Notes (Signed)
Candace Diaz, a 59 year old female presents for a blood pressure check. Blood pressure increased to 158/92 after weaning Clonidine to 0.3 mg BID and hydralazine 25 mg TID. I will continue to wean Clonidine to 0.3 mg daily, while increasing Amlodipine to 10 mg. I will not wean Hydralazine today, will address at next appointment (July 02, 2015).  I spoke with [name], she expressed understanding of plan.    Meds ordered this encounter  Medications  . amLODipine (NORVASC) 10 MG tablet    Sig: Take 1 tablet (10 mg total) by mouth daily.    Dispense:  30 tablet    Refill:  0    Order Specific Question:  Supervising Provider    Answer:  MATTHEWS, MICHELLE A [[id number]]  . cloNIDine (CATAPRES) 0.3 MG tablet    Sig: Take 1 tablet (0.3 mg total) by mouth daily.    Dispense:  30 tablet    Refill:  3   Aren Cherne M, FNP

## 2015-06-17 ENCOUNTER — Other Ambulatory Visit: Payer: Self-pay

## 2015-06-17 MED ORDER — POTASSIUM CHLORIDE CRYS ER 20 MEQ PO TBCR: 20.0000 meq | EXTENDED_RELEASE_TABLET | Freq: Every day | ORAL | Status: DC

## 2015-06-18 ENCOUNTER — Other Ambulatory Visit: Payer: Self-pay | Admitting: Family Medicine

## 2015-07-02 ENCOUNTER — Other Ambulatory Visit: Payer: Medicaid Other

## 2015-07-09 ENCOUNTER — Ambulatory Visit (INDEPENDENT_AMBULATORY_CARE_PROVIDER_SITE_OTHER): Payer: Medicaid Other | Admitting: Family Medicine

## 2015-07-09 VITALS — BP 92/60 | HR 64 | Temp 97.8°F | Resp 16 | Ht 69.0 in | Wt 227.0 lb

## 2015-07-09 DIAGNOSIS — I10 Essential (primary) hypertension: Secondary | ICD-10-CM | POA: Diagnosis not present

## 2015-07-09 NOTE — Patient Instructions (Signed)
1. Stop hydralazine. Your blood pressure is a little low today. 2. Come in for a BP check in one month. 3. Come in if you experience, dizziness, lightheadedness, near fainting, off-balance 4. Drink plenty of water. 5. Will follow-up on diabetes in 2 months.

## 2015-07-09 NOTE — Progress Notes (Signed)
Patient ID: Candace Diaz, female   DOB: Jul 29, 1956, 59 y.o.   MRN: [id number]   Candace Diaz, is a 59 y.o. female  [id number]  [id number]  DOB - 23-Nov-1956  CC:  Chief Complaint  Patient presents with  . Hypertension       HPI: Candace Diaz is a 59 y.o. female here for follow-up hypertension. She reports feeling well. Attempts were made to change some of her BP medications on her last visit but it is unclear whether she has attempted these changes or not. According to self report she is on amlodipine 10, clonidine 0.3 daily, cardizem 120 tid, apresoline 25 bid and HCTZ 25 daily.Candace Diaz was attempting to wean her clonidine and apresoline. Despite her low BP today she reports feeling well. She is due for a recheck on her diabetes in two months. Today we will focus on the blood pressure.  Allergies  Allergen Reactions  . Aspirin Other (See Comments)    Reaction uknown  . Penicillins Other (See Comments)    Reaction unknown   Past Medical History  Diagnosis Date  . Hypertension   . Diabetes mellitus    Current Outpatient Prescriptions on File Prior to Visit  Medication Sig Dispense Refill  . ACCU-CHEK AVIVA PLUS test strip USE TO CHECK BLOOD SUGAR TWICE DAILY 50 each 5  . acetaminophen (TYLENOL) 500 MG tablet Take 1 tablet (500 mg total) by mouth every 6 (six) hours as needed for pain. 30 tablet 0  . amLODipine (NORVASC) 10 MG tablet TAKE ONE TABLET BY MOUTH DAILY. 30 tablet 2  . cloNIDine (CATAPRES) 0.3 MG tablet Take 1 tablet (0.3 mg total) by mouth daily. 30 tablet 3  . diltiazem (CARDIZEM) 120 MG tablet TAKE 1 TABLET BY MOUTH THREE TIMES A DAY 90 tablet 3  . EASY TOUCH INSULIN SYRINGE 31G X 5/16" 0.5 ML MISC USE TO INJECT INSULIN EVERY DAY 30 each 3  . glipiZIDE (GLUCOTROL) 10 MG tablet TAKE 1 TABLET BY MOUTH TWICE DAILY BEFORE A MEAL 60 tablet 2  . hydrALAZINE (APRESOLINE) 25 MG tablet Take 1 tablet (25 mg total) by mouth every 12 (twelve) hours. 120 tablet 1  .  hydrochlorothiazide (HYDRODIURIL) 25 MG tablet TAKE 1 TABLET BY MOUTH EVERY MORNING 30 tablet 2  . insulin glargine (LANTUS) 100 UNIT/ML injection Inject 0.2 mLs (20 Units total) into the skin at bedtime. 1500 mL 1  . Lancets (ACCU-CHEK MULTICLIX) lancets USE TO CHECK BLOOD SUGAR TWICE DAILY 102 each 3  . lisinopril (PRINIVIL,ZESTRIL) 10 MG tablet TAKE 1 TABLET BY MOUTH EVERY DAY 30 tablet 6  . Multiple Vitamins-Minerals (MULTIVITAMIN WITH MINERALS) tablet Take 1 tablet by mouth daily.    . potassium chloride SA (K-DUR,KLOR-CON) 20 MEQ tablet Take 1 tablet (20 mEq total) by mouth daily. with food 30 tablet 6  . pravastatin (PRAVACHOL) 40 MG tablet Take 1 tablet (40 mg total) by mouth daily. 90 tablet 3  . PROVENTIL HFA 108 (90 BASE) MCG/ACT inhaler INHALE 2 PUFFS BY MOUTH FOUR TIMES A DAY AS NEEDED FOR SHORTNESS OF BREATH 6.7 g 3  . PROVENTIL HFA 108 (90 BASE) MCG/ACT inhaler INHALE 2 PUFFS BY MOUTH FOUR TIMES A DAY AS NEEDED FOR SHORTNESS OF BREATH 6.7 g 0   No current facility-administered medications on file prior to visit.   Family History  Problem Relation Age of Onset  . Diabetes Brother   . Hypertension Brother   . Diabetes Other   . Hypertension Other    History  Social History  . Marital Status: Single    Spouse Name: N/A  . Number of Children: N/A  . Years of Education: N/A   Occupational History  . Not on file.   Social History Main Topics  . Smoking status: Never Smoker   . Smokeless tobacco: Never Used  . Alcohol Use: No  . Drug Use: No  . Sexual Activity: Not on file   Other Topics Concern  . Not on file   Social History Narrative    Review of Systems: Constitutional: Negative for fever, chills, appetite change, weight loss,  fatigue. HENT: Negative for ear pain, ear discharge.nose bleeds Eyes: Negative for pain, discharge, redness, itching and visual disturbance. Neck: Negative for pain, stiffness Respiratory: Negative for cough, shortness of breath,    Cardiovascular: Negative for chest pain, palpitations and leg swelling. Gastrointestinal: Negative for abdominal distention, abdominal pain, nausea, vomiting, diarrhea, constipations Genitourinary: Negative for dysuria, urgency, frequency, hematuria, flank pain,  Musculoskeletal: Negative for back pain, joint pain, joint  swelling, arthralgia and gait problem.Negative for weakness. Neurological: Negative for dizziness, tremors, seizures, syncope,   light-headedness, numbness and headaches.  Hematological: Negative for easy bruising or bleeding Psychiatric/Behavioral: Negative for depression, anxiety, decreased concentration, confusion    Objective:   Filed Vitals:   07/09/15 1052  BP: 92/60  Pulse:   Temp:   Resp:     Physical Exam: Constitutional: Patient appears well-developed and well-nourished. No distress. HENT: Normocephalic, atraumatic, External right and left ear normal. Oropharynx is clear and moist.  Eyes: Conjunctivae and EOM are normal. PERRLA, no scleral icterus. Neck: Normal ROM. Neck supple. No lymphadenopathy, No thyromegaly. CVS: RRR, S1/S2 +, no murmurs, no gallops, no rubs Pulmonary: Effort and breath sounds normal, no stridor, rhonchi, wheezes, rales.  Abdominal: Soft. Normoactive BS,, no distension, tenderness, rebound or guarding.  Musculoskeletal: Normal range of motion. No edema and no tenderness.  Neuro: Alert.Normal muscle tone coordination. Non-focal Skin: Skin is warm and dry. No rash noted. Not diaphoretic. No erythema. No pallor. Psychiatric: Normal mood and affect. Behavior, judgment, thought content normal.  Lab Results  Component Value Date   WBC 8.1 03/01/2015   HGB 12.0 03/01/2015   HCT 37.0 03/01/2015   MCV 88.7 03/01/2015   PLT 357 03/01/2015   Lab Results  Component Value Date   CREATININE 0.80 05/12/2015   BUN 15 05/12/2015   NA 137 05/12/2015   K 3.5 05/12/2015   CL 101 05/12/2015   CO2 28 05/12/2015    Lab Results   Component Value Date   HGBA1C 10.0* 06/01/2015   Lipid Panel     Component Value Date/Time   CHOL 139 02/19/2015 0921   TRIG 113 02/19/2015 0921   HDL 42* 02/19/2015 0921   CHOLHDL 3.3 02/19/2015 0921   VLDL 23 02/19/2015 0921   LDLCALC 74 02/19/2015 0921       Assessment and plan:   Hypertension, BP low today -Her BP today repeated on 3 different occassions, has been from 92-100/60/70. I am discontinuing her Apresoline today. -She is to take clonidine 0.3 only once daily and continue other medications as are. -She is to call if she experienced and lightheadedness, feeling faint or dizzy or anyother unususal symptoms. -Follow-up in one month for recheck, sooner for any unusual symptoms.    The patient was given clear instructions to go to ER or return to medical center if symptoms don't improve, worsen or new problems develop. The patient verbalized understanding. The patient was told to  call to get lab results if they haven't heard anything in the next week.       Henrietta Hoover, MSN, FNP-BC   07/09/2015, 11:06 AM

## 2015-07-16 ENCOUNTER — Other Ambulatory Visit: Payer: Self-pay | Admitting: Internal Medicine

## 2015-08-09 ENCOUNTER — Ambulatory Visit: Payer: Medicaid Other

## 2015-08-09 VITALS — BP 120/60

## 2015-08-12 ENCOUNTER — Other Ambulatory Visit: Payer: Self-pay | Admitting: Internal Medicine

## 2015-08-13 ENCOUNTER — Ambulatory Visit: Payer: Medicaid Other | Admitting: Family Medicine

## 2015-09-03 ENCOUNTER — Ambulatory Visit: Payer: Medicaid Other | Admitting: Family Medicine

## 2015-09-09 ENCOUNTER — Other Ambulatory Visit: Payer: Self-pay | Admitting: Family Medicine

## 2015-09-09 ENCOUNTER — Ambulatory Visit (INDEPENDENT_AMBULATORY_CARE_PROVIDER_SITE_OTHER): Payer: Medicaid Other | Admitting: Family Medicine

## 2015-09-09 VITALS — BP 126/79 | HR 86 | Temp 97.5°F | Resp 16 | Ht 69.0 in | Wt 229.0 lb

## 2015-09-09 DIAGNOSIS — M25561 Pain in right knee: Secondary | ICD-10-CM | POA: Diagnosis not present

## 2015-09-09 DIAGNOSIS — E1165 Type 2 diabetes mellitus with hyperglycemia: Secondary | ICD-10-CM

## 2015-09-09 DIAGNOSIS — E7800 Pure hypercholesterolemia, unspecified: Secondary | ICD-10-CM

## 2015-09-09 DIAGNOSIS — E118 Type 2 diabetes mellitus with unspecified complications: Secondary | ICD-10-CM | POA: Diagnosis not present

## 2015-09-09 DIAGNOSIS — Z23 Encounter for immunization: Secondary | ICD-10-CM

## 2015-09-09 DIAGNOSIS — I10 Essential (primary) hypertension: Secondary | ICD-10-CM

## 2015-09-09 DIAGNOSIS — IMO0002 Reserved for concepts with insufficient information to code with codable children: Secondary | ICD-10-CM

## 2015-09-09 LAB — COMPLETE METABOLIC PANEL WITH GFR
ALT: 69 U/L — AB (ref 6–29)
AST: 53 U/L — ABNORMAL HIGH (ref 10–35)
Albumin: 4.4 g/dL (ref 3.6–5.1)
Alkaline Phosphatase: 59 U/L (ref 33–130)
BUN: 16 mg/dL (ref 7–25)
CHLORIDE: 102 mmol/L (ref 98–110)
CO2: 29 mmol/L (ref 20–31)
CREATININE: 0.79 mg/dL (ref 0.50–1.05)
Calcium: 9.5 mg/dL (ref 8.6–10.4)
GFR, EST NON AFRICAN AMERICAN: 83 mL/min (ref >=60)
Glucose, Bld: 77 mg/dL (ref 65–99)
Potassium: 3.8 mmol/L (ref 3.5–5.3)
Sodium: 140 mmol/L (ref 135–146)
Total Bilirubin: 0.6 mg/dL (ref 0.2–1.2)
Total Protein: 7.8 g/dL (ref 6.1–8.1)

## 2015-09-09 LAB — CBC WITH DIFFERENTIAL/PLATELET
BASOS ABS: 0.1 10*3/uL (ref 0.0–0.1)
Basophils Relative: 1 % (ref 0–1)
Eosinophils Absolute: 0.1 10*3/uL (ref 0.0–0.7)
Eosinophils Relative: 2 % (ref 0–5)
HEMATOCRIT: 35.4 % — AB (ref 36.0–46.0)
Hemoglobin: 11.4 g/dL — ABNORMAL LOW (ref 12.0–15.0)
LYMPHS PCT: 36 % (ref 12–46)
Lymphs Abs: 2.3 10*3/uL (ref 0.7–4.0)
MCH: 28.6 pg (ref 26.0–34.0)
MCHC: 32.2 g/dL (ref 30.0–36.0)
MCV: 88.9 fL (ref 78.0–100.0)
MPV: 9.8 fL (ref 8.6–12.4)
Monocytes Absolute: 0.3 10*3/uL (ref 0.1–1.0)
Monocytes Relative: 5 % (ref 3–12)
NEUTROS PCT: 56 % (ref 43–77)
Neutro Abs: 3.5 10*3/uL (ref 1.7–7.7)
Platelets: 364 10*3/uL (ref 150–400)
RBC: 3.98 MIL/uL (ref 3.87–5.11)
RDW: 13.6 % (ref 11.5–15.5)
WBC: 6.3 10*3/uL (ref 4.0–10.5)

## 2015-09-09 LAB — LIPID PANEL
CHOL/HDL RATIO: 2.7 ratio (ref <=5.0)
CHOLESTEROL: 139 mg/dL (ref 125–200)
HDL: 52 mg/dL (ref >=46)
LDL Cholesterol: 68 mg/dL (ref <130)
TRIGLYCERIDES: 94 mg/dL (ref <150)
VLDL: 19 mg/dL (ref <30)

## 2015-09-09 MED ORDER — HYDROCHLOROTHIAZIDE 25 MG PO TABS: 25.0000 mg | ORAL_TABLET | Freq: Every morning | ORAL | Status: DC

## 2015-09-09 MED ORDER — PRAVASTATIN SODIUM 40 MG PO TABS: 40.0000 mg | ORAL_TABLET | Freq: Every day | ORAL | Status: DC

## 2015-09-09 MED ORDER — AMLODIPINE BESYLATE 10 MG PO TABS: 10.0000 mg | ORAL_TABLET | Freq: Every day | ORAL | Status: DC

## 2015-09-09 MED ORDER — LISINOPRIL 10 MG PO TABS: 10.0000 mg | ORAL_TABLET | Freq: Every day | ORAL | Status: DC

## 2015-09-09 NOTE — Progress Notes (Signed)
Patient ID: Candace Diaz, female   DOB: 23-Feb-1956, 59 y.o.   MRN: [id number]   Alieah Brinton, is a 59 y.o. female  HYQ:[id number]  [id number]:[id number]  DOB - [birth year]  CC:  Chief Complaint  Patient presents with  . Hypertension  . Arm Pain       HPI: Candace Diaz is a 59 y.o. female here for follow-up chronic conditions, specifically Type II Diabetes and Hypertension. She is also complaining of some recent right arm soreness and some left knee pain. Her current medications are lantus 20 units at HS and glipizid 10 mg bid. For hTN is is on Clonidine 0.3 mg daily,cardizem 120 tid. Lisinopril 10 daily, hctz 25 daily and amlodipine 10 daily. She is also on Pravastatin for hypercholesterolemia and proventil for asthma. She reports doing well.  She did not bring her meter and is unable to give me a reliable picture of her blood sugars at home. She does not check her BP at home. Allergies  Allergen Reactions  . Aspirin Other (See Comments)    Reaction uknown  . Penicillins Other (See Comments)    Reaction unknown   Past Medical History  Diagnosis Date  . Hypertension   . Diabetes mellitus    Current Outpatient Prescriptions on File Prior to Visit  Medication Sig Dispense Refill  . ACCU-CHEK AVIVA PLUS test strip USE TO CHECK BLOOD SUGAR TWICE DAILY 50 each 5  . acetaminophen (TYLENOL) 500 MG tablet Take 1 tablet (500 mg total) by mouth every 6 (six) hours as needed for pain. 30 tablet 0  . cloNIDine (CATAPRES) 0.3 MG tablet Take 1 tablet (0.3 mg total) by mouth daily. 30 tablet 3  . diltiazem (CARDIZEM) 120 MG tablet TAKE 1 TABLET BY MOUTH THREE TIMES A DAY 30 tablet 2  . EASY TOUCH INSULIN SYRINGE 31G X 5/16" 0.5 ML MISC USE TO INJECT INSULIN EVERY DAY 30 each 3  . glipiZIDE (GLUCOTROL) 10 MG tablet TAKE 1 TABLET BY MOUTH TWICE DAILY BEFORE A MEAL 30 tablet 1  . insulin glargine (LANTUS) 100 UNIT/ML injection Inject 0.2 mLs (20 Units total) into the skin at bedtime. 1500 mL 1  . Lancets  (ACCU-CHEK MULTICLIX) lancets USE TO CHECK BLOOD SUGAR TWICE DAILY 102 each 3  . Multiple Vitamins-Minerals (MULTIVITAMIN WITH MINERALS) tablet Take 1 tablet by mouth daily.    . potassium chloride SA (K-DUR,KLOR-CON) 20 MEQ tablet Take 1 tablet (20 mEq total) by mouth daily. with food 30 tablet 6  . PROVENTIL HFA 108 (90 BASE) MCG/ACT inhaler INHALE 2 PUFFS BY MOUTH FOUR TIMES A DAY AS NEEDED FOR SHORTNESS OF BREATH 6.7 g 3  . PROVENTIL HFA 108 (90 BASE) MCG/ACT inhaler INHALE 2 PUFFS BY MOUTH FOUR TIMES A DAY AS NEEDED FOR SHORTNESS OF BREATH 6.7 g 0   No current facility-administered medications on file prior to visit.   Family History  Problem Relation Age of Onset  . Diabetes Brother   . Hypertension Brother   . Diabetes Other   . Hypertension Other    Social History   Social History  . Marital Status: Single    Spouse Name: N/A  . Number of Children: N/A  . Years of Education: N/A   Occupational History  . Not on file.   Social History Main Topics  . Smoking status: Never Smoker   . Smokeless tobacco: Never Used  . Alcohol Use: No  . Drug Use: No  . Sexual Activity: Not on file   Other  Topics Concern  . Not on file   Social History Narrative    Review of Systems: Constitutional: Negative for fever, chills, appetite change, weight loss,  fatigue. HENT: Negative for ear pain, ear discharge.nose bleeds Eyes: Negative for pain, discharge, redness, itching and visual disturbance. Neck: Negative for pain, stiffness Respiratory: Negative for cough, shortness of breath,   Cardiovascular: Negative for chest pain, palpitations and leg swelling. Gastrointestinal: Negative for abdominal distention, abdominal pain, nausea, vomiting, diarrhea, constipations Genitourinary: Negative for dysuria, urgency, frequency, hematuria, flank pain,  Musculoskeletal: Negative for back pain. Positive for right arm soreness and left knee pain..Negative for weakness. Neurological: Negative  for dizziness, tremors, seizures, syncope,   light-headedness, numbness and headaches.  Hematological: Negative for easy bruising or bleeding Psychiatric/Behavioral: Negative for depression, anxiety, decreased concentration, confusion    Objective:   Filed Vitals:   09/09/15 1005  BP: 126/79  Pulse: 86  Temp: 97.5 F (36.4 C)  Resp: 16    Physical Exam: Constitutional: Patient appears well-developed and well-nourished. No distress. HENT: Normocephalic, atraumatic, External right and left ear normal. Oropharynx is clear and moist.  Eyes: Conjunctivae and EOM are normal. PERRLA, no scleral icterus. Neck: Normal ROM. Neck supple. No lymphadenopathy, No thyromegaly. CVS: RRR, S1/S2 +, no murmurs, no gallops, no rubs Pulmonary: Effort and breath sounds normal, no stridor, rhonchi, wheezes, rales.  Abdominal: Soft. Normoactive BS,, no distension, tenderness, rebound or guarding.  Musculoskeletal: Normal range of motion. No edema and no tenderness.  Neuro: Alert.Normal muscle tone coordination. Non-focal Skin: Skin is warm and dry. No rash noted. Not diaphoretic. No erythema. No pallor. Psychiatric: Normal mood and affect. Behavior, judgment, thought content normal.  Lab Results  Component Value Date   WBC 8.1 03/01/2015   HGB 12.0 03/01/2015   HCT 37.0 03/01/2015   MCV 88.7 03/01/2015   PLT 357 03/01/2015   Lab Results  Component Value Date   CREATININE 0.80 05/12/2015   BUN 15 05/12/2015   NA 137 05/12/2015   K 3.5 05/12/2015   CL 101 05/12/2015   CO2 28 05/12/2015    Lab Results  Component Value Date   HGBA1C 10.0* 06/01/2015   Lipid Panel     Component Value Date/Time   CHOL 139 02/19/2015 0921   TRIG 113 02/19/2015 0921   HDL 42* 02/19/2015 0921   CHOLHDL 3.3 02/19/2015 0921   VLDL 23 02/19/2015 0921   LDLCALC 74 02/19/2015 0921       Assessment and plan:   1. Type 2 diabetes mellitus with complication  - COMPLETE METABOLIC PANEL WITH GFR - CBC with  Differential - Hemoglobin A1c - Lipid panel  2. Hyperlipidemia  - pravastatin (PRAVACHOL) 40 MG tablet; Take 1 tablet (40 mg total) by mouth daily.  Dispense: 90 tablet; Refill: 3    4. Right knee pain  - Ambulatory referral to Orthopedic Surgery  5. HTN (hypertension), controlled - Continue current treatment. -Refill of amlodipine 10 #90, 3 refills. One po q day -Refill Hctz 25, #90 with 3 refills, one po q day Refill lisinopril 10 mg #90, with 3 refills, one po q day.  Health maintenance -influenza vaccine today      Follow-up 3 months.     The patient was given clear instructions to go to ER or return to medical center if symptoms don't improve, worsen or new problems develop. The patient verbalized understanding.      Henrietta Hoover, MSN, FNP-BC   09/09/2015, 12:11 PM

## 2015-09-10 ENCOUNTER — Other Ambulatory Visit: Payer: Self-pay | Admitting: Family Medicine

## 2015-09-10 ENCOUNTER — Other Ambulatory Visit: Payer: Self-pay | Admitting: Internal Medicine

## 2015-09-10 DIAGNOSIS — E559 Vitamin D deficiency, unspecified: Secondary | ICD-10-CM

## 2015-09-10 LAB — HEMOGLOBIN A1C
Hgb A1c MFr Bld: 7.4 % — ABNORMAL HIGH (ref <5.7)
MEAN PLASMA GLUCOSE: 166 mg/dL — AB (ref <117)

## 2015-09-11 LAB — VITAMIN D 25 HYDROXY (VIT D DEFICIENCY, FRACTURES): Vit D, 25-Hydroxy: 27 ng/mL — ABNORMAL LOW (ref 30–100)

## 2015-09-12 ENCOUNTER — Emergency Department (HOSPITAL_COMMUNITY)
Admission: EM | Admit: 2015-09-12 | Discharge: 2015-09-12 | Disposition: A | Discharge status: Home / Self Care | Payer: Medicaid Other | Attending: Emergency Medicine | Admitting: Emergency Medicine

## 2015-09-12 ENCOUNTER — Encounter (HOSPITAL_COMMUNITY): Payer: Self-pay | Admitting: Emergency Medicine

## 2015-09-12 DIAGNOSIS — I10 Essential (primary) hypertension: Secondary | ICD-10-CM | POA: Diagnosis not present

## 2015-09-12 DIAGNOSIS — Z79899 Other long term (current) drug therapy: Secondary | ICD-10-CM | POA: Diagnosis not present

## 2015-09-12 DIAGNOSIS — M79601 Pain in right arm: Secondary | ICD-10-CM | POA: Diagnosis not present

## 2015-09-12 DIAGNOSIS — Z794 Long term (current) use of insulin: Secondary | ICD-10-CM | POA: Insufficient documentation

## 2015-09-12 DIAGNOSIS — E119 Type 2 diabetes mellitus without complications: Secondary | ICD-10-CM | POA: Insufficient documentation

## 2015-09-12 MED ORDER — CYCLOBENZAPRINE HCL 10 MG PO TABS
10.0000 mg | ORAL_TABLET | Freq: Once | ORAL | Status: AC
  Administered 2015-09-12: 10 mg via ORAL
  Filled 2015-09-12: qty 1

## 2015-09-12 MED ORDER — IBUPROFEN 800 MG PO TABS
800.0000 mg | ORAL_TABLET | Freq: Once | ORAL | Status: AC
  Administered 2015-09-12: 800 mg via ORAL
  Filled 2015-09-12: qty 1

## 2015-09-12 MED ORDER — NAPROXEN 500 MG PO TABS: 500.0000 mg | ORAL_TABLET | Freq: Two times a day (BID) | ORAL | Status: DC

## 2015-09-12 NOTE — ED Provider Notes (Signed)
CSN: [id number]     Arrival date & time 09/12/15  0246 History  This chart was scribed for Geoffery Lyons, MD by Evon Slack, ED Scribe. This patient was seen in room WA09/WA09 and the patient's care was started at 3:20 AM.    Chief Complaint  Patient presents with  . Arm Pain    Patient is a 59 y.o. female presenting with arm pain. The history is provided by the patient. No language interpreter was used.  Arm Pain The current episode started more than 1 week ago.   HPI Comments: Candace Diaz is a 59 y.o. female who presents to the Emergency Department complaining of right arm pain onset 1 week prior. Pt doesn't report any associated symptoms. She states that she feels as if the pain is muscular. Pt states that she has tried tylenol and ibuprofen with no relief. She states that the pain is better when ever she holds the arm up. She states that the pani is worse with movement  Pt denies fall, injury or repetitive movements. Pt denies numbness, weakness, neck pain or other related symptoms.    Past Medical History  Diagnosis Date  . Hypertension   . Diabetes mellitus    History reviewed. No pertinent past surgical history. Family History  Problem Relation Age of Onset  . Diabetes Brother   . Hypertension Brother   . Diabetes Other   . Hypertension Other    Social History  Substance Use Topics  . Smoking status: Never Smoker   . Smokeless tobacco: Never Used  . Alcohol Use: No   OB History    No data available     Review of Systems  Musculoskeletal: Positive for myalgias. Negative for neck pain.  Neurological: Negative for weakness and numbness.  All other systems reviewed and are negative.     Allergies  Aspirin and Penicillins  Home Medications   Prior to Admission medications   Medication Sig Start Date End Date Taking? Authorizing Provider  ACCU-CHEK AVIVA PLUS test strip USE TO CHECK BLOOD SUGAR TWICE DAILY 12/22/14   Altha Harm, MD  acetaminophen  (TYLENOL) 500 MG tablet Take 1 tablet (500 mg total) by mouth every 6 (six) hours as needed for pain. 05/30/13   Gerhard Munch, MD  amLODipine (NORVASC) 10 MG tablet Take 1 tablet (10 mg total) by mouth daily. 09/09/15   Henrietta Hoover, NP  cloNIDine (CATAPRES) 0.3 MG tablet Take 1 tablet (0.3 mg total) by mouth daily. 06/01/15   Massie Maroon, FNP  diltiazem (CARDIZEM) 120 MG tablet TAKE 1 TABLET BY MOUTH THREE TIMES A DAY 07/19/15   Henrietta Hoover, NP  EASY [facility] INSULIN SYRINGE 31G X 5/16" 0.5 ML MISC USE TO INJECT INSULIN EVERY DAY 05/19/15   Altha Harm, MD  glipiZIDE (GLUCOTROL) 10 MG tablet TAKE 1 TABLET BY MOUTH TWICE DAILY BEFORE A MEAL 07/19/15   Henrietta Hoover, NP  hydrochlorothiazide (HYDRODIURIL) 25 MG tablet Take 1 tablet (25 mg total) by mouth every morning. 09/09/15   Henrietta Hoover, NP  insulin glargine (LANTUS) 100 UNIT/ML injection Inject 0.2 mLs (20 Units total) into the skin at bedtime. 04/02/15   Altha Harm, MD  Lancets (ACCU-CHEK MULTICLIX) lancets USE TO CHECK BLOOD SUGAR TWICE DAILY 03/24/15   Altha Harm, MD  lisinopril (PRINIVIL,ZESTRIL) 10 MG tablet Take 1 tablet (10 mg total) by mouth daily. 09/09/15   Henrietta Hoover, NP  Multiple Vitamins-Minerals (MULTIVITAMIN WITH MINERALS) tablet Take  1 tablet by mouth daily.    Historical Provider, MD  potassium chloride SA (K-DUR,KLOR-CON) 20 MEQ tablet Take 1 tablet (20 mEq total) by mouth daily. with food 06/17/15   Massie Maroon, FNP  pravastatin (PRAVACHOL) 40 MG tablet Take 1 tablet (40 mg total) by mouth daily. 09/09/15   Henrietta Hoover, NP  PROVENTIL HFA 108 (90 BASE) MCG/ACT inhaler INHALE 2 PUFFS BY MOUTH FOUR TIMES A DAY AS NEEDED FOR SHORTNESS OF BREATH 03/24/15   Altha Harm, MD  PROVENTIL HFA 108 (90 BASE) MCG/ACT inhaler INHALE 2 PUFFS BY MOUTH FOUR TIMES A DAY AS NEEDED FOR SHORTNESS OF BREATH 04/21/15   Altha Harm, MD   BP 135/91 mmHg  Pulse 88  Temp(Src) 97.1 F  (36.2 C) (Oral)  Resp 20  Ht  (1.727 m)  Wt 200 lb (90.719 kg)  BMI 30.42 kg/m2  SpO2 95%   Physical Exam  Constitutional: She is oriented to person, place, and time. She appears well-developed and well-nourished. No distress.  HENT:  Head: Normocephalic and atraumatic.  Eyes: Conjunctivae and EOM are normal.  Neck: Neck supple. No tracheal deviation present.  Cardiovascular: Normal rate.   Pulmonary/Chest: Effort normal. No respiratory distress.  Musculoskeletal: Normal range of motion. She exhibits tenderness.  TTP in the soft tissues of the right upper and forearm. Ulnar and radial pulses easily palpable. Motor and sensory intact throughout the entire hand.   Neurological: She is alert and oriented to person, place, and time.  Skin: Skin is warm and dry.  Psychiatric: She has a normal mood and affect. Her behavior is normal.  Nursing note and vitals reviewed.   ED Course  Procedures (including critical care time) DIAGNOSTIC STUDIES: Oxygen Saturation is 95% on RA, adequate by my interpretation.    COORDINATION OF CARE: 3:32 AM-Discussed treatment plan with pt at bedside and pt agreed to plan.     Labs Review Labs Reviewed - No data to display  Imaging Review No results found.    EKG Interpretation None      MDM   Final diagnoses:  None      Pain is clearly musculoskeletal in nature. There is no trauma and I do not feel as though any imaging is necessary. She will be placed in an arm sling treated with NSAIDs, and when necessary follow-up if not improving.   I personally performed the services described in this documentation, which was scribed in my presence. The recorded information has been reviewed and is accurate.      Geoffery Lyons, MD 09/12/15 [phone]

## 2015-09-12 NOTE — ED Notes (Signed)
Pt c/o right arm pain without numbness/tingling. Denies precipitating injury/trauma/fall. No hx DVT. Not on blood thinners. No obvious swelling/deformity. Denies unilateral numbness. Family says, "she has had a slight stroke in the past." No neurological deficits. Face symmetrical. No other c/c.

## 2015-09-12 NOTE — Discharge Instructions (Signed)
Wear arm sling as applied for the next several days.  Naproxen twice daily as prescribed.   Musculoskeletal Pain Musculoskeletal pain is muscle and boney aches and pains. These pains can occur in any part of the body. Your caregiver may treat you without knowing the cause of the pain. They may treat you if blood or urine tests, X-rays, and other tests were normal.  CAUSES There is often not a definite cause or reason for these pains. These pains may be caused by a type of germ (virus). The discomfort may also come from overuse. Overuse includes working out too hard when your body is not fit. Boney aches also come from weather changes. Bone is sensitive to atmospheric pressure changes. HOME CARE INSTRUCTIONS   Ask when your test results will be ready. Make sure you get your test results.  Only take over-the-counter or prescription medicines for pain, discomfort, or fever as directed by your caregiver. If you were given medications for your condition, do not drive, operate machinery or power tools, or sign legal documents for 24 hours. Do not drink alcohol. Do not take sleeping pills or other medications that may interfere with treatment.  Continue all activities unless the activities cause more pain. When the pain lessens, slowly resume normal activities. Gradually increase the intensity and duration of the activities or exercise.  During periods of severe pain, bed rest may be helpful. Lay or sit in any position that is comfortable.  Putting ice on the injured area.  Put ice in a bag.  Place a towel between your skin and the bag.  Leave the ice on for 15 to 20 minutes, 3 to 4 times a day.  Follow up with your caregiver for continued problems and no reason can be found for the pain. If the pain becomes worse or does not go away, it may be necessary to repeat tests or do additional testing. Your caregiver may need to look further for a possible cause. SEEK IMMEDIATE MEDICAL CARE IF:  You  have pain that is getting worse and is not relieved by medications.  You develop chest pain that is associated with shortness or breath, sweating, feeling sick to your stomach (nauseous), or throw up (vomit).  Your pain becomes localized to the abdomen.  You develop any new symptoms that seem different or that concern you. MAKE SURE YOU:   Understand these instructions.  Will watch your condition.  Will get help right away if you are not doing well or get worse. Document Released: 12/11/2005 Document Revised: 03/04/2012 Document Reviewed: 08/15/2013 [facility] Patient Information 2015 [place], [place]. This information is not intended to replace advice given to you by your health care provider. Make sure you discuss any questions you have with your health care provider.

## 2015-09-13 ENCOUNTER — Other Ambulatory Visit: Payer: Self-pay | Admitting: Family Medicine

## 2015-09-13 ENCOUNTER — Telehealth: Payer: Self-pay

## 2015-09-13 NOTE — Telephone Encounter (Signed)
-----   Message from Henrietta Hoover, NP sent at 09/13/2015  8:01 AM EDT ----- Vitamin D is a little better but still low. Need to send in prescription. Please confirm the pharmacy.

## 2015-09-13 NOTE — Telephone Encounter (Signed)
Called, no answer and no way to leave voicemail. Will try later. Thanks!

## 2015-09-14 ENCOUNTER — Telehealth: Payer: Self-pay | Admitting: *Deleted

## 2015-09-14 NOTE — Telephone Encounter (Signed)
-----   Message from Henrietta Hoover, NP sent at 09/10/2015 10:00 AM EDT -----  Diabetes is in much better control than last time. Continue current treatment.Other labs are acceptable.

## 2015-09-15 NOTE — Telephone Encounter (Signed)
Left message for call back regarding labs. Thanks!

## 2015-09-21 ENCOUNTER — Ambulatory Visit: Payer: Medicaid Other | Admitting: Sports Medicine

## 2015-10-04 ENCOUNTER — Other Ambulatory Visit: Payer: Self-pay | Admitting: Family Medicine

## 2015-10-05 ENCOUNTER — Ambulatory Visit (INDEPENDENT_AMBULATORY_CARE_PROVIDER_SITE_OTHER): Payer: Medicaid Other | Admitting: Sports Medicine

## 2015-10-05 ENCOUNTER — Encounter: Payer: Self-pay | Admitting: Sports Medicine

## 2015-10-05 VITALS — BP 130/90 | Ht 69.0 in | Wt 203.0 lb

## 2015-10-05 DIAGNOSIS — M25561 Pain in right knee: Secondary | ICD-10-CM

## 2015-10-05 DIAGNOSIS — M1711 Unilateral primary osteoarthritis, right knee: Secondary | ICD-10-CM

## 2015-10-05 DIAGNOSIS — M129 Arthropathy, unspecified: Secondary | ICD-10-CM

## 2015-10-05 MED ORDER — METHYLPREDNISOLONE ACETATE 40 MG/ML IJ SUSP
40.0000 mg | Freq: Once | INTRAMUSCULAR | Status: AC
  Administered 2015-10-05: 40 mg via INTRA_ARTICULAR

## 2015-10-05 NOTE — Progress Notes (Signed)
  HPI: [name] is a 59 y.o. female with a past medical history significant for malignant HTN and DM type II who presents to clinic with right knee pain.  She reports that he knee pain has been present for over 1 year.  She states that it began gradually and has worsened over time.  She denies any inciting event or exercise.  The pain is worse with standing and walking. She reports constant pain and swelling along the lateral aspect of her knee.  She  Has been unable to take NSAIDs secondary to issues stabilizing her HTN.  She also reports that she has not tried icing or heat for relief of pain.    Review of Systems  Musculoskeletal: Positive for joint pain.  All other systems reviewed and are negative.   Physical Exam: BP 130/90 mmHg  Ht  (1.753 m)  Wt 203 lb (92.08 kg)  BMI 29.96 kg/m2 Physical Exam  Constitutional: She appears well-developed and well-nourished. No distress.  Musculoskeletal:       Right knee: She exhibits decreased range of motion, swelling, effusion and deformity. She exhibits no erythema. Tenderness found. Lateral joint line tenderness noted.       Left knee: Normal.  Neurological: She has normal strength. No sensory deficit. Gait normal.  Reflex Scores:      Patellar reflexes are 2+ on the right side and 2+ on the left side. Gait: genus valgus with pronation bilaterally  ROM: Knee full flexion and extension bilaterally, Hip- limited external and internal rotation on R, L normal   05/30/2013 Right knee X-ray: Findings: Moderate to severe tricompartmental degenerative changes are identified with a small joint effusion. There is no evidence of acute fracture, subluxation or dislocation. No focal bony lesions are present.  Assessment/Plan [name] is a 59 y.o. presenting with right knee pain and physical exam and x-ray findings consistent with severe right knee OA. We recommend the use of a lateral unloading brace to help to decrease some of the stress  placed on her lateral compartment, unfortunately the brace was not available at the time of this visit so we will try a simple compression sleeve. Based on her x-ray findings. Today she received a 3:1 corticosteroid injection to help decrease some of the swelling and pain she has been experiencing.   - Open knee brace to wear during the day - Consider lateral unloading brace in the future - Follow-up in 4 weeks to re-evaluate    Patient seen and evaluated with the medical student. I agree with the above plan of care. This patient has advanced lateral compartmental DJD of her right knee. Definitive treatment is a total knee arthroplasty but her comorbid medical conditions may preclude her from this. I recommended that we try a simple intra-articular cortisone injection and have her follow-up in 4 weeks. Hopefully she will receive some benefit from the injection.  Consent obtained and verified. Time-out conducted. Noted no overlying erythema, induration, or other signs of local infection. Skin prepped in a sterile fashion. Topical analgesic spray: Ethyl chloride. Joint: right knee Needle: 22g 1.5 inch Completed without difficulty. Meds: 3cc 1% xylocaine, 1cc ( ) depomedrol  Advised to call if fevers/chills, erythema, induration, drainage, or persistent bleeding.

## 2015-10-08 ENCOUNTER — Other Ambulatory Visit: Payer: Self-pay | Admitting: Family Medicine

## 2015-10-19 ENCOUNTER — Other Ambulatory Visit: Payer: Self-pay | Admitting: Family Medicine

## 2015-11-01 ENCOUNTER — Other Ambulatory Visit: Payer: Self-pay | Admitting: Family Medicine

## 2015-11-01 ENCOUNTER — Encounter: Payer: Self-pay | Admitting: Sports Medicine

## 2015-11-01 ENCOUNTER — Ambulatory Visit (INDEPENDENT_AMBULATORY_CARE_PROVIDER_SITE_OTHER): Payer: Medicaid Other | Admitting: Sports Medicine

## 2015-11-01 VITALS — BP 121/74 | Ht 69.0 in | Wt 203.0 lb

## 2015-11-01 DIAGNOSIS — M129 Arthropathy, unspecified: Secondary | ICD-10-CM | POA: Diagnosis not present

## 2015-11-01 DIAGNOSIS — M1711 Unilateral primary osteoarthritis, right knee: Secondary | ICD-10-CM

## 2015-11-01 MED ORDER — TRAMADOL HCL 50 MG PO TABS
ORAL_TABLET | ORAL | Status: DC
Start: 2015-11-01 — End: 2019-10-16

## 2015-11-01 NOTE — Progress Notes (Signed)
   Subjective:    Patient ID: Candace Diaz, female    DOB: 18-Mar-1956, 59 y.o.   MRN: [id number]  HPI   Candace Diaz is a 59 year old female with multiple medical problems and severe tricompartmental osteoarthritis of the right knee presenting for follow-up of the latter. Patient reports that intra-articular steroid injection performed on 10/05/2015 provided significant relief, and dissipated most of her swelling after approximately 5-7 days. She had great pain relief until late October but then had a return of pain and swelling gradually. She now is back to her preinjection baseline.  She reports that pain is most severe when walking up/down ramps or uneven surfaces, she avoids stairs. Pain is always most severe when walking down/down in the activities described above. She has baseline edema bilateral legs but more intra-articular edema of the right knee compared to the left.  Occasional instability secondary to pain/poor quad firing. No paresthesias. No weakness distal to the knee. No radiculopathy.   PMHx: -- Reviewed & updated in EMR  PSHx: -- Reviewed & updated in EMR   Meds: -- Reviewed & updated in EMR  Allergies: -- Reviewed & updated in EMR  Social Hx: -- Reviewed & updated in EMR    Review of Systems  10 point review of systems was negative other than the above     Objective:   Physical Exam   General: -- Well appearing and in NAD; resting comfortably on exam table  Cardiopulmonary: -- Non-labored respirations -- No JVD; 2+ pulses in bilateral lower extremities  Neurology: -- Patient with difficulty hearing, and slurred speech which she reports is her baseline. She is able to communicate by reading lips and answers all questions appropriately -- Neurovascular intact in examined extremities  Psych: -- Appropriate mood/affect; normal thought content  Musculoskeletal KNEE exam: -The RIGHT knee demonstrates 2+ effusion relative to the left. Visually unable to  appreciate the contours of the joint line and patellar tendon. -At baseline the RIGHT patella is subluxed laterally, with the medial border of the patella resting at approximately the mid femur. -Very tender to palpation along both joint lines and inferior aspect of patella, most severe at medial femoral condyle -5/5 strength in knee flexion and extension bilaterally -Negative apprehension, but positive patellar compression on the right -Negative anterior drawer, posterior drawer and Lachman -Negative valgus and varus stress testing for laxity; both directions causes significant joint line pain on the stressed side -On standing evaluation there is a valgus deformity of the RIGHT leg, patellar alta bilaterally on exam/inspection      Assessment & Plan:   59 year old female with numerous medical problems presented for follow-up on tricompartmental RIGHT knee osteoarthritis with significant benefit from previous intra-articular corticosteroid injection but limited time course of relief.  -Would recommend scheduled Tylenol, 1000 mg 3 times a day but given relatively stable elevation in her transaminases from most recent CMP and December 2015 CMP..... Would prefer the patient discussed this option with her primary care physician -Defer scheduled Tylenol recommendation to her primary care physician (currently using when necessary) -Prescription written for 50 mg tramadol for breakthrough pain today; she may take 1-2 tabs every 12 hours as needed for osteoarthritis pain of the RIGHT knee -Continue home strengthening exercises for quadriceps and hamstring -Follow up with sports medicine in 2 or more months for a repeat RIGHT intra-articular steroid injection as needed for further pain control

## 2015-11-05 ENCOUNTER — Other Ambulatory Visit: Payer: Self-pay | Admitting: Family Medicine

## 2015-11-09 ENCOUNTER — Other Ambulatory Visit: Payer: Self-pay | Admitting: Family Medicine

## 2015-11-10 ENCOUNTER — Other Ambulatory Visit: Payer: Self-pay | Admitting: Family Medicine

## 2015-11-29 ENCOUNTER — Other Ambulatory Visit: Payer: Self-pay | Admitting: Family Medicine

## 2015-11-29 ENCOUNTER — Other Ambulatory Visit: Payer: Self-pay | Admitting: Internal Medicine

## 2015-12-02 ENCOUNTER — Other Ambulatory Visit: Payer: Self-pay | Admitting: Family Medicine

## 2015-12-02 ENCOUNTER — Telehealth: Payer: Self-pay

## 2015-12-02 MED ORDER — DILTIAZEM HCL 120 MG PO TABS: 120.0000 mg | ORAL_TABLET | Freq: Three times a day (TID) | ORAL | Status: DC

## 2015-12-02 MED ORDER — AMLODIPINE BESYLATE 10 MG PO TABS: 10.0000 mg | ORAL_TABLET | Freq: Every day | ORAL | Status: DC

## 2015-12-02 NOTE — Telephone Encounter (Signed)
Refills have been sent into pharmacy. Thanks!  

## 2015-12-02 NOTE — Telephone Encounter (Signed)
Patient is to stop ditelizem and follow up in 1 week.

## 2015-12-03 ENCOUNTER — Other Ambulatory Visit: Payer: Self-pay | Admitting: Internal Medicine

## 2015-12-24 ENCOUNTER — Other Ambulatory Visit: Payer: Self-pay | Admitting: Internal Medicine

## 2015-12-30 ENCOUNTER — Other Ambulatory Visit: Payer: Self-pay | Admitting: Internal Medicine

## 2016-01-10 ENCOUNTER — Ambulatory Visit: Payer: Medicaid Other | Admitting: Family Medicine

## 2016-01-24 ENCOUNTER — Ambulatory Visit (INDEPENDENT_AMBULATORY_CARE_PROVIDER_SITE_OTHER): Payer: Medicaid Other | Admitting: Family Medicine

## 2016-01-24 ENCOUNTER — Encounter: Payer: Self-pay | Admitting: Family Medicine

## 2016-01-24 VITALS — BP 139/88 | HR 93 | Temp 97.9°F | Ht 67.0 in | Wt 224.0 lb

## 2016-01-24 DIAGNOSIS — I10 Essential (primary) hypertension: Secondary | ICD-10-CM

## 2016-01-24 DIAGNOSIS — E559 Vitamin D deficiency, unspecified: Secondary | ICD-10-CM | POA: Diagnosis not present

## 2016-01-24 DIAGNOSIS — E1310 Other specified diabetes mellitus with ketoacidosis without coma: Secondary | ICD-10-CM

## 2016-01-24 DIAGNOSIS — Z23 Encounter for immunization: Secondary | ICD-10-CM

## 2016-01-24 DIAGNOSIS — E1110 Type 2 diabetes mellitus with ketoacidosis without coma: Secondary | ICD-10-CM

## 2016-01-24 LAB — CBC WITH DIFFERENTIAL/PLATELET
BASOS ABS: 0.1 10*3/uL (ref 0.0–0.1)
Basophils Relative: 1 % (ref 0–1)
EOS ABS: 0.2 10*3/uL (ref 0.0–0.7)
EOS PCT: 3 % (ref 0–5)
HCT: 37.8 % (ref 36.0–46.0)
Hemoglobin: 12 g/dL (ref 12.0–15.0)
Lymphocytes Relative: 51 % — ABNORMAL HIGH (ref 12–46)
Lymphs Abs: 2.8 10*3/uL (ref 0.7–4.0)
MCH: 28.2 pg (ref 26.0–34.0)
MCHC: 31.7 g/dL (ref 30.0–36.0)
MCV: 88.7 fL (ref 78.0–100.0)
MONO ABS: 0.4 10*3/uL (ref 0.1–1.0)
MPV: 9.6 fL (ref 8.6–12.4)
Monocytes Relative: 8 % (ref 3–12)
Neutro Abs: 2 10*3/uL (ref 1.7–7.7)
Neutrophils Relative %: 37 % — ABNORMAL LOW (ref 43–77)
PLATELETS: 366 10*3/uL (ref 150–400)
RBC: 4.26 MIL/uL (ref 3.87–5.11)
RDW: 13.8 % (ref 11.5–15.5)
WBC: 5.4 10*3/uL (ref 4.0–10.5)

## 2016-01-24 LAB — COMPLETE METABOLIC PANEL WITH GFR
ALBUMIN: 4.3 g/dL (ref 3.6–5.1)
ALK PHOS: 61 U/L (ref 33–130)
ALT: 61 U/L — AB (ref 6–29)
AST: 46 U/L — ABNORMAL HIGH (ref 10–35)
BILIRUBIN TOTAL: 0.7 mg/dL (ref 0.2–1.2)
BUN: 9 mg/dL (ref 7–25)
CALCIUM: 9.5 mg/dL (ref 8.6–10.4)
CO2: 28 mmol/L (ref 20–31)
CREATININE: 0.78 mg/dL (ref 0.50–1.05)
Chloride: 101 mmol/L (ref 98–110)
GFR, Est Non African American: 84 mL/min (ref >=60)
Glucose, Bld: 52 mg/dL — ABNORMAL LOW (ref 65–99)
Potassium: 3.3 mmol/L — ABNORMAL LOW (ref 3.5–5.3)
Sodium: 141 mmol/L (ref 135–146)
TOTAL PROTEIN: 7.7 g/dL (ref 6.1–8.1)

## 2016-01-24 LAB — LIPID PANEL
Cholesterol: 135 mg/dL (ref 125–200)
HDL: 53 mg/dL (ref >=46)
LDL CALC: 66 mg/dL (ref <130)
TRIGLYCERIDES: 81 mg/dL (ref <150)
Total CHOL/HDL Ratio: 2.5 Ratio (ref <=5.0)
VLDL: 16 mg/dL (ref <30)

## 2016-01-24 LAB — HEPATITIS C ANTIBODY: HCV Ab: NEGATIVE

## 2016-01-24 LAB — HIV ANTIBODY (ROUTINE TESTING W REFLEX): HIV: NONREACTIVE

## 2016-01-24 MED ORDER — PNEUMOCOCCAL 13-VAL CONJ VACC IM SUSP
0.5000 mL | Freq: Once | INTRAMUSCULAR | Status: AC
  Administered 2016-01-24: 0.5 mL via INTRAMUSCULAR

## 2016-01-24 MED ORDER — PNEUMOCOCCAL 13-VAL CONJ VACC IM SUSP: 0.5000 mL | Freq: Once | INTRAMUSCULAR | Status: DC

## 2016-01-24 NOTE — Progress Notes (Signed)
Patient ID: Candace Diaz, female   DOB: 21-Feb-1956, 60 y.o.   MRN: [id number]   Candace Diaz, is a 60 y.o. female  [id number]  [id number]  DOB - 1956-05-18  CC:  Chief Complaint  Patient presents with  . Diabetes    Follow up on HTN Diabetes, patient is checking glucose at home and it is running 80-100         HPI: Candace Diaz is a 60 y.o. female here for  follow-up of diabetes. She reports her BS at home generally run between 80 and 100. She reports trying to follow a diabetic diet. She takes her medications regularly but does not exercise regularly. She denies any complaints or concerns to be addressed at this visit. A review of health maintenance shows she has had pneumococcal 23 but not prevnar.  Allergies  Allergen Reactions  . Aspirin Other (See Comments)    Reaction uknown  . Penicillins Other (See Comments)    Reaction unknown   Past Medical History  Diagnosis Date  . Hypertension   . Diabetes mellitus    Current Outpatient Prescriptions on File Prior to Visit  Medication Sig Dispense Refill  . ACCU-CHEK AVIVA PLUS test strip USE TO CHECK BLOOD SUGAR TWICE DAILY 60 each 4  . amLODipine (NORVASC) 10 MG tablet TAKE ONE TABLET BY MOUTH EVERY DAY 30 tablet 3  . EASY TOUCH INSULIN SYRINGE 31G X 5/16" 0.5 ML MISC USE TO INJECT INSULIN EVERY DAY 30 each 3  . glipiZIDE (GLUCOTROL) 10 MG tablet TAKE 1 TABLET BY MOUTH TWICE DAILY BEFORE A MEAL 60 tablet 3  . hydrochlorothiazide (HYDRODIURIL) 25 MG tablet Take 1 tablet (25 mg total) by mouth every morning. 90 tablet 3  . Lancets (ACCU-CHEK MULTICLIX) lancets USE TO CHECK BLOOD SUGAR TWICE DAILY 102 each 2  . LANTUS 100 UNIT/ML injection INJECT 20 UNITS SUBCUTANEOUSLY AT BEDTIME 10 mL 3  . lisinopril (PRINIVIL,ZESTRIL) 10 MG tablet Take 1 tablet (10 mg total) by mouth daily. 90 tablet 3  . Multiple Vitamins-Minerals (MULTIVITAMIN WITH MINERALS) tablet Take 1 tablet by mouth daily.    . potassium chloride SA (K-DUR,KLOR-CON)  20 MEQ tablet Take 1 tablet (20 mEq total) by mouth daily. with food 30 tablet 6  . pravastatin (PRAVACHOL) 40 MG tablet Take 1 tablet (40 mg total) by mouth daily. 90 tablet 3  . PROVENTIL HFA 108 (90 BASE) MCG/ACT inhaler INHALE 2 PUFFS BY MOUTH FOUR TIMES A DAY AS NEEDED FOR SHORTNESS OF BREATH 6.7 g 3  . traMADol (ULTRAM) 50 MG tablet Take 1 to 2 tablets twice a day as needed for pain 60 tablet 2  . acetaminophen (TYLENOL) 500 MG tablet Take 1 tablet (500 mg total) by mouth every 6 (six) hours as needed for pain. (Patient not taking: Reported on 01/24/2016) 30 tablet 0  . amLODipine (NORVASC) 10 MG tablet Take 1 tablet (10 mg total) by mouth daily. 90 tablet 2  . cloNIDine (CATAPRES) 0.3 MG tablet Take 1 tablet (0.3 mg total) by mouth daily. (Patient not taking: Reported on 09/12/2015) 30 tablet 3  . Lancets (ACCU-CHEK MULTICLIX) lancets USE TO CHECK BLOOD SUGAR TWICE DAILY 102 each 2  . Lancets (ACCU-CHEK MULTICLIX) lancets USE TO CHECK BLOOD SUGAR TWICE DAILY 102 each 2  . lisinopril (PRINIVIL,ZESTRIL) 10 MG tablet TAKE 1 TABLET BY MOUTH EVERY DAY 30 tablet 5  . naproxen (NAPROSYN) 500 MG tablet Take 1 tablet (500 mg total) by mouth 2 (two) times daily. (Patient not taking:  Reported on 01/24/2016) 20 tablet 0  . PROVENTIL HFA 108 (90 BASE) MCG/ACT inhaler INHALE 2 PUFFS BY MOUTH FOUR TIMES A DAY AS NEEDED FOR SHORTNESS OF BREATH 6.7 g 3  . PROVENTIL HFA 108 (90 Base) MCG/ACT inhaler INHALE 2 PUFFS BY MOUTH FOUR TIMES A DAY AS NEEDED FOR SHORTNESS OF BREATH 6.7 each 2  . PROVENTIL HFA 108 (90 Base) MCG/ACT inhaler INHALE 2 PUFFS BY MOUTH FOUR TIMES A DAY AS NEEDED FOR SHORTNESS OF BREATH 6.7 each 2   No current facility-administered medications on file prior to visit.   Family History  Problem Relation Age of Onset  . Diabetes Brother   . Hypertension Brother   . Diabetes Other   . Hypertension Other    Social History   Social History  . Marital Status: Single    Spouse Name: N/A  .  Number of Children: N/A  . Years of Education: N/A   Occupational History  . Not on file.   Social History Main Topics  . Smoking status: Never Smoker   . Smokeless tobacco: Never Used  . Alcohol Use: No  . Drug Use: No  . Sexual Activity: Not on file   Other Topics Concern  . Not on file   Social History Narrative    Review of Systems: Constitutional: Negative for weight change, appetite change, fatigue Skin: Negative for ulcerations. HEENT:  Decreased hearing since childhood Eyes: Negative pain, visual disturbance Respiratory: Negative for cough, shortness of breath,   Cardiovascular: Negative for chest pain, palpitations, pedal edema  Abdominal:Negative for nausea, vomiting, diarhea,constipation Genitourinary: Negative for frequency, urgency, polyuria Neurological: Negative for numbness, tingling,pain in feet Psychiatric/Behavioral: Negative for depression, anxiety, confusion   Objective:   Filed Vitals:   01/24/16 0810  BP: 139/88  Pulse: 93  Temp: 97.9 F (36.6 C)    Physical Exam: Constitutional: Patient appears well-developed and well-nourished. No distress. HENT: Normocephalic, atraumatic. Oropharynx is clear and moist.  Eyes: Conjunctivae and EOM are normal. PERRLA, no scleral icterus.Sclera muddy. Neck: Normal ROM. Neck supple. No lymphadenopathy, No thyromegaly. Mouth: Teeth in poor repair CVS: RRR, S1/S2 +, no murmurs, no gallops, no rubs Pulmonary: Effort and breath sounds normal, no stridor, rhonchi, wheezes, rales.  Abdominal: Soft. Normoactive BS,, no distension, tenderness, rebound or guarding.  Musculoskeletal: Normal range of motion. No edema and no tenderness.  Neuro: Alert.Normal muscle tone coordination. Non-focal Skin: Skin is warm and dry. No rash noted. Not diaphoretic. No erythema. No pallor. Psychiatric: Normal mood and affect. Behavior, judgment, thought content normal.  Lab Results  Component Value Date   WBC 6.3 09/09/2015   HGB  11.4* 09/09/2015   HCT 35.4* 09/09/2015   MCV 88.9 09/09/2015   PLT 364 09/09/2015   Lab Results  Component Value Date   CREATININE 0.79 09/09/2015   BUN 16 09/09/2015   NA 140 09/09/2015   K 3.8 09/09/2015   CL 102 09/09/2015   CO2 29 09/09/2015    Lab Results  Component Value Date   HGBA1C 7.4* 09/09/2015   Lipid Panel     Component Value Date/Time   CHOL 139 09/09/2015 1035   TRIG 94 09/09/2015 1035   HDL 52 09/09/2015 1035   CHOLHDL 2.7 09/09/2015 1035   VLDL 19 09/09/2015 1035   LDLCALC 68 09/09/2015 1035       Assessment and plan:    1. Type II Diabetes, adequate control.  - COMPLETE METABOLIC PANEL WITH GFR - CBC w/Diff - Lipid panel. -Continue  current medictions. Does not need refills. -Continue diabetic. Try to exercise regularly.   2. Need for prophylactic vaccination against Streptococcus pneumoniae (pneumococcus)  - pneumococcal 13-valent conjugate vaccine (PREVNAR 13) injection 0.5 mL; Inject 0.5 mLs into the muscle once.  3. HTN (hypertension), malignant, under good control -continue current medications   4. Vitamin D deficiency - Vitamin D 1,25 dihydroxy  5. Health maintenance - HIV antibody (with reflex) - Hepatitis C Antibody   Return in about 3 months (around 04/23/2016).  The patient was given clear instructions to go to ER or return to medical center if symptoms don't improve, worsen or new problems develop. The patient verbalized understanding       01/24/2016, 8:47 AM

## 2016-01-24 NOTE — Patient Instructions (Signed)
Continue current medications Continue to be careful of carbs and fats and cholesterol Return for recheck in 3 months.

## 2016-01-27 ENCOUNTER — Telehealth: Payer: Self-pay

## 2016-01-27 LAB — VITAMIN D 1,25 DIHYDROXY
VITAMIN D 1, 25 (OH) TOTAL: 32 pg/mL (ref 18–72)
VITAMIN D3 1, 25 (OH): 32 pg/mL

## 2016-01-27 NOTE — Telephone Encounter (Signed)
Called, no answer and no way to leave a message. Will try later. Thanks!  

## 2016-01-27 NOTE — Telephone Encounter (Signed)
-----   Message from Henrietta Hoover, NP sent at 01/27/2016  8:11 AM EST ----- Vitamin D wnl. If taking Vitamin D continue. HIV negative. Hepatitis C negative. Potassium slightly low. Continue Potassium supplement. Restart if you have not been taking.

## 2016-01-28 ENCOUNTER — Other Ambulatory Visit: Payer: Self-pay | Admitting: Family Medicine

## 2016-01-28 NOTE — Telephone Encounter (Signed)
Called and left message for patient to return call. Thanks.

## 2016-01-28 NOTE — Telephone Encounter (Signed)
Candace Diaz I advised patient of labs and she has been taking potassium. Please call if any changes. Thanks!

## 2016-02-10 ENCOUNTER — Emergency Department (HOSPITAL_COMMUNITY): Payer: Medicaid Other

## 2016-02-10 ENCOUNTER — Encounter (HOSPITAL_COMMUNITY): Payer: Self-pay | Admitting: Emergency Medicine

## 2016-02-10 ENCOUNTER — Emergency Department (HOSPITAL_COMMUNITY)
Admission: EM | Admit: 2016-02-10 | Discharge: 2016-02-10 | Disposition: A | Discharge status: Home / Self Care | Payer: Medicaid Other | Attending: Emergency Medicine | Admitting: Emergency Medicine

## 2016-02-10 DIAGNOSIS — Z880 Allergy status to penicillin: Secondary | ICD-10-CM | POA: Insufficient documentation

## 2016-02-10 DIAGNOSIS — Z79899 Other long term (current) drug therapy: Secondary | ICD-10-CM | POA: Insufficient documentation

## 2016-02-10 DIAGNOSIS — M545 Low back pain: Secondary | ICD-10-CM | POA: Diagnosis present

## 2016-02-10 DIAGNOSIS — R222 Localized swelling, mass and lump, trunk: Secondary | ICD-10-CM | POA: Diagnosis not present

## 2016-02-10 DIAGNOSIS — E119 Type 2 diabetes mellitus without complications: Secondary | ICD-10-CM | POA: Insufficient documentation

## 2016-02-10 DIAGNOSIS — I10 Essential (primary) hypertension: Secondary | ICD-10-CM | POA: Diagnosis not present

## 2016-02-10 DIAGNOSIS — Z7984 Long term (current) use of oral hypoglycemic drugs: Secondary | ICD-10-CM | POA: Diagnosis not present

## 2016-02-10 LAB — CBC WITH DIFFERENTIAL/PLATELET
BASOS ABS: 0 10*3/uL (ref 0.0–0.1)
Basophils Relative: 0 %
Eosinophils Absolute: 0.1 10*3/uL (ref 0.0–0.7)
Eosinophils Relative: 1 %
HCT: 37.9 % (ref 36.0–46.0)
HEMOGLOBIN: 12.1 g/dL (ref 12.0–15.0)
LYMPHS PCT: 41 %
Lymphs Abs: 2.3 10*3/uL (ref 0.7–4.0)
MCH: 28.9 pg (ref 26.0–34.0)
MCHC: 31.9 g/dL (ref 30.0–36.0)
MCV: 90.5 fL (ref 78.0–100.0)
MONO ABS: 0.3 10*3/uL (ref 0.1–1.0)
MONOS PCT: 5 %
NEUTROS ABS: 3 10*3/uL (ref 1.7–7.7)
NEUTROS PCT: 53 %
Platelets: 381 10*3/uL (ref 150–400)
RBC: 4.19 MIL/uL (ref 3.87–5.11)
RDW: 13.4 % (ref 11.5–15.5)
WBC: 5.7 10*3/uL (ref 4.0–10.5)

## 2016-02-10 LAB — I-STAT CHEM 8, ED
BUN: 14 mg/dL (ref 6–20)
CREATININE: 0.8 mg/dL (ref 0.44–1.00)
Calcium, Ion: 1.12 mmol/L (ref 1.12–1.23)
Chloride: 102 mmol/L (ref 101–111)
Glucose, Bld: 73 mg/dL (ref 65–99)
HEMATOCRIT: 41 % (ref 36.0–46.0)
HEMOGLOBIN: 13.9 g/dL (ref 12.0–15.0)
POTASSIUM: 4 mmol/L (ref 3.5–5.1)
SODIUM: 142 mmol/L (ref 135–145)
TCO2: 31 mmol/L (ref 0–100)

## 2016-02-10 LAB — CBG MONITORING, ED: Glucose-Capillary: 81 mg/dL (ref 65–99)

## 2016-02-10 MED ORDER — OXYCODONE-ACETAMINOPHEN 5-325 MG PO TABS: 1.0000 | ORAL_TABLET | ORAL | Status: DC | PRN

## 2016-02-10 MED ORDER — METHOCARBAMOL 500 MG PO TABS: 500.0000 mg | ORAL_TABLET | Freq: Two times a day (BID) | ORAL | Status: DC

## 2016-02-10 MED ORDER — ONDANSETRON HCL 4 MG/2ML IJ SOLN
4.0000 mg | Freq: Once | INTRAMUSCULAR | Status: AC | PRN
  Administered 2016-02-10: 4 mg via INTRAVENOUS
  Filled 2016-02-10: qty 2

## 2016-02-10 MED ORDER — OXYCODONE-ACETAMINOPHEN 5-325 MG PO TABS
1.0000 | ORAL_TABLET | Freq: Once | ORAL | Status: AC
  Administered 2016-02-10: 1 via ORAL
  Filled 2016-02-10: qty 1

## 2016-02-10 MED ORDER — IOHEXOL 300 MG/ML  SOLN
100.0000 mL | Freq: Once | INTRAMUSCULAR | Status: AC | PRN
  Administered 2016-02-10: 100 mL via INTRAVENOUS

## 2016-02-10 MED ORDER — METHOCARBAMOL 500 MG PO TABS
1000.0000 mg | ORAL_TABLET | Freq: Once | ORAL | Status: AC
  Administered 2016-02-10: 1000 mg via ORAL
  Filled 2016-02-10: qty 2

## 2016-02-10 MED ORDER — LIDOCAINE 5 % EX PTCH: 1.0000 | MEDICATED_PATCH | CUTANEOUS | Status: DC

## 2016-02-10 MED ORDER — MORPHINE SULFATE (PF) 4 MG/ML IV SOLN
4.0000 mg | Freq: Once | INTRAVENOUS | Status: AC
  Administered 2016-02-10: 4 mg via INTRAVENOUS
  Filled 2016-02-10: qty 1

## 2016-02-10 NOTE — ED Notes (Signed)
Patient transported to CT 

## 2016-02-10 NOTE — Discharge Instructions (Signed)
You have been seen today for pain and a back mass. Your imaging and lab tests showed no abnormalities. Follow up with PCP as soon as possible if the mass persists. Return to ED should symptoms worsen. May take Tylenol or Percocet for pain.

## 2016-02-10 NOTE — ED Notes (Signed)
Patient presents for left side lower back pain with knot x3 days. Denies injury to same. Denies numbness or tingling, denies urinary symptoms. Rates pain 10/10.

## 2016-02-10 NOTE — ED Provider Notes (Signed)
CSN: [id number]     Arrival date & time 02/10/16  1432 History  By signing my name below, I, Candace Diaz, attest that this documentation has been prepared under the direction and in the presence of Tavyn Kurka PA-C. Electronically Signed: Bethel Diaz, ED Scribe. 02/10/2016 4:09 PM   Chief Complaint  Patient presents with  . Back Pain    The history is provided by the patient. No language interpreter was used.   Candace Diaz is a 60 y.o. female with PMHx of HTN and DM who presents to the Emergency Department complaining of new, atraumatic, constant, 10/10 in severity, left lower back pain with onset 2-3 days ago. A relative at bedside states that there is a "bulge" at the affected area that was first noticed today by the patient's daughter. The pain is worse with standing up but she has been ambulatory.  Pt denies falls or trauma, fever, chills, nausea, vomiting, difficulty urinating, neuro deficits, or any other complaints. No history of HIV, IV drug use, back surgery, or kidney disease.   Past Medical History  Diagnosis Date  . Hypertension   . Diabetes mellitus    History reviewed. No pertinent past surgical history. Family History  Problem Relation Age of Onset  . Diabetes Brother   . Hypertension Brother   . Diabetes Other   . Hypertension Other    Social History  Substance Use Topics  . Smoking status: Never Smoker   . Smokeless tobacco: Never Used  . Alcohol Use: No   OB History    No data available     Review of Systems  Constitutional: Negative for fever and chills.  Gastrointestinal: Negative for nausea and vomiting.  Genitourinary: Negative for difficulty urinating.  Musculoskeletal: Positive for back pain.  Neurological: Negative for weakness and numbness.  All other systems reviewed and are negative.   Allergies  Aspirin and Penicillins  Home Medications   Prior to Admission medications   Medication Sig Start Date End Date Taking? Authorizing  Provider  ACCU-CHEK AVIVA PLUS test strip USE TO CHECK BLOOD SUGAR TWICE DAILY 12/06/15   Henrietta Hoover, NP  amLODipine (NORVASC) 10 MG tablet TAKE ONE TABLET BY MOUTH EVERY DAY 09/13/15   Henrietta Hoover, NP  amLODipine (NORVASC) 10 MG tablet Take 1 tablet (10 mg total) by mouth daily. 12/02/15   Henrietta Hoover, NP  EASY [facility] INSULIN SYRINGE 31G X 5/16" 0.5 ML MISC USE TO INJECT INSULIN EVERY DAY 05/19/15   Altha Harm, MD  glipiZIDE (GLUCOTROL) 10 MG tablet TAKE 1 TABLET BY MOUTH 2 TIMES A DAY BEFORE MEAL 01/31/16   Henrietta Hoover, NP  hydrochlorothiazide (HYDRODIURIL) 25 MG tablet Take 1 tablet (25 mg total) by mouth every morning. 09/09/15   Henrietta Hoover, NP  Lancets (ACCU-CHEK MULTICLIX) lancets USE TO CHECK BLOOD SUGAR TWICE DAILY 12/06/15   Henrietta Hoover, NP  Lancets (ACCU-CHEK MULTICLIX) lancets USE TO CHECK BLOOD SUGAR TWICE DAILY 12/24/15   Henrietta Hoover, NP  Lancets (ACCU-CHEK MULTICLIX) lancets USE TO CHECK BLOOD SUGAR TWICE DAILY 12/24/15   Henrietta Hoover, NP  LANTUS 100 UNIT/ML injection INJECT 20 UNITS SUBCUTANEOUSLY AT BEDTIME 11/29/15   Massie Maroon, FNP  lidocaine (LIDODERM) 5 % Place 1 patch onto the skin daily. Remove & Discard patch within 12 hours or as directed by MD 02/10/16   Hillard Danker Cova Knieriem, PA-C  lisinopril (PRINIVIL,ZESTRIL) 10 MG tablet Take 1 tablet (10 mg total) by mouth daily.  09/09/15   Henrietta Hoover, NP  lisinopril (PRINIVIL,ZESTRIL) 10 MG tablet TAKE 1 TABLET BY MOUTH EVERY DAY 09/13/15   Henrietta Hoover, NP  methocarbamol (ROBAXIN) 500 MG tablet Take 1 tablet (500 mg total) by mouth 2 (two) times daily. 02/10/16   Jaegar Croft C Kaspian Muccio, PA-C  Multiple Vitamins-Minerals (MULTIVITAMIN WITH MINERALS) tablet Take 1 tablet by mouth daily.    Historical Provider, MD  oxyCODONE-acetaminophen (PERCOCET/ROXICET) 5-325 MG tablet Take 1-2 tablets by mouth every 4 (four) hours as needed for severe pain. 02/10/16   Ronnetta Currington C Darria Corvera, PA-C  potassium chloride SA  (K-DUR,KLOR-CON) 20 MEQ tablet Take 1 tablet (20 mEq total) by mouth daily. with food 06/17/15   Massie Maroon, FNP  pravastatin (PRAVACHOL) 40 MG tablet Take 1 tablet (40 mg total) by mouth daily. 09/09/15   Henrietta Hoover, NP  PROVENTIL HFA 108 (90 BASE) MCG/ACT inhaler INHALE 2 PUFFS BY MOUTH FOUR TIMES A DAY AS NEEDED FOR SHORTNESS OF BREATH 03/24/15   Altha Harm, MD  PROVENTIL HFA 108 (90 BASE) MCG/ACT inhaler INHALE 2 PUFFS BY MOUTH FOUR TIMES A DAY AS NEEDED FOR SHORTNESS OF BREATH 12/06/15   Henrietta Hoover, NP  PROVENTIL HFA 108 [phone] Base) MCG/ACT inhaler INHALE 2 PUFFS BY MOUTH FOUR TIMES A DAY AS NEEDED FOR SHORTNESS OF BREATH 12/24/15   Henrietta Hoover, NP  PROVENTIL HFA 108 [phone] Base) MCG/ACT inhaler INHALE 2 PUFFS BY MOUTH FOUR TIMES A DAY AS NEEDED FOR SHORTNESS OF BREATH 12/24/15   Henrietta Hoover, NP  traMADol (ULTRAM) 50 MG tablet Take 1 to 2 tablets twice a day as needed for pain 11/01/15   Ozzie Hoyle Draper, DO   BP 134/94 mmHg  Pulse 77  Temp(Src) 99 F (37.2 C) (Oral)  Resp 20  SpO2 98% Physical Exam  Constitutional: She is oriented to person, place, and time. She appears well-developed and well-nourished. No distress.  HENT:  Head: Normocephalic and atraumatic.  Eyes: Conjunctivae are normal.  Neck: Normal range of motion. Neck supple.  Cardiovascular: Normal rate.   Pulmonary/Chest: Effort normal. No respiratory distress.  Musculoskeletal: Normal range of motion.  Mass in the left lumbar musculature that is approximately the size of half a tennis ball. The mass is spongy, but the patient voices increased pain with palpation. Full ROM in all extremities and spine. No paraspinal tenderness.   Neurological: She is alert and oriented to person, place, and time. She has normal reflexes.  No sensory deficits. Strength 5/5 in all extremities. No gait disturbance. Coordination intact.   Skin: Skin is warm and dry.  Psychiatric: She has a normal mood and  affect. Her behavior is normal.  Nursing note and vitals reviewed.   ED Course  Procedures (including critical care time) DIAGNOSTIC STUDIES: Oxygen Saturation is 98% on RA,  normal by my interpretation.    COORDINATION OF CARE: 3:40 PM Discussed treatment plan which includes Robaxin and Percocet with pt at bedside and pt agreed to plan.  Labs Review Labs Reviewed  CBC WITH DIFFERENTIAL/PLATELET  I-STAT CHEM 8, ED  CBG MONITORING, ED    Imaging Review Ct Abdomen Pelvis W Contrast  02/10/2016  CLINICAL DATA:  Acute left lower back pain. EXAM: CT ABDOMEN AND PELVIS WITH CONTRAST TECHNIQUE: Multidetector CT imaging of the abdomen and pelvis was performed using the standard protocol following bolus administration of intravenous contrast. CONTRAST:  OMNIPAQUE IOHEXOL 300 MG/ML  SOLN COMPARISON:  None. FINDINGS: Visualized lung bases are  unremarkable. No significant osseous abnormality is noted. No gallstones are noted. The liver, spleen and pancreas appear normal. Adrenal glands appear normal. No hydronephrosis or renal obstruction is noted. Simple exophytic cyst is seen arising from midpole of left kidney. Small cyst with peripheral calcification is noted in midpole of right kidney. No renal or ureteral calculi are noted. There is no evidence of bowel obstruction. The appendix appears normal. Mild diverticulosis is noted throughout the colon without inflammation. Urinary bladder is decompressed ovaries are unremarkable. 2.7 cm fibroid is noted in the uterus. No significant adenopathy is noted. Redundant sigmoid colon is noted. IMPRESSION: 2.7 cm uterine fibroid. Mild diverticulosis is noted throughout the colon without inflammation. No acute abnormality seen in the abdomen or pelvis. Electronically Signed   By: Lupita Raider, M.D.   On: 02/10/2016 17:37    I personally reviewed and evaluated these images and lab results as a part of my medical decision-making.  EKG Interpretation None        EMERGENCY DEPARTMENT US SOFT TISSUE INTERPRETATION "Study: Limited Ultrasound of the noted body part in comments below"  INDICATIONS: Pain Multiple views of the body part are obtained with a multi-frequency linear probe  PERFORMED BY:  Myself  IMAGES ARCHIVED?: Yes  SIDE:Left  BODY PART:Lower back  FINDINGS: Normal soft tissue ultrasound  LIMITATIONS: None  INTERPRETATION:  No abcess noted, No cellulitis noted and No abnormal findings noted  COMMENT:  The soft tissue ultrasound was used to assess the composition of the patient's soft mass in the lower left back. No area of fluid collection or evidence of a solid mass.   MDM   Final diagnoses:  Mass on back    Kimberley Speece presents with lower left back pain that began 3 days ago accompanied by a mass in the same area noticed today.  Findings and plan of care discussed with Derwood Kaplan, MD. Dr. Rhunette Croft examined and assessed this patient personally.  Suspect that the patient's pain is due to muscle strain. Suspect that the mass may be a lipoma. The mass will be assessed to determine its composition. The patient is chronically unable to reach around her back due to body habitus, thus this mass may not be acute. 3:58 PM Spoke with Dr. Mosetta Putt, Radiologist,  for advice on imaging selection for this patient. Recommends a CT with contrast for best imaging of the mass. Due to it's location, an abdominal CT was recommended.  4:35 PM Pt was reassessed and states that her pain has reduced some. Further pain management was ordered.  5:50 PM Spoke with Dr. Chilton Si, Radiologist, to discuss the read on the patient's CT scan. I wanted to make sure that it was clear to Dr. Chilton Si that we were not looking for something inside the abdomen, but rather a mass in the lower back. Dr. Chilton Si took a second look at the CT scan and states that he doesn't see anything abnormal that would explain the mass. Certainly not a fluid collection or solid mass.   This patient's pain improved greatly with conservative management here in the ED. Patient has no new symptoms. The patient was given instructions for home care as well as return precautions. Patient voices understanding of these instructions, accepts the plan, and is comfortable with discharge.  Filed Vitals:   02/10/16 1517 02/10/16 1930  BP: 134/94 121/85  Pulse: 77 78  Temp: 99 F (37.2 C)   TempSrc: Oral   Resp: 20 16  SpO2: 98% 100%  I personally performed the services described in this documentation, which was scribed in my presence. The recorded information has been reviewed and is accurate.    Anselm Pancoast, PA-C 02/11/16 0719  Derwood Kaplan, MD 02/12/16 [phone]

## 2016-03-24 ENCOUNTER — Other Ambulatory Visit: Payer: Self-pay | Admitting: Family Medicine

## 2016-04-24 ENCOUNTER — Ambulatory Visit (INDEPENDENT_AMBULATORY_CARE_PROVIDER_SITE_OTHER): Payer: Medicaid Other | Admitting: Family Medicine

## 2016-04-24 VITALS — BP 139/82 | HR 98 | Temp 97.7°F | Resp 18 | Ht 67.0 in | Wt 230.0 lb

## 2016-04-24 DIAGNOSIS — E7800 Pure hypercholesterolemia, unspecified: Secondary | ICD-10-CM | POA: Diagnosis not present

## 2016-04-24 DIAGNOSIS — E1310 Other specified diabetes mellitus with ketoacidosis without coma: Secondary | ICD-10-CM

## 2016-04-24 DIAGNOSIS — E138 Other specified diabetes mellitus with unspecified complications: Secondary | ICD-10-CM | POA: Diagnosis not present

## 2016-04-24 DIAGNOSIS — I10 Essential (primary) hypertension: Secondary | ICD-10-CM

## 2016-04-24 DIAGNOSIS — E1110 Type 2 diabetes mellitus with ketoacidosis without coma: Secondary | ICD-10-CM

## 2016-04-24 LAB — COMPLETE METABOLIC PANEL WITH GFR
ALBUMIN: 4.4 g/dL (ref 3.6–5.1)
ALT: 62 U/L — AB (ref 6–29)
AST: 49 U/L — ABNORMAL HIGH (ref 10–35)
Alkaline Phosphatase: 59 U/L (ref 33–130)
BILIRUBIN TOTAL: 0.6 mg/dL (ref 0.2–1.2)
BUN: 11 mg/dL (ref 7–25)
CALCIUM: 9.5 mg/dL (ref 8.6–10.4)
CO2: 29 mmol/L (ref 20–31)
CREATININE: 0.69 mg/dL (ref 0.50–1.05)
Chloride: 103 mmol/L (ref 98–110)
Glucose, Bld: 58 mg/dL — ABNORMAL LOW (ref 65–99)
Potassium: 3.3 mmol/L — ABNORMAL LOW (ref 3.5–5.3)
Sodium: 143 mmol/L (ref 135–146)
TOTAL PROTEIN: 8 g/dL (ref 6.1–8.1)

## 2016-04-24 LAB — HEMOGLOBIN A1C
Hgb A1c MFr Bld: 6.4 % — ABNORMAL HIGH (ref <5.7)
MEAN PLASMA GLUCOSE: 137 mg/dL

## 2016-04-24 MED ORDER — GLIPIZIDE 10 MG PO TABS: ORAL_TABLET | ORAL | Status: DC

## 2016-04-24 MED ORDER — POTASSIUM CHLORIDE CRYS ER 20 MEQ PO TBCR: EXTENDED_RELEASE_TABLET | ORAL | Status: DC

## 2016-04-24 MED ORDER — AMLODIPINE BESYLATE 10 MG PO TABS: 10.0000 mg | ORAL_TABLET | Freq: Every day | ORAL | Status: DC

## 2016-04-24 MED ORDER — LISINOPRIL 10 MG PO TABS: 10.0000 mg | ORAL_TABLET | Freq: Every day | ORAL | Status: DC

## 2016-04-24 MED ORDER — HYDROCHLOROTHIAZIDE 25 MG PO TABS: 25.0000 mg | ORAL_TABLET | Freq: Every morning | ORAL | Status: DC

## 2016-04-24 NOTE — Progress Notes (Signed)
Patient ID: Candace Diaz, female   DOB: [birth year], 60 y.o.   MRN: [id number]   Candace Diaz, is a 60 y.o. female  [id number]  [id number]  DOB - [birth year]  CC:  Chief Complaint  Patient presents with  . Follow-up  . Hypertension       HPI: Candace Diaz is a 60 y.o. female here for follow-up diabetes, hypercholesterolemia, and hypertension. Her last A1c in September 2016 was 7.4. She is currently on Lantus 20 units at HS, amlodipine 10, lisinopril 10, and hctz 25 for hypertension. She is on Pravachol 40. She has a history of hypokalemia and is on K+ 20 meq daily. She reports no significant change in symptoms or condition since last visit. She is hard of hearing since birth.She does not exercise regularly but does try to follow a healthly diet.  Health Maintenance: She has a diabetic eye exam scheduled for the end of the month. She has been screened for HIV and Hep C. Immunizations are UTD. PAP and mammogram are UTD. She denies tobacco, alcohol or illicit drug use.  Allergies  Allergen Reactions  . Aspirin Other (See Comments)    Reaction uknown  . Penicillins Other (See Comments)    Reaction unknown   Past Medical History  Diagnosis Date  . Hypertension   . Diabetes mellitus    Current Outpatient Prescriptions on File Prior to Visit  Medication Sig Dispense Refill  . ACCU-CHEK AVIVA PLUS test strip USE TO CHECK BLOOD SUGAR TWICE DAILY 60 each 4  . EASY TOUCH INSULIN SYRINGE 31G X 5/16" 0.5 ML MISC USE TO INJECT INSULIN EVERY DAY 30 each 3  . Lancets (ACCU-CHEK MULTICLIX) lancets USE TO CHECK BLOOD SUGAR TWICE DAILY 102 each 2  . Lancets (ACCU-CHEK MULTICLIX) lancets USE TO CHECK BLOOD SUGAR TWICE DAILY 102 each 2  . Lancets (ACCU-CHEK MULTICLIX) lancets USE TO CHECK BLOOD SUGAR TWICE DAILY 102 each 2  . LANTUS 100 UNIT/ML injection INJECT 20 UNITS SUBCUTANEOUSLY AT BEDTIME 10 mL 3  . lidocaine (LIDODERM) 5 % Place 1 patch onto the skin daily. Remove & Discard patch within 12  hours or as directed by MD 30 patch 0  . methocarbamol (ROBAXIN) 500 MG tablet Take 1 tablet (500 mg total) by mouth 2 (two) times daily. 20 tablet 0  . Multiple Vitamins-Minerals (MULTIVITAMIN WITH MINERALS) tablet Take 1 tablet by mouth daily.    Marland Kitchen. oxyCODONE-acetaminophen (PERCOCET/ROXICET) 5-325 MG tablet Take 1-2 tablets by mouth every 4 (four) hours as needed for severe pain. 6 tablet 0  . pravastatin (PRAVACHOL) 40 MG tablet Take 1 tablet (40 mg total) by mouth daily. 90 tablet 3  . PROVENTIL HFA 108 (90 BASE) MCG/ACT inhaler INHALE 2 PUFFS BY MOUTH FOUR TIMES A DAY AS NEEDED FOR SHORTNESS OF BREATH 6.7 g 3  . PROVENTIL HFA 108 (90 BASE) MCG/ACT inhaler INHALE 2 PUFFS BY MOUTH FOUR TIMES A DAY AS NEEDED FOR SHORTNESS OF BREATH 6.7 g 3  . PROVENTIL HFA 108 (90 Base) MCG/ACT inhaler INHALE 2 PUFFS BY MOUTH FOUR TIMES A DAY AS NEEDED FOR SHORTNESS OF BREATH 6.7 each 2  . PROVENTIL HFA 108 (90 Base) MCG/ACT inhaler INHALE 2 PUFFS BY MOUTH FOUR TIMES A DAY AS NEEDED FOR SHORTNESS OF BREATH 6.7 each 2  . traMADol (ULTRAM) 50 MG tablet Take 1 to 2 tablets twice a day as needed for pain 60 tablet 2   No current facility-administered medications on file prior to visit.   Family History  Problem Relation Age of Onset  . Diabetes Brother   . Hypertension Brother   . Diabetes Other   . Hypertension Other    Social History   Social History  . Marital Status: Single    Spouse Name: N/A  . Number of Children: N/A  . Years of Education: N/A   Occupational History  . Not on file.   Social History Main Topics  . Smoking status: Never Smoker   . Smokeless tobacco: Never Used  . Alcohol Use: No  . Drug Use: No  . Sexual Activity: Not on file   Other Topics Concern  . Not on file   Social History Narrative    Review of Systems: Constitutional: Negative for fever, chills, appetite change, weight loss,  Fatigue. Skin: Negative for rashes or lesions of concern. HENT: Negative for ear  pain, ear discharge.nose bleeds. Decreased hearing since birth Eyes: Negative for pain, discharge, redness, itching and visual disturbance. Neck: Negative for pain, stiffness Respiratory: Negative for cough, shortness of breath,   Cardiovascular: Negative for chest pain, palpitations and leg swelling. Gastrointestinal: Negative for abdominal pain, nausea, vomiting, diarrhea, constipations Genitourinary: Negative for dysuria, urgency, frequency, hematuria,  Musculoskeletal: Negative for back pain, joint pain, joint  swelling, and gait problem.Negative for weakness. Neurological: Negative for dizziness, tremors, seizures, syncope,   light-headedness, numbness and headaches.  Hematological: Negative for easy bruising or bleeding Psychiatric/Behavioral: Negative for depression, anxiety, decreased concentration, confusion   Objective:   Filed Vitals:   04/24/16 1022  BP: 139/82  Pulse: 98  Temp: 97.7 F (36.5 C)  Resp: 18    Physical Exam: Constitutional: Patient appears well-developed and well-nourished. No distress. HENT: Normocephalic, atraumatic, External right and left ear normal. Oropharynx is clear and moist.  Eyes: Conjunctivae and EOM are normal. PERRLA, no scleral icterus. Neck: Normal ROM. Neck supple. No lymphadenopathy, No thyromegaly. CVS: RRR, S1/S2 +, no murmurs, no gallops, no rubs Pulmonary: Effort and breath sounds normal, no stridor, rhonchi, wheezes, rales.  Abdominal: Soft. Normoactive BS,, no distension, tenderness, rebound or guarding.  Musculoskeletal: Normal range of motion. No edema and no tenderness.  Neuro: Alert.Normal muscle tone coordination. Non-focal Skin: Skin is warm and dry. No rash noted. Not diaphoretic. No erythema. No pallor. Psychiatric: Normal mood and affect. Behavior, judgment, thought content normal.  Lab Results  Component Value Date   WBC 5.7 02/10/2016   HGB 13.9 02/10/2016   HCT 41.0 02/10/2016   MCV 90.5 02/10/2016   PLT 381  02/10/2016   Lab Results  Component Value Date   CREATININE 0.80 02/10/2016   BUN 14 02/10/2016   NA 142 02/10/2016   K 4.0 02/10/2016   CL 102 02/10/2016   CO2 28 01/24/2016    Lab Results  Component Value Date   HGBA1C 7.4* 09/09/2015   Lipid Panel     Component Value Date/Time   CHOL 135 01/24/2016 0837   TRIG 81 01/24/2016 0837   HDL 53 01/24/2016 0837   CHOLHDL 2.5 01/24/2016 0837   VLDL 16 01/24/2016 0837   LDLCALC 66 01/24/2016 0837       Assessment and plan:   1. Diabetes mellitus of other type with complication (HCC)  - COMPLETE METABOLIC PANEL WITH GFR - Hemoglobin A1c  2. HTN (hypertension), malignant -Continue current medictions   4. Elevated cholesterol -continue current medications.    Return in about 3 months (around 07/25/2016).  The patient was given clear instructions to go to ER or return to medical center  if symptoms don't improve, worsen or new problems develop. The patient verbalized understanding.    Henrietta Hoover FNP  04/24/2016, 11:19 AM

## 2016-04-24 NOTE — Patient Instructions (Signed)
Continue to watch carbohydrates (sweets, potatoes, rice, pasta, etc) in your diet All careful of fats Try to eat mostly vegetables, fruits and lean meats that are baked, broiled, boiled, or grilled; not fried. Try to walk some daily.

## 2016-04-25 ENCOUNTER — Other Ambulatory Visit: Payer: Self-pay | Admitting: Internal Medicine

## 2016-04-25 ENCOUNTER — Telehealth: Payer: Self-pay

## 2016-04-25 NOTE — Telephone Encounter (Signed)
-----   Message from Henrietta HooverLinda C Bernhardt, NP sent at 04/25/2016  7:59 AM EDT ----- A1C excellent at 6.4. Potassium is a little low. Are you taking your potassium supplement regularly? If not, it is very important to do so. Let me know if she has been taking regularly so I can adjust dosage.

## 2016-04-25 NOTE — Telephone Encounter (Signed)
Called no answer, and no way to leave a message. Will try later. Thanks!

## 2016-04-26 ENCOUNTER — Other Ambulatory Visit: Payer: Self-pay | Admitting: Family Medicine

## 2016-04-26 NOTE — Telephone Encounter (Signed)
Candace Diaz,  Patient states she has been taking potassium everyday. Please advise if you have any medication changes. Thanks!

## 2016-04-26 NOTE — Telephone Encounter (Signed)
Ask her continue same dose of potassium but to eat a banana a day.

## 2016-05-26 ENCOUNTER — Other Ambulatory Visit: Payer: Medicaid Other

## 2016-05-26 ENCOUNTER — Other Ambulatory Visit: Payer: Self-pay | Admitting: Family Medicine

## 2016-05-26 DIAGNOSIS — E876 Hypokalemia: Secondary | ICD-10-CM

## 2016-05-26 DIAGNOSIS — E559 Vitamin D deficiency, unspecified: Secondary | ICD-10-CM

## 2016-05-26 LAB — COMPLETE METABOLIC PANEL WITH GFR
ALBUMIN: 4.3 g/dL (ref 3.6–5.1)
ALK PHOS: 58 U/L (ref 33–130)
ALT: 64 U/L — ABNORMAL HIGH (ref 6–29)
AST: 48 U/L — AB (ref 10–35)
BILIRUBIN TOTAL: 0.7 mg/dL (ref 0.2–1.2)
BUN: 13 mg/dL (ref 7–25)
CALCIUM: 9.3 mg/dL (ref 8.6–10.4)
CO2: 29 mmol/L (ref 20–31)
Chloride: 102 mmol/L (ref 98–110)
Creat: 0.83 mg/dL (ref 0.50–1.05)
GFR, Est African American: >89 mL/min (ref >=60)
GFR, Est Non African American: 78 mL/min (ref >=60)
Glucose, Bld: 72 mg/dL (ref 65–99)
POTASSIUM: 3.3 mmol/L — AB (ref 3.5–5.3)
Sodium: 142 mmol/L (ref 135–146)
TOTAL PROTEIN: 7.8 g/dL (ref 6.1–8.1)

## 2016-05-28 LAB — VITAMIN D 1,25 DIHYDROXY
VITAMIN D 1, 25 (OH) TOTAL: 37 pg/mL (ref 18–72)
Vitamin D2 1, 25 (OH)2: <8 pg/mL
Vitamin D3 1, 25 (OH)2: 37 pg/mL

## 2016-05-30 ENCOUNTER — Other Ambulatory Visit: Payer: Self-pay | Admitting: Family Medicine

## 2016-07-25 ENCOUNTER — Other Ambulatory Visit: Payer: Self-pay | Admitting: Family Medicine

## 2016-07-28 ENCOUNTER — Ambulatory Visit (INDEPENDENT_AMBULATORY_CARE_PROVIDER_SITE_OTHER): Payer: Medicaid Other | Admitting: Family Medicine

## 2016-07-28 ENCOUNTER — Encounter: Payer: Self-pay | Admitting: Family Medicine

## 2016-07-28 VITALS — BP 140/88 | HR 92 | Temp 98.2°F | Resp 16 | Ht 67.0 in | Wt 231.0 lb

## 2016-07-28 DIAGNOSIS — E138 Other specified diabetes mellitus with unspecified complications: Secondary | ICD-10-CM

## 2016-07-28 LAB — POCT GLYCOSYLATED HEMOGLOBIN (HGB A1C): Hemoglobin A1C: 6.3

## 2016-07-28 NOTE — Progress Notes (Signed)
Candace Diaz, is a 60 y.o. female  ZOX:[id number]  [id number]:[id number]  DOB - 1956-10-18  CC:  Chief Complaint  Patient presents with  . Hypertension  . Diabetes       HPI: Candace Diaz is a 60 y.o. female here for follow-up diabetes and hypertension. Her last A1C 3 months ago was 6.3. She reports using her medications as prescribed. She denies any change in her condition since her last visit. She tries to follow a heatthy diet. She does not exercise regularly.   Allergies  Allergen Reactions  . Aspirin Other (See Comments)    Reaction uknown  . Penicillins Other (See Comments)    Reaction unknown   Past Medical History:  Diagnosis Date  . Diabetes mellitus   . Hypertension    Current Outpatient Prescriptions on File Prior to Visit  Medication Sig Dispense Refill  . ACCU-CHEK AVIVA PLUS test strip USE TO CHECK BLOOD SUGAR TWICE DAILY 60 each 4  . amLODipine (NORVASC) 10 MG tablet Take 1 tablet (10 mg total) by mouth daily. 30 tablet 3  . EASY TOUCH INSULIN SYRINGE 31G X 5/16" 0.5 ML MISC USE TO INJECT INSULIN EVERY DAY 100 each 2  . glipiZIDE (GLUCOTROL) 10 MG tablet TAKE 1 TABLET BY MOUTH 2 TIMES A DAY BEFORE MEAL 60 tablet 1  . hydrochlorothiazide (HYDRODIURIL) 25 MG tablet Take 1 tablet (25 mg total) by mouth every morning. 90 tablet 3  . Lancets (ACCU-CHEK MULTICLIX) lancets USE TO CHECK BLOOD SUGAR TWICE DAILY 102 each 2  . LANTUS 100 UNIT/ML injection INJECT 20 UNITS SUBCUTANEOUSLY EVERY NIGHT AT BEDTIME 10 mL 2  . potassium chloride SA (K-DUR,KLOR-CON) 20 MEQ tablet TAKE 1 TABLET BY MOUTH ONCE DAILY WITH FOOD 30 tablet 5  . pravastatin (PRAVACHOL) 40 MG tablet Take 1 tablet (40 mg total) by mouth daily. 90 tablet 3  . PROVENTIL HFA 108 (90 BASE) MCG/ACT inhaler INHALE 2 PUFFS BY MOUTH FOUR TIMES A DAY AS NEEDED FOR SHORTNESS OF BREATH 6.7 g 3  . lidocaine (LIDODERM) 5 % Place 1 patch onto the skin daily. Remove & Discard patch within 12 hours or as directed by MD (Patient  not taking: Reported on 07/28/2016) 30 patch 0  . lisinopril (PRINIVIL,ZESTRIL) 10 MG tablet Take 1 tablet (10 mg total) by mouth daily. (Patient not taking: Reported on 07/28/2016) 90 tablet 3  . methocarbamol (ROBAXIN) 500 MG tablet Take 1 tablet (500 mg total) by mouth 2 (two) times daily. (Patient not taking: Reported on 07/28/2016) 20 tablet 0  . Multiple Vitamins-Minerals (MULTIVITAMIN WITH MINERALS) tablet Take 1 tablet by mouth daily.    Marland Kitchen oxyCODONE-acetaminophen (PERCOCET/ROXICET) 5-325 MG tablet Take 1-2 tablets by mouth every 4 (four) hours as needed for severe pain. (Patient not taking: Reported on 07/28/2016) 6 tablet 0  . traMADol (ULTRAM) 50 MG tablet Take 1 to 2 tablets twice a day as needed for pain (Patient not taking: Reported on 07/28/2016) 60 tablet 2   No current facility-administered medications on file prior to visit.    Family History  Problem Relation Age of Onset  . Diabetes Brother   . Hypertension Brother   . Diabetes Other   . Hypertension Other    Social History   Social History  . Marital status: Single    Spouse name: N/A  . Number of children: N/A  . Years of education: N/A   Occupational History  . Not on file.   Social History Main Topics  . Smoking  status: Never Smoker  . Smokeless tobacco: Never Used  . Alcohol use No  . Drug use: No  . Sexual activity: Not on file   Other Topics Concern  . Not on file   Social History Narrative  . No narrative on file    Review of Systems: Constitutional: Negative for fever, chills, appetite change, weight loss,  Fatigue. Skin: Negative for rashes or lesions of concern. HENT: Negative for ear pain, ear discharge.nose bleeds Eyes: Negative for pain, discharge, redness, itching and visual disturbance. Neck: Negative for pain, stiffness Respiratory: Negative for cough, shortness of breath,   Cardiovascular: Negative for chest pain, palpitations and leg swelling. Gastrointestinal: Negative for abdominal pain,  nausea, vomiting, diarrhea, constipations Genitourinary: Negative for dysuria, urgency, frequency, hematuria,  Musculoskeletal: Negative for back pain, joint pain, joint  swelling, and gait problem.Negative for weakness. Neurological: Negative for dizziness, tremors, seizures, syncope,   light-headedness, numbness and headaches.  Hematological: Negative for easy bruising or bleeding Psychiatric/Behavioral: Negative for depression, anxiety, decreased concentration, confusion   Objective:   Vitals:   07/28/16 0952  BP: 140/88  Pulse: 92  Resp: 16  Temp: 98.2 F (36.8 C)    Physical Exam: Constitutional: Patient appears well-developed and well-nourished. No distress. HENT: Normocephalic, atraumatic, External right and left ear normal. Oropharynx is clear and moist.  Eyes: Conjunctivae and EOM are normal. PERRLA, no scleral icterus. Neck: Normal ROM. Neck supple. No lymphadenopathy, No thyromegaly. CVS: RRR, S1/S2 +, no murmurs, no gallops, no rubs Pulmonary: Effort and breath sounds normal, no stridor, rhonchi, wheezes, rales.  Abdominal: Soft. Normoactive BS,, no distension, tenderness, rebound or guarding.  Musculoskeletal: Normal range of motion. No edema and no tenderness.  Neuro: Alert.Normal muscle tone coordination. Non-focal Skin: Skin is warm and dry. No rash noted. Not diaphoretic. No erythema. No pallor. Psychiatric: Normal mood and affect. Behavior, judgment, thought content normal.  Lab Results  Component Value Date   WBC 5.7 02/10/2016   HGB 13.9 02/10/2016   HCT 41.0 02/10/2016   MCV 90.5 02/10/2016   PLT 381 02/10/2016   Lab Results  Component Value Date   CREATININE 0.83 05/26/2016   BUN 13 05/26/2016   NA 142 05/26/2016   K 3.3 (L) 05/26/2016   CL 102 05/26/2016   CO2 29 05/26/2016    Lab Results  Component Value Date   HGBA1C 6.3 07/28/2016   Lipid Panel     Component Value Date/Time   CHOL 135 01/24/2016 0837   TRIG 81 01/24/2016 0837   HDL  53 01/24/2016 0837   CHOLHDL 2.5 01/24/2016 0837   VLDL 16 01/24/2016 0837   LDLCALC 66 01/24/2016 0837       Assessment and plan:   1. Diabetes mellitus of other type with complication (HCC)  - Microalbumin, urine - POCT glycosylated hemoglobin (Hb A1C) -continue current regime unless notified otherwise.   Follow-up 6 months.  The patient was given clear instructions to go to ER or return to medical center if symptoms don't improve, worsen or new problems develop. The patient verbalized understanding.    Henrietta Hoover FNP  07/28/2016, 10:21 AM

## 2016-07-29 LAB — MICROALBUMIN, URINE: MICROALB UR: 2.1 mg/dL

## 2016-08-22 ENCOUNTER — Other Ambulatory Visit: Payer: Self-pay | Admitting: Family Medicine

## 2016-08-22 DIAGNOSIS — E1165 Type 2 diabetes mellitus with hyperglycemia: Secondary | ICD-10-CM

## 2016-08-22 DIAGNOSIS — IMO0002 Reserved for concepts with insufficient information to code with codable children: Secondary | ICD-10-CM

## 2016-08-22 DIAGNOSIS — E7800 Pure hypercholesterolemia, unspecified: Secondary | ICD-10-CM

## 2016-09-19 ENCOUNTER — Other Ambulatory Visit: Payer: Self-pay | Admitting: Family Medicine

## 2016-10-06 ENCOUNTER — Ambulatory Visit: Payer: Medicaid Other | Admitting: Family Medicine

## 2016-10-17 ENCOUNTER — Other Ambulatory Visit: Payer: Self-pay | Admitting: Family Medicine

## 2016-10-30 ENCOUNTER — Ambulatory Visit (INDEPENDENT_AMBULATORY_CARE_PROVIDER_SITE_OTHER): Payer: Medicaid Other | Admitting: Family Medicine

## 2016-10-30 ENCOUNTER — Encounter: Payer: Self-pay | Admitting: Family Medicine

## 2016-10-30 VITALS — BP 118/78 | HR 77 | Temp 97.8°F | Resp 14 | Ht 67.0 in | Wt 227.0 lb

## 2016-10-30 DIAGNOSIS — Z23 Encounter for immunization: Secondary | ICD-10-CM | POA: Diagnosis not present

## 2016-10-30 DIAGNOSIS — E119 Type 2 diabetes mellitus without complications: Secondary | ICD-10-CM | POA: Diagnosis not present

## 2016-10-30 LAB — COMPLETE METABOLIC PANEL WITH GFR
ALT: 48 U/L — AB (ref 6–29)
AST: 47 U/L — ABNORMAL HIGH (ref 10–35)
Albumin: 4.2 g/dL (ref 3.6–5.1)
Alkaline Phosphatase: 54 U/L (ref 33–130)
BILIRUBIN TOTAL: 0.5 mg/dL (ref 0.2–1.2)
BUN: 12 mg/dL (ref 7–25)
CALCIUM: 9.4 mg/dL (ref 8.6–10.4)
CO2: 22 mmol/L (ref 20–31)
CREATININE: 0.74 mg/dL (ref 0.50–1.05)
Chloride: 103 mmol/L (ref 98–110)
GFR, EST NON AFRICAN AMERICAN: 89 mL/min (ref >=60)
Glucose, Bld: 80 mg/dL (ref 65–99)
Potassium: 3.7 mmol/L (ref 3.5–5.3)
Sodium: 140 mmol/L (ref 135–146)
TOTAL PROTEIN: 7.5 g/dL (ref 6.1–8.1)

## 2016-10-30 MED ORDER — AMLODIPINE BESYLATE 10 MG PO TABS: 10.0000 mg | ORAL_TABLET | Freq: Every day | ORAL | 3 refills | Status: DC

## 2016-10-30 MED ORDER — INSULIN GLARGINE 100 UNIT/ML ~~LOC~~ SOLN: SUBCUTANEOUS | 1 refills | Status: DC

## 2016-10-30 MED ORDER — POTASSIUM CHLORIDE CRYS ER 20 MEQ PO TBCR: EXTENDED_RELEASE_TABLET | ORAL | 5 refills | Status: DC

## 2016-10-30 NOTE — Progress Notes (Signed)
Candace Diaz, is a 60 y.o. female  ZOX:[id number]  [id number]  DOB - [birth year]  CC:  Chief Complaint  Patient presents with  . Follow-up  . Diabetes       HPI: Candace ChanceZola Mckinney is a 60 y.o. female here for follow-up HTN, diabetes, arthritis, She has a history of CVA and Hx. Of palpitations. She is on amlodipine, hctz, potassium, Lantus, glipizide. She is also on proair and proventil for asthma. She reports doing well, with no significant change since her last visit. Her last A1C was 6.3.   Health Maintenance: Needs flu shot today. Needs dilated eye exam, needs colonoscopy.    Allergies  Allergen Reactions  . Aspirin Other (See Comments)    Reaction uknown  . Penicillins Other (See Comments)    Reaction unknown   Past Medical History:  Diagnosis Date  . Diabetes mellitus   . Hypertension    Current Outpatient Prescriptions on File Prior to Visit  Medication Sig Dispense Refill  . ACCU-CHEK AVIVA PLUS test strip USE TO CHECK BLOOD SUGAR TWICE DAILY 50 each 3  . EASY TOUCH INSULIN SYRINGE 31G X 5/16" 0.5 ML MISC USE TO INJECT INSULIN EVERY DAY 100 each 1  . glipiZIDE (GLUCOTROL) 10 MG tablet TAKE 1 TABLET BY MOUTH 2 TIMES A DAY BEFORE MEAL 60 tablet 2  . hydrochlorothiazide (HYDRODIURIL) 25 MG tablet Take 1 tablet (25 mg total) by mouth every morning. 90 tablet 3  . Lancets (ACCU-CHEK MULTICLIX) lancets USE TO CHECK BLOOD SUGAR TWICE DAILY 102 each 2  . lisinopril (PRINIVIL,ZESTRIL) 10 MG tablet Take 1 tablet (10 mg total) by mouth daily. 90 tablet 3  . Multiple Vitamins-Minerals (MULTIVITAMIN WITH MINERALS) tablet Take 1 tablet by mouth daily.    . pravastatin (PRAVACHOL) 40 MG tablet TAKE 1 TABLET BY MOUTH DAILY 90 tablet 2  . PROAIR HFA 108 (90 Base) MCG/ACT inhaler INHALE 2 PUFFS BY MOUTH FOUR TIMES A DAY AS NEEDED FOR SHORTNESS OF BREATH. 8.5 each 2  . lidocaine (LIDODERM) 5 % Place 1 patch onto the skin daily. Remove & Discard patch within 12 hours or as directed by MD  (Patient not taking: Reported on 10/30/2016) 30 patch 0  . methocarbamol (ROBAXIN) 500 MG tablet Take 1 tablet (500 mg total) by mouth 2 (two) times daily. (Patient not taking: Reported on 10/30/2016) 20 tablet 0  . oxyCODONE-acetaminophen (PERCOCET/ROXICET) 5-325 MG tablet Take 1-2 tablets by mouth every 4 (four) hours as needed for severe pain. (Patient not taking: Reported on 10/30/2016) 6 tablet 0  . PROVENTIL HFA 108 (90 BASE) MCG/ACT inhaler INHALE 2 PUFFS BY MOUTH FOUR TIMES A DAY AS NEEDED FOR SHORTNESS OF BREATH (Patient not taking: Reported on 10/30/2016) 6.7 g 3  . traMADol (ULTRAM) 50 MG tablet Take 1 to 2 tablets twice a day as needed for pain (Patient not taking: Reported on 10/30/2016) 60 tablet 2   No current facility-administered medications on file prior to visit.    Family History  Problem Relation Age of Onset  . Diabetes Brother   . Hypertension Brother   . Diabetes Other   . Hypertension Other    Social History   Social History  . Marital status: Single    Spouse name: N/A  . Number of children: N/A  . Years of education: N/A   Occupational History  . Not on file.   Social History Main Topics  . Smoking status: Never Smoker  . Smokeless tobacco: Never Used  . Alcohol  use No  . Drug use: No  . Sexual activity: Not on file   Other Topics Concern  . Not on file   Social History Narrative  . No narrative on file    Review of Systems: Constitutional: Negative Skin: Negative HENT: Negative. + for decreased hearing and intermittent nasal congestion.  Eyes: Negative  Neck: Negative Respiratory: Negative Cardiovascular: Negative Gastrointestinal: Negative Genitourinary: Negative  Musculoskeletal: + for low back pain Neurological: Negative  Hematological: Negative  Psychiatric/Behavioral: Negative    Objective:   Vitals:   10/30/16 1024  BP: 118/78  Pulse: 77  Resp: 14  Temp: 97.8 F (36.6 C)    Physical Exam: Constitutional: Patient appears  well-developed and well-nourished. No distress. HENT: Normocephalic, atraumatic, External right and left ear normal. Oropharynx is clear and moist.  Eyes: Conjunctivae and EOM are normal. PERRLA, no scleral icterus. Neck: Normal ROM. Neck supple. No lymphadenopathy, No thyromegaly. CVS: RRR, S1/S2 +, no murmurs, no gallops, no rubs Pulmonary: Effort and breath sounds normal, no stridor, rhonchi, wheezes, rales.  Abdominal: Soft. Normoactive BS,, no distension, tenderness, rebound or guarding.  Musculoskeletal: Normal range of motion. No edema and no tenderness.  Neuro: Alert.Normal muscle tone coordination. Non-focal Skin: Skin is warm and dry. No rash noted. Not diaphoretic. No erythema. No pallor. Psychiatric: Normal mood and affect. Behavior, judgment, thought content normal.  Lab Results  Component Value Date   WBC 5.7 02/10/2016   HGB 13.9 02/10/2016   HCT 41.0 02/10/2016   MCV 90.5 02/10/2016   PLT 381 02/10/2016   Lab Results  Component Value Date   CREATININE 0.83 05/26/2016   BUN 13 05/26/2016   NA 142 05/26/2016   K 3.3 (L) 05/26/2016   CL 102 05/26/2016   CO2 29 05/26/2016    Lab Results  Component Value Date   HGBA1C 6.3 07/28/2016   Lipid Panel     Component Value Date/Time   CHOL 135 01/24/2016 0837   TRIG 81 01/24/2016 0837   HDL 53 01/24/2016 0837   CHOLHDL 2.5 01/24/2016 0837   VLDL 16 01/24/2016 0837   LDLCALC 66 01/24/2016 0837        Assessment and plan:   1. Needs flu shot  - Flu Vaccine QUAD 36+ mos PF IM (Fluarix & Fluzone Quad PF)  2. Type 2 diabetes mellitus without complication, unspecified long term insulin use status (HCC)  - CBC with Differential - COMPLETE METABOLIC PANEL WITH GFR   Return in about 6 months (around 04/29/2017) for Diabetes.  The patient was given clear instructions to go to ER or return to medical center if symptoms don't improve, worsen or new problems develop. The patient verbalized understanding.    Henrietta HooverLinda C  Aubriel Khanna FNP  10/30/2016, 3:00 PM

## 2016-10-30 NOTE — Patient Instructions (Signed)
[facility] Continue to watch sugars and other carbs.  Let me know what medication for your arthritis you need.

## 2016-10-31 LAB — CBC WITH DIFFERENTIAL/PLATELET
BASOS ABS: 48 {cells}/uL (ref 0–200)
BASOS PCT: 1 %
EOS ABS: 192 {cells}/uL (ref 15–500)
Eosinophils Relative: 4 %
HEMATOCRIT: 34.7 % — AB (ref 35.0–45.0)
Hemoglobin: 11.3 g/dL — ABNORMAL LOW (ref 11.7–15.5)
LYMPHS PCT: 43 %
Lymphs Abs: 2064 cells/uL (ref 850–3900)
MCH: 28.9 pg (ref 27.0–33.0)
MCHC: 32.6 g/dL (ref 32.0–36.0)
MCV: 88.7 fL (ref 80.0–100.0)
MONO ABS: 240 {cells}/uL (ref 200–950)
MONOS PCT: 5 %
MPV: 10.3 fL (ref 7.5–12.5)
Neutro Abs: 2256 cells/uL (ref 1500–7800)
Neutrophils Relative %: 47 %
Platelets: 308 10*3/uL (ref 140–400)
RBC: 3.91 MIL/uL (ref 3.80–5.10)
RDW: 13.7 % (ref 11.0–15.0)
WBC: 4.8 10*3/uL (ref 3.8–10.8)

## 2016-11-17 ENCOUNTER — Other Ambulatory Visit: Payer: Self-pay | Admitting: Family Medicine

## 2016-12-15 ENCOUNTER — Other Ambulatory Visit: Payer: Self-pay | Admitting: Family Medicine

## 2017-01-15 ENCOUNTER — Other Ambulatory Visit: Payer: Self-pay | Admitting: Family Medicine

## 2017-01-20 ENCOUNTER — Emergency Department (HOSPITAL_COMMUNITY)
Admission: EM | Admit: 2017-01-20 | Discharge: 2017-01-20 | Disposition: A | Discharge status: Home / Self Care | Payer: Medicaid Other | Attending: Emergency Medicine | Admitting: Emergency Medicine

## 2017-01-20 ENCOUNTER — Encounter (HOSPITAL_COMMUNITY): Payer: Self-pay | Admitting: Emergency Medicine

## 2017-01-20 DIAGNOSIS — I10 Essential (primary) hypertension: Secondary | ICD-10-CM | POA: Insufficient documentation

## 2017-01-20 DIAGNOSIS — Z79899 Other long term (current) drug therapy: Secondary | ICD-10-CM | POA: Diagnosis not present

## 2017-01-20 DIAGNOSIS — E119 Type 2 diabetes mellitus without complications: Secondary | ICD-10-CM | POA: Insufficient documentation

## 2017-01-20 DIAGNOSIS — Z794 Long term (current) use of insulin: Secondary | ICD-10-CM | POA: Insufficient documentation

## 2017-01-20 DIAGNOSIS — E876 Hypokalemia: Secondary | ICD-10-CM | POA: Diagnosis not present

## 2017-01-20 LAB — CBC WITH DIFFERENTIAL/PLATELET
Basophils Absolute: 0 10*3/uL (ref 0.0–0.1)
Basophils Relative: 0 %
EOS ABS: 0.1 10*3/uL (ref 0.0–0.7)
EOS PCT: 2 %
HCT: 36.9 % (ref 36.0–46.0)
Hemoglobin: 12.2 g/dL (ref 12.0–15.0)
LYMPHS ABS: 3.1 10*3/uL (ref 0.7–4.0)
LYMPHS PCT: 45 %
MCH: 29.1 pg (ref 26.0–34.0)
MCHC: 33.1 g/dL (ref 30.0–36.0)
MCV: 88.1 fL (ref 78.0–100.0)
MONO ABS: 0.4 10*3/uL (ref 0.1–1.0)
MONOS PCT: 6 %
Neutro Abs: 3.2 10*3/uL (ref 1.7–7.7)
Neutrophils Relative %: 47 %
Platelets: 347 10*3/uL (ref 150–400)
RBC: 4.19 MIL/uL (ref 3.87–5.11)
RDW: 13.3 % (ref 11.5–15.5)
WBC: 6.8 10*3/uL (ref 4.0–10.5)

## 2017-01-20 LAB — COMPREHENSIVE METABOLIC PANEL
ALT: 69 U/L — AB (ref 14–54)
ANION GAP: 9 (ref 5–15)
AST: 57 U/L — ABNORMAL HIGH (ref 15–41)
Albumin: 4.5 g/dL (ref 3.5–5.0)
Alkaline Phosphatase: 63 U/L (ref 38–126)
BUN: 9 mg/dL (ref 6–20)
CHLORIDE: 103 mmol/L (ref 101–111)
CO2: 29 mmol/L (ref 22–32)
CREATININE: 0.76 mg/dL (ref 0.44–1.00)
Calcium: 9.4 mg/dL (ref 8.9–10.3)
Glucose, Bld: 99 mg/dL (ref 65–99)
Potassium: 3.2 mmol/L — ABNORMAL LOW (ref 3.5–5.1)
SODIUM: 141 mmol/L (ref 135–145)
Total Bilirubin: 0.6 mg/dL (ref 0.3–1.2)
Total Protein: 8.6 g/dL — ABNORMAL HIGH (ref 6.5–8.1)

## 2017-01-20 LAB — CBG MONITORING, ED: Glucose-Capillary: 69 mg/dL (ref 65–99)

## 2017-01-20 MED ORDER — POTASSIUM CHLORIDE CRYS ER 20 MEQ PO TBCR
40.0000 meq | EXTENDED_RELEASE_TABLET | Freq: Once | ORAL | Status: AC
  Administered 2017-01-20: 40 meq via ORAL
  Filled 2017-01-20: qty 2

## 2017-01-20 NOTE — ED Provider Notes (Signed)
[facility]-EMERGENCY DEPT Provider Note   CSN: [id number] Arrival date & time: 01/20/17  1643     History   Chief Complaint Chief Complaint  Patient presents with  . Hypertension  . Headache    HPI Candace Diaz is a 61 y.o. female.  HPI Patient states that she's been taking her blood pressure medication as prescribed. She woke up this morning and had generalized headache, paresthesias in her bilateral hands. Checked her blood pressure at home and it was significantly elevated. States she took her medication and her symptoms have since completely resolved. She denies any headache, weakness, numbness, visual changes at this time. Denied chest pain or shortness of breath at any point. Recent changes to her blood pressure medications. States she takes both of them in the morning. Past Medical History:  Diagnosis Date  . Diabetes mellitus   . Hypertension     Patient Active Problem List   Diagnosis Date Noted  . HTN (hypertension), malignant 12/03/2014  . DOE (dyspnea on exertion) 12/03/2014  . Acute serous otitis media 08/20/2014  . Essential hypertension, malignant 08/20/2014  . Other and unspecified hyperlipidemia 08/20/2014  . Diabetes mellitus type 2, uncontrolled (HCC) 08/20/2014  . Immunization due 08/20/2014  . Need for Tdap vaccination 08/20/2014  . History of palpitations 08/20/2014  . History of CVA with residual deficit 08/20/2014  . Arthritis of right knee 08/20/2014    History reviewed. No pertinent surgical history.  OB History    No data available       Home Medications    Prior to Admission medications   Medication Sig Start Date End Date Taking? Authorizing Provider  amLODipine (NORVASC) 10 MG tablet Take 1 tablet (10 mg total) by mouth daily. 10/30/16  Yes Henrietta HooverLinda C Bernhardt, NP  glipiZIDE (GLUCOTROL) 10 MG tablet TAKE 1 TABLET BY MOUTH 2 TIMES A DAY BEFORE A MEAL 01/16/17  Yes Henrietta HooverLinda C Bernhardt, NP  hydrochlorothiazide (HYDRODIURIL) 25 MG tablet Take 1  tablet (25 mg total) by mouth every morning. 04/24/16  Yes Henrietta HooverLinda C Bernhardt, NP  insulin glargine (LANTUS) 100 UNIT/ML injection INJECT 20 UNITS SUBCUTANEOUSLY EVERY NIGHT AT BEDTIME. 10/30/16  Yes Henrietta HooverLinda C Bernhardt, NP  lisinopril (PRINIVIL,ZESTRIL) 10 MG tablet Take 1 tablet (10 mg total) by mouth daily. 04/24/16  Yes Henrietta HooverLinda C Bernhardt, NP  Multiple Vitamins-Minerals (MULTIVITAMIN WITH MINERALS) tablet Take 1 tablet by mouth daily.   Yes Historical Provider, MD  potassium chloride SA (K-DUR,KLOR-CON) 20 MEQ tablet TAKE 1 TABLET BY MOUTH ONCE DAILY WITH FOOD 10/30/16  Yes Henrietta HooverLinda C Bernhardt, NP  pravastatin (PRAVACHOL) 40 MG tablet TAKE 1 TABLET BY MOUTH DAILY 08/23/16  Yes Henrietta HooverLinda C Bernhardt, NP  PROAIR HFA 108 [phone](90 Base) MCG/ACT inhaler INHALE 2 PUFFS BY MOUTH FOUR TIMES A DAY AS NEEDED FOR SHORTNESS OF BREATH. 12/20/16  Yes Henrietta HooverLinda C Bernhardt, NP  ACCU-CHEK AVIVA PLUS test strip USE TO CHECK BLOOD SUGAR TWICE DAILY 09/20/16   Henrietta HooverLinda C Bernhardt, NP  [facility]Y [facility] 31G X 5/16" 0.5 ML MISC USE TO INJECT INSULIN EVERY DAY 10/18/16   Henrietta HooverLinda C Bernhardt, NP  Lancets (ACCU-CHEK MULTICLIX) lancets USE TO CHECK BLOOD SUGAR TWICE DAILY 12/24/15   Henrietta HooverLinda C Bernhardt, NP  Lancets (ACCU-CHEK MULTICLIX) lancets USE TO CHECK BLOOD SUGAR TWICE DAILY 11/20/16   Henrietta HooverLinda C Bernhardt, NP  lidocaine (LIDODERM) 5 % Place 1 patch onto the skin daily. Remove & Discard patch within 12 hours or as directed by MD Patient not taking: Reported on  10/30/2016 02/10/16   Shawn C Joy, PA-C  methocarbamol (ROBAXIN) 500 MG tablet Take 1 tablet (500 mg total) by mouth 2 (two) times daily. Patient not taking: Reported on 10/30/2016 02/10/16   Anselm Pancoast, PA-C  oxyCODONE-acetaminophen (PERCOCET/ROXICET) 5-325 MG tablet Take 1-2 tablets by mouth every 4 (four) hours as needed for severe pain. Patient not taking: Reported on 10/30/2016 02/10/16   Shawn C Joy, PA-C  PROVENTIL HFA 108 (90 BASE) MCG/ACT inhaler INHALE 2 PUFFS BY MOUTH FOUR TIMES  A DAY AS NEEDED FOR SHORTNESS OF BREATH Patient not taking: Reported on 10/30/2016 12/06/15   Henrietta Hoover, NP  traMADol Janean Sark) 50 MG tablet Take 1 to 2 tablets twice a day as needed for pain Patient not taking: Reported on 10/30/2016 11/01/15   Ralene Cork, DO    Family History Family History  Problem Relation Age of Onset  . Diabetes Brother   . Hypertension Brother   . Diabetes Other   . Hypertension Other     Social History Social History  Substance Use Topics  . Smoking status: Never Smoker  . Smokeless tobacco: Never Used  . Alcohol use No     Allergies   Aspirin and Penicillins   Review of Systems Review of Systems  Constitutional: Negative for chills and fever.  Eyes: Negative for photophobia and visual disturbance.  Respiratory: Negative for cough and shortness of breath.   Cardiovascular: Negative for chest pain, palpitations and leg swelling.  Gastrointestinal: Negative for abdominal pain, nausea and vomiting.  Genitourinary: Negative for difficulty urinating, dysuria, flank pain and frequency.  Musculoskeletal: Negative for back pain, myalgias and neck pain.  Skin: Negative for pallor and rash.  Neurological: Positive for numbness and headaches. Negative for dizziness, seizures, syncope, weakness and light-headedness.  All other systems reviewed and are negative.    Physical Exam Updated Vital Signs BP 129/84 (BP Location: Left Arm)   Pulse 67   Temp 98 F (36.7 C) (Oral)   Resp 14   Ht 5\' 8"  (1.727 m)   Wt 225 lb (102.1 kg)   SpO2 99%   BMI 34.21 kg/m   Physical Exam  Constitutional: She is oriented to person, place, and time. She appears well-developed and well-nourished.  HENT:  Head: Normocephalic and atraumatic.  Mouth/Throat: Oropharynx is clear and moist. No oropharyngeal exudate.  Eyes: EOM are normal. Pupils are equal, round, and reactive to light.  Neck: Normal range of motion. Neck supple. No JVD present.  Cardiovascular:  Normal rate and regular rhythm.  Exam reveals no gallop and no friction rub.   No murmur heard. Pulmonary/Chest: Effort normal and breath sounds normal. No respiratory distress. She has no wheezes. She has no rales. She exhibits no tenderness.  Abdominal: Soft. Bowel sounds are normal. There is no tenderness. There is no rebound and no guarding.  Musculoskeletal: Normal range of motion. She exhibits no edema or tenderness.  Distal pulses intact. No lower extremity swelling, asymmetry or tenderness.  Neurological: She is alert and oriented to person, place, and time.  Patient is alert and oriented x3 with clear, goal oriented speech. Patient has 5/5 motor in all extremities. Sensation is intact to light touch. Bilateral finger-to-nose is normal with no signs of dysmetria. Patient has a normal gait and walks without assistance. Patient is hard of hearing.  Skin: Skin is warm and dry. Capillary refill takes less than 2 seconds. No rash noted. No erythema.  Psychiatric: She has a normal mood and affect.  Her behavior is normal.  Nursing note and vitals reviewed.    ED Treatments / Results  Labs (all labs ordered are listed, but only abnormal results are displayed) Labs Reviewed  COMPREHENSIVE METABOLIC PANEL - Abnormal; Notable for the following:       Result Value   Potassium 3.2 (*)    Total Protein 8.6 (*)    AST 57 (*)    ALT 69 (*)    All other components within normal limits  CBC WITH DIFFERENTIAL/PLATELET  CBG MONITORING, ED    EKG  EKG Interpretation  Date/Time:  Saturday January 20 2017 19:44:53 EST Ventricular Rate:  77 PR Interval:    QRS Duration: 83 QT Interval:  380 QTC Calculation: 430 R Axis:   45 Text Interpretation:  Sinus rhythm Confirmed by Ranae Palms  MD, Tommaso Cavitt ([id number]) on 01/20/2017 7:53:31 PM       Radiology No results found.  Procedures Procedures (including critical care time)  Medications Ordered in ED Medications  potassium chloride SA  (K-DUR,KLOR-CON) CR tablet 40 mEq (40 mEq Oral Given 01/20/17 2135)     Initial Impression / Assessment and Plan / ED Course  I have reviewed the triage vital signs and the nursing notes.  Pertinent labs & imaging results that were available during my care of the patient were reviewed by me and considered in my medical decision making (see chart for details).    Patient with normal blood pressure and neurologic exam. Mildly low potassium. Advised follow-up with her primary physician for blood pressure medication adjustment as needed. Return precautions given.   Final Clinical Impressions(s) / ED Diagnoses   Final diagnoses:  Hypertension, unspecified type  Hypokalemia    New Prescriptions New Prescriptions   No medications on file     Loren Racer, MD 01/20/17 2145

## 2017-01-20 NOTE — ED Notes (Signed)
Pt given a sandwich and cranberry juice d/t Pt feeling like her sugar is dropping.

## 2017-01-20 NOTE — ED Notes (Signed)
Pt reports understanding of discharge information. No questions at time of discharge 

## 2017-01-20 NOTE — ED Triage Notes (Signed)
Pt began having a "funny feeling" in her hands and discovered elevated BP when she checked it at home.  It was 186/126 there.  Pt has had a HA today as well.  She has taken all of her meds as prescribed.

## 2017-03-12 ENCOUNTER — Other Ambulatory Visit: Payer: Self-pay | Admitting: Family Medicine

## 2017-04-10 ENCOUNTER — Other Ambulatory Visit: Payer: Self-pay | Admitting: Family Medicine

## 2017-04-30 ENCOUNTER — Ambulatory Visit: Payer: Medicaid Other | Admitting: Family Medicine

## 2017-05-03 ENCOUNTER — Encounter: Payer: Self-pay | Admitting: Family Medicine

## 2017-05-03 ENCOUNTER — Ambulatory Visit (INDEPENDENT_AMBULATORY_CARE_PROVIDER_SITE_OTHER): Payer: Medicaid Other | Admitting: Family Medicine

## 2017-05-03 VITALS — BP 136/92 | HR 87 | Resp 16 | Ht 68.0 in | Wt 235.0 lb

## 2017-05-03 DIAGNOSIS — E119 Type 2 diabetes mellitus without complications: Secondary | ICD-10-CM

## 2017-05-03 DIAGNOSIS — E785 Hyperlipidemia, unspecified: Secondary | ICD-10-CM

## 2017-05-03 DIAGNOSIS — G8929 Other chronic pain: Secondary | ICD-10-CM

## 2017-05-03 DIAGNOSIS — M25561 Pain in right knee: Secondary | ICD-10-CM | POA: Diagnosis not present

## 2017-05-03 DIAGNOSIS — M25461 Effusion, right knee: Secondary | ICD-10-CM

## 2017-05-03 DIAGNOSIS — R0981 Nasal congestion: Secondary | ICD-10-CM

## 2017-05-03 LAB — CBC WITH DIFFERENTIAL/PLATELET
BASOS PCT: 1 %
Basophils Absolute: 60 cells/uL (ref 0–200)
EOS ABS: 120 {cells}/uL (ref 15–500)
EOS PCT: 2 %
HCT: 36.9 % (ref 35.0–45.0)
Hemoglobin: 11.6 g/dL — ABNORMAL LOW (ref 11.7–15.5)
LYMPHS PCT: 38 %
Lymphs Abs: 2280 cells/uL (ref 850–3900)
MCH: 28.2 pg (ref 27.0–33.0)
MCHC: 31.4 g/dL — ABNORMAL LOW (ref 32.0–36.0)
MCV: 89.6 fL (ref 80.0–100.0)
MONOS PCT: 8 %
MPV: 9.9 fL (ref 7.5–12.5)
Monocytes Absolute: 480 cells/uL (ref 200–950)
Neutro Abs: 3060 cells/uL (ref 1500–7800)
Neutrophils Relative %: 51 %
Platelets: 329 10*3/uL (ref 140–400)
RBC: 4.12 MIL/uL (ref 3.80–5.10)
RDW: 13.5 % (ref 11.0–15.0)
WBC: 6 10*3/uL (ref 3.8–10.8)

## 2017-05-03 LAB — POCT URINALYSIS DIP (DEVICE)
Bilirubin Urine: NEGATIVE
GLUCOSE, UA: NEGATIVE mg/dL
Hgb urine dipstick: NEGATIVE
Nitrite: NEGATIVE
Protein, ur: NEGATIVE mg/dL
SPECIFIC GRAVITY, URINE: 1.015 (ref 1.005–1.030)
Urobilinogen, UA: 0.2 mg/dL (ref 0.0–1.0)
pH: 6 (ref 5.0–8.0)

## 2017-05-03 LAB — COMPLETE METABOLIC PANEL WITH GFR
ALBUMIN: 4.3 g/dL (ref 3.6–5.1)
ALK PHOS: 58 U/L (ref 33–130)
ALT: 77 U/L — AB (ref 6–29)
AST: 68 U/L — ABNORMAL HIGH (ref 10–35)
BILIRUBIN TOTAL: 0.7 mg/dL (ref 0.2–1.2)
BUN: 12 mg/dL (ref 7–25)
CALCIUM: 9.4 mg/dL (ref 8.6–10.4)
CO2: 26 mmol/L (ref 20–31)
CREATININE: 0.89 mg/dL (ref 0.50–1.05)
Chloride: 102 mmol/L (ref 98–110)
GFR, EST AFRICAN AMERICAN: 82 mL/min (ref >=60)
GFR, Est Non African American: 71 mL/min (ref >=60)
Glucose, Bld: 108 mg/dL — ABNORMAL HIGH (ref 65–99)
Potassium: 3.4 mmol/L — ABNORMAL LOW (ref 3.5–5.3)
Sodium: 140 mmol/L (ref 135–146)
TOTAL PROTEIN: 7.9 g/dL (ref 6.1–8.1)

## 2017-05-03 LAB — LIPID PANEL
Cholesterol: 152 mg/dL (ref <200)
HDL: 58 mg/dL (ref >50)
LDL Cholesterol: 77 mg/dL (ref <100)
Total CHOL/HDL Ratio: 2.6 Ratio (ref <5.0)
Triglycerides: 85 mg/dL (ref <150)
VLDL: 17 mg/dL (ref <30)

## 2017-05-03 LAB — POCT GLYCOSYLATED HEMOGLOBIN (HGB A1C): Hemoglobin A1C: 6.5

## 2017-05-03 MED ORDER — METFORMIN HCL 500 MG PO TABS: 500.0000 mg | ORAL_TABLET | Freq: Two times a day (BID) | ORAL | 1 refills | Status: DC

## 2017-05-03 MED ORDER — METHOCARBAMOL 500 MG PO TABS: 500.0000 mg | ORAL_TABLET | Freq: Four times a day (QID) | ORAL | 0 refills | Status: AC

## 2017-05-03 NOTE — Progress Notes (Signed)
Patient ID: Candace Diaz, female    DOB: Oct 07, 1956, 61 y.o.   MRN: [id number]  PCP: Bing Neighbors, FNP  Chief Complaint  Patient presents with  . Follow-up    6 MONTH ON DIABETES    Subjective:  HPI Candace Diaz is a 61 y.o. female presents for a six month diabetes follow-up. Medical problems include: Type 2 Diabetes, Hypertension, Arthritis, hx of CVA  Routinely checks blood sugar and readings have been stable in the low 100's and sometime less than 100. She is currently prescribed Glipizide 10 mgand 10 units of Lantus daily. Last A1C 6.3 nine months prior. She also has hypertension for which she reports good control. Antihypertensive regimen includes  Amlodipine, HCTZ, and Lisinopril. She has suffered from hypokalemia in the past and takes chronic potassium.  Right knee pain feels like the fluid accumulating. In the past she was treated for an effusion of the right knee which required aspiration at a sports medicine practice. Reports fluid in collecting within her right knee again and knee is very painful. Characterizes pain as aching and occasionally throbbing.  She has taken over the counter medications with minimal relief of symptoms.    Social History   Social History  . Marital status: Single    Spouse name: N/A  . Number of children: N/A  . Years of education: N/A   Occupational History  . Not on file.   Social History Main Topics  . Smoking status: Never Smoker  . Smokeless tobacco: Never Used  . Alcohol use No  . Drug use: No  . Sexual activity: Not on file   Other Topics Concern  . Not on file   Social History Narrative  . No narrative on file    Family History  Problem Relation Age of Onset  . Diabetes Brother   . Hypertension Brother   . Diabetes Other   . Hypertension Other    Review of Systems  See HPI  Patient Active Problem List   Diagnosis Date Noted  . HTN (hypertension), malignant 12/03/2014  . DOE (dyspnea on exertion)  12/03/2014  . Acute serous otitis media 08/20/2014  . Essential hypertension, malignant 08/20/2014  . Other and unspecified hyperlipidemia 08/20/2014  . Diabetes mellitus type 2, uncontrolled (HCC) 08/20/2014  . Immunization due 08/20/2014  . Need for Tdap vaccination 08/20/2014  . History of palpitations 08/20/2014  . History of CVA with residual deficit 08/20/2014  . Arthritis of right knee 08/20/2014    Allergies  Allergen Reactions  . Aspirin Other (See Comments)    Reaction uknown  . Penicillins Other (See Comments)    Reaction unknown    Prior to Admission medications   Medication Sig Start Date End Date Taking? Authorizing Provider  ACCU-CHEK AVIVA PLUS test strip USE TO CHECK BLOOD SUGAR TWICE DAILY 09/20/16  Yes Henrietta Hoover, NP  amLODipine (NORVASC) 10 MG tablet Take 1 tablet (10 mg total) by mouth daily. 10/30/16  Yes Henrietta Hoover, NP  EASY TOUCH INSULIN SYRINGE 31G X 5/16" 0.5 ML MISC USE TO INJECT INSULIN EVERY DAY 10/18/16  Yes Henrietta Hoover, NP  glipiZIDE (GLUCOTROL) 10 MG tablet TAKE 1 TABLET BY MOUTH 2 TIMES A DAY BEFORE A MEAL. 04/11/17  Yes Massie Maroon, FNP  hydrochlorothiazide (HYDRODIURIL) 25 MG tablet TAKE 1 TABLET BY MOUTH EVERY MORNING 03/13/17  Yes Massie Maroon, FNP  Lancets (ACCU-CHEK MULTICLIX) lancets USE TO CHECK BLOOD SUGAR TWICE DAILY 12/24/15  Yes Concepcion Living  C, NP  Lancets (ACCU-CHEK MULTICLIX) lancets USE TO CHECK BLOOD SUGAR TWICE DAILY 03/13/17  Yes Massie MaroonHollis, Lachina M, FNP  LANTUS 100 UNIT/ML injection INJECT 20 UNITS SUBCUTANEOUSLY EVERY NIGHT AT BEDTIME 04/11/17  Yes Massie MaroonHollis, Lachina M, FNP  lisinopril (PRINIVIL,ZESTRIL) 10 MG tablet TAKE 1 TABLET BY MOUTH EVERY DAY 03/13/17  Yes Massie MaroonHollis, Lachina M, FNP  Multiple Vitamins-Minerals (MULTIVITAMIN WITH MINERALS) tablet Take 1 tablet by mouth daily.   Yes [provider]  potassium chloride SA (K-DUR,KLOR-CON) 20 MEQ tablet TAKE 1 TABLET BY MOUTH ONCE DAILY WITH FOOD  10/30/16  Yes Henrietta HooverBernhardt, Linda C, NP  pravastatin (PRAVACHOL) 40 MG tablet TAKE 1 TABLET BY MOUTH DAILY 08/23/16  Yes Henrietta HooverBernhardt, Linda C, NP  PROAIR HFA 108 (90 Base) MCG/ACT inhaler INHALE 2 PUFFS BY MOUTH FOUR TIMES A DAY AS NEEDED FOR SHORTNESS OF BREATH. 04/11/17  Yes Massie MaroonHollis, Lachina M, FNP  lidocaine (LIDODERM) 5 % Place 1 patch onto the skin daily. Remove & Discard patch within 12 hours or as directed by MD Patient not taking: Reported on 10/30/2016 02/10/16   Joy, Hillard DankerShawn C, PA-C  methocarbamol (ROBAXIN) 500 MG tablet Take 1 tablet (500 mg total) by mouth 2 (two) times daily. Patient not taking: Reported on 05/03/2017 02/10/16   Anselm PancoastJoy, Shawn C, PA-C  oxyCODONE-acetaminophen (PERCOCET/ROXICET) 5-325 MG tablet Take 1-2 tablets by mouth every 4 (four) hours as needed for severe pain. Patient not taking: Reported on 10/30/2016 02/10/16   Anselm PancoastJoy, Shawn C, PA-C  PROVENTIL HFA 108 (90 BASE) MCG/ACT inhaler INHALE 2 PUFFS BY MOUTH FOUR TIMES A DAY AS NEEDED FOR SHORTNESS OF BREATH Patient not taking: Reported on 10/30/2016 12/06/15   Henrietta HooverBernhardt, Linda C, NP  traMADol Janean Sark(ULTRAM) 50 MG tablet Take 1 to 2 tablets twice a day as needed for pain Patient not taking: Reported on 10/30/2016 11/01/15   Ralene Corkraper, Timothy R, DO    Past Medical, Surgical Family and Social History reviewed and updated.    Objective:   Today's Vitals   05/03/17 0929  BP: (!) 136/92  Pulse: 87  Resp: 16  SpO2: 96%  Weight: 235 lb (106.6 kg)  Height: 5\' 8"  (1.727 m)    Wt Readings from Last 3 Encounters:  05/03/17 235 lb (106.6 kg)  01/20/17 225 lb (102.1 kg)  10/30/16 227 lb (103 kg)    Physical Exam  Constitutional: She is oriented to person, place, and time. She appears well-developed and well-nourished.  HENT:  Head: Normocephalic and atraumatic.  Right Ear: External ear normal.  Left Ear: External ear normal.  Nose: Nose normal.  Mouth/Throat: Oropharynx is clear and moist.  Eyes: Conjunctivae are normal. Pupils are equal,  round, and reactive to light.  Neck: Normal range of motion.  Cardiovascular: Normal rate, regular rhythm, normal heart sounds and intact distal pulses.   Pulmonary/Chest: Effort normal and breath sounds normal.  Abdominal: Soft. Bowel sounds are normal.  Musculoskeletal: She exhibits edema and tenderness.       Right knee: She exhibits effusion. Tenderness found. Lateral joint line tenderness noted.       Legs: Right knee effusion with palpable tenderness   Neurological: She is alert and oriented to person, place, and time. She has normal reflexes.  Skin: Skin is warm and dry.  Psychiatric: She has a normal mood and affect. Her behavior is normal. Judgment and thought content normal.   Assessment & Plan:  1. Type 2 diabetes mellitus without complication, without long-term current use of insulin (HCC) - POCT  glycosylated hemoglobin (Hb A1C)-6.5 - COMPLETE METABOLIC PANEL WITH GFR -Discontinue Glipizide immediately -Continue Lantus for 5 days at bedtime, than discontinue  -Start metformin today, 500 mg twice daily with meals.  2. Chronic pain of right knee 3. Effusion, right knee - CBC with Differential - Ambulatory referral to Orthopedic Surgery  4. Nasal congestion -treat with OTC antihistamine Zrytec and or Allergra   5. Hyperlipidemia, unspecified hyperlipidemia type - Lipid panel -Continue Pravastatn 40 mg once daily  RTC: 3 months for diabetes follow-up  Godfrey Pick. Tiburcio Pea, MSN, [facility] [address] [place], [place] [address] [phone]

## 2017-05-03 NOTE — Patient Instructions (Addendum)
Continue Lantus 10 units at bedtime for 5 days then stop use of Lantus. Start Metformin 500 mg once with breakfast and 500 mg with dinner. Continue taking other medications as prescribed. I will contact you regarding your labs. I am referring you orthopedic surgery for evaluation of right now. Continue monitoring you blood sugar at home.  If readings are greater than 200, please call here at the office [phone] to let me know.

## 2017-05-10 ENCOUNTER — Telehealth: Payer: Self-pay

## 2017-05-10 NOTE — Telephone Encounter (Signed)
Patient states that script have already been taking care of and will be ready for pick up tomorrow.

## 2017-05-18 ENCOUNTER — Encounter (INDEPENDENT_AMBULATORY_CARE_PROVIDER_SITE_OTHER): Payer: Self-pay | Admitting: Orthopaedic Surgery

## 2017-05-18 ENCOUNTER — Ambulatory Visit (INDEPENDENT_AMBULATORY_CARE_PROVIDER_SITE_OTHER): Payer: Medicaid Other

## 2017-05-18 ENCOUNTER — Ambulatory Visit (INDEPENDENT_AMBULATORY_CARE_PROVIDER_SITE_OTHER): Payer: Medicaid Other | Admitting: Orthopaedic Surgery

## 2017-05-18 DIAGNOSIS — G8929 Other chronic pain: Secondary | ICD-10-CM

## 2017-05-18 DIAGNOSIS — M25561 Pain in right knee: Secondary | ICD-10-CM | POA: Diagnosis not present

## 2017-05-18 MED ORDER — BUPIVACAINE HCL 0.5 % IJ SOLN
3.0000 mL | INTRAMUSCULAR | Status: AC | PRN
  Administered 2017-05-18: 3 mL via INTRA_ARTICULAR

## 2017-05-18 MED ORDER — METHYLPREDNISOLONE ACETATE 40 MG/ML IJ SUSP
80.0000 mg | INTRAMUSCULAR | Status: AC | PRN
  Administered 2017-05-18: 80 mg

## 2017-05-18 MED ORDER — LIDOCAINE HCL 1 % IJ SOLN
5.0000 mL | INTRAMUSCULAR | Status: AC | PRN
  Administered 2017-05-18: 5 mL

## 2017-05-18 NOTE — Progress Notes (Signed)
Office Visit Note   Patient: Candace Diaz           Date of Birth: 02/10/1956           MRN: [id number] Visit Date: 05/18/2017              Requested by: Bing NeighborsHarris, Kimberly S, FNP [address] [place], [place] [id number] PCP: Bing NeighborsHarris, Kimberly S, FNP   Assessment & Plan: Visit Diagnoses:  1. Chronic pain of right knee   End-stage osteoarthritis right knee  Plan: Long discussion regarding diagnosis and treatment options. Definitive treatment would be total knee replacement. Candace Diaz is not interested at this point. We talked about cortisone injection and she like to proceed. We'll check back in one month. Will not be a candidate for Visco supplementation with Medicaid  Follow-Up Instructions: No Follow-up on file.   Orders:  Orders Placed This Encounter  Procedures  . XR KNEE 3 VIEW RIGHT   No orders of the defined types were placed in this encounter.     Procedures: Large Joint Inj Date/Time: 05/18/2017 11:07 AM Performed by: Valeria BatmanWHITFIELD, Anayansi Rundquist W Authorized by: Valeria BatmanWHITFIELD, Douglass Shellhammer W   Consent Given by:  Patient Timeout: prior to procedure the correct patient, procedure, and site was verified   Indications:  Pain and joint swelling Location:  Knee Site:  R knee Prep: patient was prepped and draped in usual sterile fashion   Needle Size:  25 G Needle Length:  1.5 inches Approach:  Anteromedial Ultrasound Guidance: No   Fluoroscopic Guidance: No   Arthrogram: No   Medications:  5 mL lidocaine 1 %; 80 mg methylPREDNISolone acetate 40 MG/ML; 3 mL bupivacaine 0.5 % Aspiration Attempted: No   Patient tolerance:  Patient tolerated the procedure well with no immediate complications     Clinical Data: No additional findings.   Subjective: Chief Complaint  Patient presents with  . Right Knee - Pain, Edema    Candace Diaz is a 61 y o that presents with with Right knee pain. HBP, Type 2 Diabetic Right knee pain feels like the fluid accumulating. In the past she was treated for  an effusion of the right knee which required aspiration at a sports medicine practice.  It has been a least 1 year since she was last evaluated for her right knee by her primary care physician. She's had prior knee aspirates and cortisone injection with some temporary relief of her pain. She's also tried over-the-counter medicines. . 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  Physical Exam  Ortho Exam awake alert and oriented 3. Very pleasant. Increased valgus with weightbearing right knee. Lacks about 4 to full extension and flexes about 102 with 3. Very minimal effusion. Predominantly lateral joint pain. Positive patellar crepitation. No calf pain or popliteal fullness. Painless range of motion of right hip. Straight leg raise negative. No thigh or calf pain. +1 pulses.  :    Imaging: Xr Knee 3 View Right  Result Date: 05/18/2017 Films of the right knee were obtained in 3 projections standing. There is complete collapse of the lateral compartment associated with 11 of valgus. There are large osteophytes along the medial the lateral compartment and medial as well. No ectopic calcification. Lateral view demonstrates possible loose body in the posterior capsular area. Large osteophyte at the patellofemoral joint patellofemoral joint demonstrates a large osteophytes but joint space  maintained. All consistent with end-stage osteoarthritis    PMFS History: Patient Active Problem List   Diagnosis Date Noted  . HTN (hypertension), malignant 12/03/2014  . DOE (dyspnea on exertion) 12/03/2014  . Acute serous otitis media 08/20/2014  . Essential hypertension, malignant 08/20/2014  . Other and unspecified hyperlipidemia 08/20/2014  . T2DM (type 2 diabetes mellitus) (HCC) 08/20/2014  . Immunization due 08/20/2014  . Need for Tdap vaccination 08/20/2014  . History of palpitations 08/20/2014    . History of CVA with residual deficit 08/20/2014  . Arthritis of right knee 08/20/2014   Past Medical History:  Diagnosis Date  . Diabetes mellitus   . Hypertension     Family History  Problem Relation Age of Onset  . Diabetes Brother   . Hypertension Brother   . Diabetes Other   . Hypertension Other     History reviewed. No pertinent surgical history. Social History   Occupational History  . Not on file.   Social History Main Topics  . Smoking status: Never Smoker  . Smokeless tobacco: Never Used  . Alcohol use No  . Drug use: No  . Sexual activity: Not on file     Valeria Batman, MD   Note - This record has been created using [employer].  Chart creation errors have been sought, but may not always  have been located. Such creation errors do not reflect on  the standard of medical care.

## 2017-06-18 ENCOUNTER — Ambulatory Visit (INDEPENDENT_AMBULATORY_CARE_PROVIDER_SITE_OTHER): Payer: Medicaid Other | Admitting: Orthopaedic Surgery

## 2017-06-18 ENCOUNTER — Encounter (INDEPENDENT_AMBULATORY_CARE_PROVIDER_SITE_OTHER): Payer: Self-pay | Admitting: Orthopaedic Surgery

## 2017-06-18 VITALS — BP 125/82 | HR 77 | Ht 68.0 in | Wt 235.0 lb

## 2017-06-18 DIAGNOSIS — M25561 Pain in right knee: Secondary | ICD-10-CM

## 2017-06-18 DIAGNOSIS — G8929 Other chronic pain: Secondary | ICD-10-CM

## 2017-06-18 NOTE — Progress Notes (Signed)
Office Visit Note   Patient: Candace Diaz           Date of Birth: 1956/03/08           MRN: [id number] Visit Date: 06/18/2017              Requested by: Bing NeighborsHarris, Kimberly S, FNP [address] [place], [place] [address] PCP: Bing NeighborsHarris, Kimberly S, FNP   Assessment & Plan: Visit Diagnoses:  1. Chronic pain of right knee   End-stage osteoarthritis right knee particularly the lateral compartment. Had a relatively good response to cortisone injection. Mrs. Candace Diaz would like me to talk to her daughter lives out of state regarding her knee. She'll give her our telephone number and she'll call. I think she like to discuss knee replacement at some point. I think for the moment she is relatively comfortable after the cortisone injection. She's not a candidate for Visco supplementation because of her Medicaid Follow-Up Instructions: Return if symptoms worsen or fail to improve.   Orders:  No orders of the defined types were placed in this encounter.  No orders of the defined types were placed in this encounter.     Procedures: No procedures performed   Clinical Data: No additional findings.   Subjective: Chief Complaint  Patient presents with  . Right Knee - Follow-up  Mrs. Candace Diaz relates that she's feeling better after the cortisone injection of her right knee sheath. She woul;d like me to discuss her knee status with her out of state daughter . I have given her our telephone number. I am happy to talk with her at any time. She is not a candidate for Visco supplementation because of medicaid  HPI  Review of Systems   Objective: Vital Signs: BP 125/82   Pulse 77   Ht 5\' 8"  (1.727 m)   Wt 235 lb (106.6 kg)   BMI 35.73 kg/m   Physical Exam  Ortho Exam right knee with very minimal effusion. The knee was not hot red or swollen. No instability. Increased valgus with weightbearing. Full extension and overall 100 of flexion. No calf pain. Neurovascular exam intact. No pain with range  of motion of right hip. Straight leg raise negative  Specialty Comments:  No specialty comments available.  Imaging: No results found.   PMFS History: Patient Active Problem List   Diagnosis Date Noted  . HTN (hypertension), malignant 12/03/2014  . DOE (dyspnea on exertion) 12/03/2014  . Acute serous otitis media 08/20/2014  . Essential hypertension, malignant 08/20/2014  . Other and unspecified hyperlipidemia 08/20/2014  . T2DM (type 2 diabetes mellitus) (HCC) 08/20/2014  . Immunization due 08/20/2014  . Need for Tdap vaccination 08/20/2014  . History of palpitations 08/20/2014  . History of CVA with residual deficit 08/20/2014  . Arthritis of right knee 08/20/2014   Past Medical History:  Diagnosis Date  . Diabetes mellitus   . Hypertension     Family History  Problem Relation Age of Onset  . Diabetes Brother   . Hypertension Brother   . Diabetes Other   . Hypertension Other     History reviewed. No pertinent surgical history. Social History   Occupational History  . Not on file.   Social History Main Topics  . Smoking status: Never Smoker  . Smokeless tobacco: Never Used  . Alcohol use No  . Drug use: No  . Sexual activity: Not on file     Valeria BatmanPeter W Niclas Markell, MD   Note - This record has  been created using [employer].  Chart creation errors have been sought, but may not always  have been located. Such creation errors do not reflect on  the standard of medical care.

## 2017-06-22 ENCOUNTER — Ambulatory Visit (INDEPENDENT_AMBULATORY_CARE_PROVIDER_SITE_OTHER): Payer: Medicaid Other | Admitting: Family Medicine

## 2017-06-22 ENCOUNTER — Other Ambulatory Visit (HOSPITAL_COMMUNITY)
Admission: RE | Admit: 2017-06-22 | Discharge: 2017-06-22 | Disposition: A | Discharge status: Home / Self Care | Payer: Medicaid Other | Source: Ambulatory Visit | Attending: Family Medicine | Admitting: Family Medicine

## 2017-06-22 ENCOUNTER — Encounter: Payer: Self-pay | Admitting: Family Medicine

## 2017-06-22 VITALS — BP 140/90 | HR 78 | Temp 98.2°F | Resp 12 | Ht 68.0 in | Wt 229.0 lb

## 2017-06-22 DIAGNOSIS — Z1212 Encounter for screening for malignant neoplasm of rectum: Secondary | ICD-10-CM

## 2017-06-22 DIAGNOSIS — Z01419 Encounter for gynecological examination (general) (routine) without abnormal findings: Secondary | ICD-10-CM

## 2017-06-22 DIAGNOSIS — Z1231 Encounter for screening mammogram for malignant neoplasm of breast: Secondary | ICD-10-CM | POA: Diagnosis not present

## 2017-06-22 DIAGNOSIS — Z1239 Encounter for other screening for malignant neoplasm of breast: Secondary | ICD-10-CM

## 2017-06-22 LAB — POCT URINALYSIS DIP (DEVICE)
Bilirubin Urine: NEGATIVE
Glucose, UA: NEGATIVE mg/dL
Hgb urine dipstick: NEGATIVE
Ketones, ur: NEGATIVE mg/dL
NITRITE: NEGATIVE
PH: 6 (ref 5.0–8.0)
PROTEIN: 30 mg/dL — AB
Specific Gravity, Urine: 1.025 (ref 1.005–1.030)
Urobilinogen, UA: 2 mg/dL — ABNORMAL HIGH (ref 0.0–1.0)

## 2017-06-22 NOTE — Patient Instructions (Addendum)
To schedule your breast exam, please call [facility] at   Phone: [phone]   You will be notified of any abnormal results of your gynecological exam.

## 2017-06-22 NOTE — Progress Notes (Signed)
Patient ID: Candace Diaz, female    DOB: 1956/05/03, 62 y.o.   MRN: [id number]  PCP: Bing Neighbors, FNP  Chief Complaint  Patient presents with  . Gynecologic Exam    Subjective:  HPI Candace Diaz is a 61 y.o. female presents for evaluation of gynecological exam. Candace Diaz is unaware of when her last PAP smear occurred. To her knowledge, she doesn't recall any abnormal PAP pathology. She denies any vaginal pain, burning with urination, vaginal discharge, breast pain, or nipple discharge. She is overdue for her mammogram, and reports that she will schedule within the near future. She denies any known family history of breast cancer.  Social History   Social History  . Marital status: Single    Spouse name: N/A  . Number of children: N/A  . Years of education: N/A   Occupational History  . Not on file.   Social History Main Topics  . Smoking status: Never Smoker  . Smokeless tobacco: Never Used  . Alcohol use No  . Drug use: No  . Sexual activity: Not on file   Other Topics Concern  . Not on file   Social History Narrative  . No narrative on file    Family History  Problem Relation Age of Onset  . Diabetes Brother   . Hypertension Brother   . Diabetes Other   . Hypertension Other    Review of Systems See history of present illness Patient Active Problem List   Diagnosis Date Noted  . HTN (hypertension), malignant 12/03/2014  . DOE (dyspnea on exertion) 12/03/2014  . Acute serous otitis media 08/20/2014  . Essential hypertension, malignant 08/20/2014  . Other and unspecified hyperlipidemia 08/20/2014  . T2DM (type 2 diabetes mellitus) (HCC) 08/20/2014  . Immunization due 08/20/2014  . Need for Tdap vaccination 08/20/2014  . History of palpitations 08/20/2014  . History of CVA with residual deficit 08/20/2014  . Arthritis of right knee 08/20/2014    Allergies  Allergen Reactions  . Aspirin Other (See Comments)    Reaction uknown  . Penicillins Other (See  Comments)    Reaction unknown    Prior to Admission medications   Medication Sig Start Date End Date Taking? Authorizing Provider  ACCU-CHEK AVIVA PLUS test strip USE TO CHECK BLOOD SUGAR TWICE DAILY 09/20/16  Yes Henrietta Hoover, NP  amLODipine (NORVASC) 10 MG tablet Take 1 tablet (10 mg total) by mouth daily. 10/30/16  Yes Henrietta Hoover, NP  EASY TOUCH INSULIN SYRINGE 31G X 5/16" 0.5 ML MISC USE TO INJECT INSULIN EVERY DAY 10/18/16  Yes Henrietta Hoover, NP  hydrochlorothiazide (HYDRODIURIL) 25 MG tablet TAKE 1 TABLET BY MOUTH EVERY MORNING 03/13/17  Yes Massie Maroon, FNP  Lancets (ACCU-CHEK MULTICLIX) lancets USE TO CHECK BLOOD SUGAR TWICE DAILY 12/24/15  Yes Henrietta Hoover, NP  Lancets (ACCU-CHEK MULTICLIX) lancets USE TO CHECK BLOOD SUGAR TWICE DAILY 03/13/17  Yes Massie Maroon, FNP  lisinopril (PRINIVIL,ZESTRIL) 10 MG tablet TAKE 1 TABLET BY MOUTH EVERY DAY 03/13/17  Yes Massie Maroon, FNP  metFORMIN (GLUCOPHAGE) 500 MG tablet Take 1 tablet (500 mg total) by mouth 2 (two) times daily with a meal. 05/03/17  Yes Bing Neighbors, FNP  methocarbamol (ROBAXIN) 500 MG tablet Take 1 tablet (500 mg total) by mouth 4 (four) times daily. 05/03/17 08/01/17 Yes Bing Neighbors, FNP  Multiple Vitamins-Minerals (MULTIVITAMIN WITH MINERALS) tablet Take 1 tablet by mouth daily.   Yes [provider]  oxyCODONE-acetaminophen (  PERCOCET/ROXICET) 5-325 MG tablet Take 1-2 tablets by mouth every 4 (four) hours as needed for severe pain. 02/10/16  Yes Joy, Shawn C, PA-C  potassium chloride SA (K-DUR,KLOR-CON) 20 MEQ tablet TAKE 1 TABLET BY MOUTH ONCE DAILY WITH FOOD 10/30/16  Yes Henrietta HooverBernhardt, Linda C, NP  pravastatin (PRAVACHOL) 40 MG tablet TAKE 1 TABLET BY MOUTH DAILY 08/23/16  Yes Henrietta HooverBernhardt, Linda C, NP  PROAIR HFA 108 (90 Base) MCG/ACT inhaler INHALE 2 PUFFS BY MOUTH FOUR TIMES A DAY AS NEEDED FOR SHORTNESS OF BREATH. 04/11/17  Yes Massie MaroonHollis, Lachina M, FNP  PROVENTIL HFA 108 (90 BASE)  MCG/ACT inhaler INHALE 2 PUFFS BY MOUTH FOUR TIMES A DAY AS NEEDED FOR SHORTNESS OF BREATH 12/06/15  Yes Henrietta HooverBernhardt, Linda C, NP  traMADol (ULTRAM) 50 MG tablet Take 1 to 2 tablets twice a day as needed for pain 11/01/15  Yes Draper, Timothy R, DO  lidocaine (LIDODERM) 5 % Place 1 patch onto the skin daily. Remove & Discard patch within 12 hours or as directed by MD Patient not taking: Reported on 06/22/2017 02/10/16   Joy, Hillard DankerShawn C, PA-C  methocarbamol (ROBAXIN) 500 MG tablet Take 1 tablet (500 mg total) by mouth 2 (two) times daily. Patient not taking: Reported on 06/22/2017 02/10/16   Concepcion LivingJoy, Shawn C, PA-C    Past Medical, Surgical Family and Social History reviewed and updated.    Objective:   Today's Vitals   06/22/17 0941  BP: 140/90  Pulse: 78  Resp: 12  Temp: 98.2 F (36.8 C)  TempSrc: Oral  SpO2: 100%  Weight: 229 lb (103.9 kg)  Height: 5\' 8"  (1.727 m)    Wt Readings from Last 3 Encounters:  06/22/17 229 lb (103.9 kg)  06/18/17 235 lb (106.6 kg)  05/03/17 235 lb (106.6 kg)   Physical Exam  Constitutional: She is oriented to person, place, and time. She appears well-developed and well-nourished.  Cardiovascular: Normal rate, regular rhythm, normal heart sounds and intact distal pulses.   Pulmonary/Chest: Effort normal and breath sounds normal.  Genitourinary:  Genitourinary Comments: Breasts are symmetric without cutaneous changes, nipple inversion or discharge. No masses or tenderness, and no axillary lymphadenopathy. Normal female external genitalia without lesion. No inguinal lymphadenopathy. Vaginal mucosa is pink and moist without lesions. Cervix is without discharge, not friable. Pap smear obtained. No cervical motion tenderness, adnexal fullness or tenderness.  Musculoskeletal: Normal range of motion.  Neurological: She is alert and oriented to person, place, and time.  Psychiatric: She has a normal mood and affect. Her behavior is normal. Judgment and thought content  normal.   Assessment & Plan:  1. Well female exam with routine gynecological exam - Microalbumin, urine, protein in urine  - Cytology - PAP [place] - Urine Culture  2. Breast screening - MM Digital Screening; Future  3. Screening for rectal cancer - POCT occult blood stool-negative   RTC: Keep current scheduled follow-up in August. Recheck her A1c and chronic disease management.  Godfrey PickKimberly S. Tiburcio PeaHarris, MSN, FNP-C [facility]  [address] [place], [place] [address] [phone]

## 2017-06-23 LAB — MICROALBUMIN, URINE: MICROALB UR: 6.6 mg/dL

## 2017-06-24 LAB — URINE CULTURE

## 2017-06-25 LAB — CYTOLOGY - PAP
BACTERIAL VAGINITIS: NEGATIVE
Candida vaginitis: NEGATIVE
Chlamydia: NEGATIVE
DIAGNOSIS: NEGATIVE
NEISSERIA GONORRHEA: NEGATIVE
Trichomonas: NEGATIVE

## 2017-06-27 MED ORDER — CLINDAMYCIN HCL 300 MG PO CAPS: 300.0000 mg | ORAL_CAPSULE | Freq: Three times a day (TID) | ORAL | 0 refills | Status: DC

## 2017-06-27 NOTE — Addendum Note (Signed)
Addended by: Bing NeighborsHARRIS, Latriece Anstine S on: 06/27/2017 09:20 PM   Modules accepted: Orders

## 2017-06-29 ENCOUNTER — Telehealth: Payer: Self-pay

## 2017-06-29 MED ORDER — CLINDAMYCIN HCL 300 MG PO CAPS: 300.0000 mg | ORAL_CAPSULE | Freq: Three times a day (TID) | ORAL | 0 refills | Status: DC

## 2017-06-29 NOTE — Telephone Encounter (Signed)
Spoke with patient [facility]

## 2017-07-17 ENCOUNTER — Ambulatory Visit
Admission: RE | Admit: 2017-07-17 | Discharge: 2017-07-17 | Disposition: A | Discharge status: Home / Self Care | Payer: Medicaid Other | Source: Ambulatory Visit | Attending: Family Medicine | Admitting: Family Medicine

## 2017-07-17 DIAGNOSIS — Z1231 Encounter for screening mammogram for malignant neoplasm of breast: Secondary | ICD-10-CM | POA: Diagnosis not present

## 2017-07-17 DIAGNOSIS — Z1239 Encounter for other screening for malignant neoplasm of breast: Secondary | ICD-10-CM

## 2017-08-03 ENCOUNTER — Encounter: Payer: Self-pay | Admitting: Family Medicine

## 2017-08-03 ENCOUNTER — Ambulatory Visit (INDEPENDENT_AMBULATORY_CARE_PROVIDER_SITE_OTHER): Payer: Medicaid Other | Admitting: Family Medicine

## 2017-08-03 VITALS — BP 128/82 | HR 82 | Temp 97.8°F | Resp 14 | Ht 68.0 in | Wt 227.0 lb

## 2017-08-03 DIAGNOSIS — E119 Type 2 diabetes mellitus without complications: Secondary | ICD-10-CM | POA: Diagnosis not present

## 2017-08-03 DIAGNOSIS — R7989 Other specified abnormal findings of blood chemistry: Secondary | ICD-10-CM | POA: Diagnosis not present

## 2017-08-03 DIAGNOSIS — R945 Abnormal results of liver function studies: Secondary | ICD-10-CM

## 2017-08-03 LAB — POCT GLYCOSYLATED HEMOGLOBIN (HGB A1C): Hemoglobin A1C: 8.7

## 2017-08-03 MED ORDER — METFORMIN HCL 1000 MG PO TABS: 1000.0000 mg | ORAL_TABLET | Freq: Two times a day (BID) | ORAL | 2 refills | Status: DC

## 2017-08-03 MED ORDER — SITAGLIPTIN PHOSPHATE 25 MG PO TABS: 25.0000 mg | ORAL_TABLET | Freq: Every day | ORAL | 3 refills | Status: DC

## 2017-08-03 NOTE — Progress Notes (Signed)
Patient ID: Candace Diaz, female    DOB: 1956-10-07, 61 y.o.   MRN: [id number]  PCP: Bing NeighborsHarris, Neithan Day S, FNP  Chief Complaint  Patient presents with  . Follow-up    3 month    Subjective:  HPI Candace Diaz is a 61 y.o. female presents for evaluation of 3 month diabetes follow-up. Medical problems include: Hypertension, diabetes, history of a CVA, and obesity.  [facility] monitors her glucose at home. Adheres to current medication regimen. During her last visit, we trailed discontinuation of Lantus as her hemoglobin (A1C 6.5) had improved and she wanted to see how well her diabetes would be controlled on oral medication only. Today she reports increased urinary frequency. She has continued to obtain readings in the 100s and 200's, however hasn't notified the clinic of readings greater than 200 as was previously recommended during her prior visit. She does not engage in routine physical exercise and is mostly sedentary. She denies chest pain, shortness of breath, headaches, or dizziness. Candace PennaZola was also found to have elevated LFT's during her last office visit. She denies alcohol consumption and has not hx of hepatitis or fatty liver disease.  Social History   Social History  . Marital status: Single    Spouse name: N/A  . Number of children: N/A  . Years of education: N/A   Occupational History  . Not on file.   Social History Main Topics  . Smoking status: Never Smoker  . Smokeless tobacco: Never Used  . Alcohol use No  . Drug use: No  . Sexual activity: Not on file   Other Topics Concern  . Not on file   Social History Narrative  . No narrative on file    Family History  Problem Relation Age of Onset  . Diabetes Brother   . Hypertension Brother   . Diabetes Other   . Hypertension Other    Review of Systems  Respiratory: Negative.   Cardiovascular: Negative.   Endocrine: Positive for polyuria. Negative for cold intolerance, heat intolerance, polydipsia and polyphagia.   Neurological: Negative for dizziness and headaches.    Patient Active Problem List   Diagnosis Date Noted  . HTN (hypertension), malignant 12/03/2014  . DOE (dyspnea on exertion) 12/03/2014  . Acute serous otitis media 08/20/2014  . Essential hypertension, malignant 08/20/2014  . Other and unspecified hyperlipidemia 08/20/2014  . T2DM (type 2 diabetes mellitus) (HCC) 08/20/2014  . Immunization due 08/20/2014  . Need for Tdap vaccination 08/20/2014  . History of palpitations 08/20/2014  . History of CVA with residual deficit 08/20/2014  . Arthritis of right knee 08/20/2014    Allergies  Allergen Reactions  . Aspirin Other (See Comments)    Reaction uknown  . Penicillins Other (See Comments)    Reaction unknown    Prior to Admission medications   Medication Sig Start Date End Date Taking? Authorizing Provider  ACCU-CHEK AVIVA PLUS test strip USE TO CHECK BLOOD SUGAR TWICE DAILY 09/20/16  Yes Henrietta HooverBernhardt, Linda C, NP  amLODipine (NORVASC) 10 MG tablet Take 1 tablet (10 mg total) by mouth daily. 10/30/16  Yes Henrietta HooverBernhardt, Linda C, NP  EASY TOUCH INSULIN SYRINGE 31G X 5/16" 0.5 ML MISC USE TO INJECT INSULIN EVERY DAY 10/18/16  Yes Henrietta HooverBernhardt, Linda C, NP  hydrochlorothiazide (HYDRODIURIL) 25 MG tablet TAKE 1 TABLET BY MOUTH EVERY MORNING 03/13/17  Yes Massie MaroonHollis, Lachina M, FNP  Lancets (ACCU-CHEK MULTICLIX) lancets USE TO CHECK BLOOD SUGAR TWICE DAILY 12/24/15  Yes Henrietta HooverBernhardt, Linda C, NP  lisinopril (PRINIVIL,ZESTRIL) 10 MG tablet TAKE 1 TABLET BY MOUTH EVERY DAY 03/13/17  Yes Massie Maroon, FNP  Multiple Vitamins-Minerals (MULTIVITAMIN WITH MINERALS) tablet Take 1 tablet by mouth daily.   Yes [provider]  pravastatin (PRAVACHOL) 40 MG tablet TAKE 1 TABLET BY MOUTH DAILY 08/23/16  Yes Henrietta Hoover, NP  PROVENTIL HFA 108 (90 BASE) MCG/ACT inhaler INHALE 2 PUFFS BY MOUTH FOUR TIMES A DAY AS NEEDED FOR SHORTNESS OF BREATH 12/06/15  Yes Henrietta Hoover, NP  clindamycin  (CLEOCIN) 300 MG capsule Take 1 capsule (300 mg total) by mouth 3 (three) times daily with meals. Patient not taking: Reported on 08/03/2017 06/29/17   Bing Neighbors, FNP  Lancets (ACCU-CHEK MULTICLIX) lancets USE TO CHECK BLOOD SUGAR TWICE DAILY 03/13/17   Massie Maroon, FNP  lidocaine (LIDODERM) 5 % Place 1 patch onto the skin daily. Remove & Discard patch within 12 hours or as directed by MD Patient not taking: Reported on 06/22/2017 02/10/16   Anselm Pancoast, PA-C  metFORMIN (GLUCOPHAGE) 500 MG tablet Take 1 tablet (500 mg total) by mouth 2 (two) times daily with a meal. 05/03/17   Bing Neighbors, FNP  methocarbamol (ROBAXIN) 500 MG tablet Take 1 tablet (500 mg total) by mouth 2 (two) times daily. Patient not taking: Reported on 06/22/2017 02/10/16   Anselm Pancoast, PA-C  oxyCODONE-acetaminophen (PERCOCET/ROXICET) 5-325 MG tablet Take 1-2 tablets by mouth every 4 (four) hours as needed for severe pain. Patient not taking: Reported on 08/03/2017 02/10/16   Joy, Ines Bloomer C, PA-C  potassium chloride SA (K-DUR,KLOR-CON) 20 MEQ tablet TAKE 1 TABLET BY MOUTH ONCE DAILY WITH FOOD Patient not taking: Reported on 08/03/2017 10/30/16   Henrietta Hoover, NP  PROAIR HFA 108 (90 Base) MCG/ACT inhaler INHALE 2 PUFFS BY MOUTH FOUR TIMES A DAY AS NEEDED FOR SHORTNESS OF BREATH. Patient not taking: Reported on 08/03/2017 04/11/17   Massie Maroon, FNP  traMADol Janean Sark) 50 MG tablet Take 1 to 2 tablets twice a day as needed for pain Patient not taking: Reported on 08/03/2017 11/01/15   Ralene Cork, DO    Past Medical, Surgical Family and Social History reviewed and updated.    Objective:   Today's Vitals   08/03/17 0951  BP: 128/82  Pulse: 82  Resp: 14  Temp: 97.8 F (36.6 C)  TempSrc: Oral  SpO2: 96%  Weight: 227 lb (103 kg)  Height: 5\' 8"  (1.727 m)    Wt Readings from Last 3 Encounters:  08/03/17 227 lb (103 kg)  06/22/17 229 lb (103.9 kg)  06/18/17 235 lb (106.6 kg)    Physical Exam   Constitutional: She is oriented to person, place, and time. She appears well-developed and well-nourished.  HENT:  Head: Normocephalic and atraumatic.  Eyes: Pupils are equal, round, and reactive to light. Conjunctivae and EOM are normal.  Neck: Neck supple. No thyromegaly present.  Cardiovascular: Normal rate, regular rhythm, normal heart sounds and intact distal pulses.   Pulmonary/Chest: Effort normal and breath sounds normal.  Musculoskeletal: Normal range of motion.  Neurological: She is alert and oriented to person, place, and time.  Skin: Skin is warm and dry.  Psychiatric: She has a normal mood and affect. Her behavior is normal. Judgment and thought content normal.   Assessment & Plan:  1. Type 2 diabetes mellitus without complication, without long-term current use of insulin (HCC) 2. Elevated LFTs A1c today is 8.1 compared to 6.5, 3 months ago. Patient  was trialed with discontinuing Lantus 3 months ago and managing diabetes on metformin only. We will add Januvia today 25 mg daily. Ordering diabetes education for dietary management education. If A1C remains the same or increases we will resume bedtime Lantus in addition to Januvia and metformin. Elevated LFTs have been chronic over the last couple years. Patient denies a history of alcohol abuse or use. Elevated LFTs likely related to fatty liver disease. Will continue to monitor LFTs if trending down and hold off on imaging . If LFT's are the same or trending up will  order a liver ultrasound.  Orders Placed This Encounter - POCT glycosylated hemoglobin (Hb A1C) - Ambulatory referral to diabetic education -COMPLETE METABOLIC PANEL WITH GFR  RTC: 3 months  Godfrey Pick. Tiburcio Pea, MSN, FNP-C [facility]  [address] [place], [place] [address] [phone]

## 2017-08-03 NOTE — Patient Instructions (Addendum)
Increase Metformin 1000 mg twice daily with meals.  I am Januvia 25 mg once daily with food.   I am placing a referral for you to see a diabetes educator.   Your A1C today is 8.7, your goal is less than 7.0%.  Continue to increase physical activity to at least 150 minutes per week.  Diabetes Mellitus and Food It is important for you to manage your blood sugar (glucose) level. Your blood glucose level can be greatly affected by what you eat. Eating healthier foods in the appropriate amounts throughout the day at about the same time each day will help you control your blood glucose level. It can also help slow or prevent worsening of your diabetes mellitus. Healthy eating may even help you improve the level of your blood pressure and reach or maintain a healthy weight. General recommendations for healthful eating and cooking habits include:  Eating meals and snacks regularly. Avoid going long periods of time without eating to lose weight.  Eating a diet that consists mainly of plant-based foods, such as fruits, vegetables, nuts, legumes, and whole grains.  Using low-heat cooking methods, such as baking, instead of high-heat cooking methods, such as deep frying.  Work with your dietitian to make sure you understand how to use the Nutrition Facts information on food labels. How can food affect me? Carbohydrates Carbohydrates affect your blood glucose level more than any other type of food. Your dietitian will help you determine how many carbohydrates to eat at each meal and teach you how to count carbohydrates. Counting carbohydrates is important to keep your blood glucose at a healthy level, especially if you are using insulin or taking certain medicines for diabetes mellitus. Alcohol Alcohol can cause sudden decreases in blood glucose (hypoglycemia), especially if you use insulin or take certain medicines for diabetes mellitus. Hypoglycemia can be a life-threatening condition. Symptoms of  hypoglycemia (sleepiness, dizziness, and disorientation) are similar to symptoms of having too much alcohol. If your health care provider has given you approval to drink alcohol, do so in moderation and use the following guidelines:  Women should not have more than one drink per day, and men should not have more than two drinks per day. One drink is equal to: ? 12 oz of beer. ? 5 oz of wine. ? 1 oz of hard liquor.  Do not drink on an empty stomach.  Keep yourself hydrated. Have water, diet soda, or unsweetened iced tea.  Regular soda, juice, and other mixers might contain a lot of carbohydrates and should be counted.  What foods are not recommended? As you make food choices, it is important to remember that all foods are not the same. Some foods have fewer nutrients per serving than other foods, even though they might have the same number of calories or carbohydrates. It is difficult to get your body what it needs when you eat foods with fewer nutrients. Examples of foods that you should avoid that are high in calories and carbohydrates but low in nutrients include:  Trans fats (most processed foods list trans fats on the Nutrition Facts label).  Regular soda.  Juice.  Candy.  Sweets, such as cake, pie, doughnuts, and cookies.  Fried foods.  What foods can I eat? Eat nutrient-rich foods, which will nourish your body and keep you healthy. The food you should eat also will depend on several factors, including:  The calories you need.  The medicines you take.  Your weight.  Your blood glucose level.  Your blood pressure level.  Your cholesterol level.  You should eat a variety of foods, including:  Protein. ? Lean cuts of meat. ? Proteins low in saturated fats, such as fish, egg whites, and beans. Avoid processed meats.  Fruits and vegetables. ? Fruits and vegetables that may help control blood glucose levels, such as apples, mangoes, and yams.  Dairy  products. ? Choose fat-free or low-fat dairy products, such as milk, yogurt, and cheese.  Grains, bread, pasta, and rice. ? Choose whole grain products, such as multigrain bread, whole oats, and brown rice. These foods may help control blood pressure.  Fats. ? Foods containing healthful fats, such as nuts, avocado, olive oil, canola oil, and fish.  Does everyone with diabetes mellitus have the same meal plan? Because every person with diabetes mellitus is different, there is not one meal plan that works for everyone. It is very important that you meet with a dietitian who will help you create a meal plan that is just right for you. This information is not intended to replace advice given to you by your health care provider. Make sure you discuss any questions you have with your health care provider. Document Released: 09/07/2005 Document Revised: 05/18/2016 Document Reviewed: 11/07/2013 Elsevier Interactive Patient Education  2017 [employer].

## 2017-08-04 LAB — COMPLETE METABOLIC PANEL WITH GFR
ALK PHOS: 57 U/L (ref 33–130)
ALT: 69 U/L — AB (ref 6–29)
AST: 48 U/L — ABNORMAL HIGH (ref 10–35)
Albumin: 4.4 g/dL (ref 3.6–5.1)
BUN: 12 mg/dL (ref 7–25)
CHLORIDE: 99 mmol/L (ref 98–110)
CO2: 29 mmol/L (ref 20–32)
CREATININE: 0.79 mg/dL (ref 0.50–0.99)
Calcium: 9.2 mg/dL (ref 8.6–10.4)
GFR, Est Non African American: 82 mL/min (ref >=60)
Glucose, Bld: 257 mg/dL — ABNORMAL HIGH (ref 65–99)
Potassium: 3.3 mmol/L — ABNORMAL LOW (ref 3.5–5.3)
Sodium: 137 mmol/L (ref 135–146)
TOTAL PROTEIN: 7.6 g/dL (ref 6.1–8.1)
Total Bilirubin: 0.6 mg/dL (ref 0.2–1.2)

## 2017-08-06 MED ORDER — POTASSIUM CHLORIDE CRYS ER 20 MEQ PO TBCR: 20.0000 meq | EXTENDED_RELEASE_TABLET | Freq: Every day | ORAL | 1 refills | Status: DC

## 2017-08-10 ENCOUNTER — Other Ambulatory Visit: Payer: Self-pay

## 2017-08-10 MED ORDER — METFORMIN HCL 1000 MG PO TABS: 1000.0000 mg | ORAL_TABLET | Freq: Two times a day (BID) | ORAL | 2 refills | Status: DC

## 2017-08-10 MED ORDER — POTASSIUM CHLORIDE CRYS ER 20 MEQ PO TBCR: 20.0000 meq | EXTENDED_RELEASE_TABLET | Freq: Every day | ORAL | 1 refills | Status: DC

## 2017-08-10 MED ORDER — SITAGLIPTIN PHOSPHATE 25 MG PO TABS: 25.0000 mg | ORAL_TABLET | Freq: Every day | ORAL | 3 refills | Status: DC

## 2017-08-31 ENCOUNTER — Encounter: Payer: Self-pay | Admitting: Family Medicine

## 2017-08-31 ENCOUNTER — Ambulatory Visit (INDEPENDENT_AMBULATORY_CARE_PROVIDER_SITE_OTHER): Payer: Medicaid Other | Admitting: Family Medicine

## 2017-08-31 VITALS — BP 134/82 | HR 71 | Temp 97.7°F | Resp 12 | Ht 68.0 in | Wt 218.0 lb

## 2017-08-31 DIAGNOSIS — E118 Type 2 diabetes mellitus with unspecified complications: Secondary | ICD-10-CM

## 2017-08-31 DIAGNOSIS — Z23 Encounter for immunization: Secondary | ICD-10-CM

## 2017-08-31 LAB — GLUCOSE, CAPILLARY: GLUCOSE-CAPILLARY: 155 mg/dL — AB (ref 65–99)

## 2017-08-31 MED ORDER — INSULIN GLARGINE 100 UNIT/ML SOLOSTAR PEN: 20.0000 [IU] | PEN_INJECTOR | Freq: Every day | SUBCUTANEOUS | 99 refills | Status: DC

## 2017-08-31 MED ORDER — INSULIN PEN NEEDLE 30G X 8 MM MISC: 5 refills | Status: DC

## 2017-08-31 NOTE — Progress Notes (Signed)
Patient ID: Candace Diaz, female    DOB: 09/24/1956, 61 y.o.   MRN: [id number]  PCP: Bing Neighbors, FNP  Chief Complaint  Patient presents with  . Follow-up    6 WEEK    Subjective:  HPI Candace Diaz is a 61 y.o. female presents for a 6 week follow-up of diabetes. Candace Diaz recently was seen in office for diabetes follow-up and her A1C 8.7 which had increase from prior 3 months which was 6.5. She was previously prescribed Lantus which was discontinued back in May as patient wished to try to control diabetes with lifestyle changes and oral antidiabetic medications. She reports continued elevated glucose readings when monitored at home. Denies associated neuropathy, increased urination, polyphagia, or polydipsia.  Social History   Social History  . Marital status: Single    Spouse name: N/A  . Number of children: N/A  . Years of education: N/A   Occupational History  . Not on file.   Social History Main Topics  . Smoking status: Never Smoker  . Smokeless tobacco: Never Used  . Alcohol use No  . Drug use: No  . Sexual activity: Not on file   Other Topics Concern  . Not on file   Social History Narrative  . No narrative on file    Family History  Problem Relation Age of Onset  . Diabetes Brother   . Hypertension Brother   . Diabetes Other   . Hypertension Other    Review of Systems See HPI Patient Active Problem List   Diagnosis Date Noted  . HTN (hypertension), malignant 12/03/2014  . DOE (dyspnea on exertion) 12/03/2014  . Acute serous otitis media 08/20/2014  . Essential hypertension, malignant 08/20/2014  . Other and unspecified hyperlipidemia 08/20/2014  . T2DM (type 2 diabetes mellitus) (HCC) 08/20/2014  . Immunization due 08/20/2014  . Need for Tdap vaccination 08/20/2014  . History of palpitations 08/20/2014  . History of CVA with residual deficit 08/20/2014  . Arthritis of right knee 08/20/2014    Allergies  Allergen Reactions  . Aspirin Other (See  Comments)    Reaction uknown  . Penicillins Other (See Comments)    Reaction unknown    Prior to Admission medications   Medication Sig Start Date End Date Taking? Authorizing Provider  ACCU-CHEK AVIVA PLUS test strip USE TO CHECK BLOOD SUGAR TWICE DAILY 09/20/16  Yes Henrietta Hoover, NP  amLODipine (NORVASC) 10 MG tablet Take 1 tablet (10 mg total) by mouth daily. 10/30/16  Yes Henrietta Hoover, NP  EASY TOUCH INSULIN SYRINGE 31G X 5/16" 0.5 ML MISC USE TO INJECT INSULIN EVERY DAY 10/18/16  Yes Henrietta Hoover, NP  hydrochlorothiazide (HYDRODIURIL) 25 MG tablet TAKE 1 TABLET BY MOUTH EVERY MORNING 03/13/17  Yes Massie Maroon, FNP  Lancets (ACCU-CHEK MULTICLIX) lancets USE TO CHECK BLOOD SUGAR TWICE DAILY 12/24/15  Yes Henrietta Hoover, NP  Lancets (ACCU-CHEK MULTICLIX) lancets USE TO CHECK BLOOD SUGAR TWICE DAILY 03/13/17  Yes Massie Maroon, FNP  lidocaine (LIDODERM) 5 % Place 1 patch onto the skin daily. Remove & Discard patch within 12 hours or as directed by MD 02/10/16  Yes Joy, Shawn C, PA-C  lisinopril (PRINIVIL,ZESTRIL) 10 MG tablet TAKE 1 TABLET BY MOUTH EVERY DAY 03/13/17  Yes Massie Maroon, FNP  metFORMIN (GLUCOPHAGE) 1000 MG tablet Take 1 tablet (1,000 mg total) by mouth 2 (two) times daily with a meal. 08/10/17  Yes Bing Neighbors, FNP  Multiple Vitamins-Minerals (MULTIVITAMIN WITH  MINERALS) tablet Take 1 tablet by mouth daily.   Yes [provider]  potassium chloride SA (K-DUR,KLOR-CON) 20 MEQ tablet Take 1 tablet (20 mEq total) by mouth daily. 08/10/17  Yes Bing NeighborsHarris, Levonne Carreras S, FNP  PROAIR HFA 108 [phone](90 Base) MCG/ACT inhaler INHALE 2 PUFFS BY MOUTH FOUR TIMES A DAY AS NEEDED FOR SHORTNESS OF BREATH. 04/11/17  Yes Massie MaroonHollis, Lachina M, FNP  sitaGLIPtin (JANUVIA) 25 MG tablet Take 1 tablet (25 mg total) by mouth daily. 08/10/17  Yes Bing NeighborsHarris, Malanie Koloski S, FNP  methocarbamol (ROBAXIN) 500 MG tablet Take 1 tablet (500 mg total) by mouth 2 (two) times daily. Patient  not taking: Reported on 06/22/2017 02/10/16   Anselm PancoastJoy, Shawn C, PA-C  oxyCODONE-acetaminophen (PERCOCET/ROXICET) 5-325 MG tablet Take 1-2 tablets by mouth every 4 (four) hours as needed for severe pain. Patient not taking: Reported on 08/03/2017 02/10/16   Harolyn RutherfordJoy, Shawn C, PA-C  potassium chloride SA (K-DUR,KLOR-CON) 20 MEQ tablet TAKE 1 TABLET BY MOUTH ONCE DAILY WITH FOOD Patient not taking: Reported on 08/31/2017 10/30/16   Henrietta HooverBernhardt, Linda C, NP  pravastatin (PRAVACHOL) 40 MG tablet TAKE 1 TABLET BY MOUTH DAILY 08/23/16   Henrietta HooverBernhardt, Linda C, NP  PROVENTIL HFA 108 (90 BASE) MCG/ACT inhaler INHALE 2 PUFFS BY MOUTH FOUR TIMES A DAY AS NEEDED FOR SHORTNESS OF BREATH Patient not taking: Reported on 08/31/2017 12/06/15   Henrietta HooverBernhardt, Linda C, NP  traMADol Janean Sark(ULTRAM) 50 MG tablet Take 1 to 2 tablets twice a day as needed for pain Patient not taking: Reported on 08/03/2017 11/01/15   Ralene Corkraper, Timothy R, DO    Past Medical, Surgical Family and Social History reviewed and updated.    Objective:   Today's Vitals   08/31/17 1041  BP: 134/82  Pulse: 71  Resp: 12  Temp: 97.7 F (36.5 C)  TempSrc: Oral  SpO2: 99%  Weight: 218 lb (98.9 kg)  Height: 5\' 8"  (1.727 m)    Wt Readings from Last 3 Encounters:  08/31/17 218 lb (98.9 kg)  08/03/17 227 lb (103 kg)  06/22/17 229 lb (103.9 kg)   Physical Exam  Constitutional: She is oriented to person, place, and time. She appears well-developed and well-nourished.  HENT:  Head: Normocephalic and atraumatic.  Eyes: Pupils are equal, round, and reactive to light. Conjunctivae and EOM are normal.  Neck: Normal range of motion. Neck supple.  Cardiovascular: Normal rate, regular rhythm, normal heart sounds and intact distal pulses.   Pulmonary/Chest: Effort normal and breath sounds normal.  Musculoskeletal: Normal range of motion.  Neurological: She is alert and oriented to person, place, and time.  Skin: Skin is warm and dry.  Psychiatric: She has a normal mood and  affect. Her behavior is normal. Judgment and thought content normal.   Assessment & Plan:  1. Type 2 diabetes mellitus with complication, without long-term current use of insulin (HCC) -Resume Lantus 20 units at bedtime once daily -Continue Januvia and Metformin  -CBG 155 today  2. Need for influenza vaccination - Flu Vaccine QUAD 36+ mos IM  RTC: 3 months for diabetes follow-up    Godfrey PickKimberly S. Tiburcio PeaHarris, MSN, FNP-C [facility]  [address] [place], [place] [address] [phone]

## 2017-08-31 NOTE — Patient Instructions (Addendum)
I am resuming Lantus 20 units at bedtime one daily. Eat high protein snack, cheese, yogurt, slice of [place]key or peanut butter.  Continue Januvia and Metformin as prescribed. You will return 3 months for diabetes follow-up.   Diabetes Mellitus and Food It is important for you to manage your blood sugar (glucose) level. Your blood glucose level can be greatly affected by what you eat. Eating healthier foods in the appropriate amounts throughout the day at about the same time each day will help you control your blood glucose level. It can also help slow or prevent worsening of your diabetes mellitus. Healthy eating may even help you improve the level of your blood pressure and reach or maintain a healthy weight. General recommendations for healthful eating and cooking habits include:  Eating meals and snacks regularly. Avoid going long periods of time without eating to lose weight.  Eating a diet that consists mainly of plant-based foods, such as fruits, vegetables, nuts, legumes, and whole grains.  Using low-heat cooking methods, such as baking, instead of high-heat cooking methods, such as deep frying.  Work with your dietitian to make sure you understand how to use the Nutrition Facts information on food labels. How can food affect me? Carbohydrates Carbohydrates affect your blood glucose level more than any other type of food. Your dietitian will help you determine how many carbohydrates to eat at each meal and teach you how to count carbohydrates. Counting carbohydrates is important to keep your blood glucose at a healthy level, especially if you are using insulin or taking certain medicines for diabetes mellitus. Alcohol Alcohol can cause sudden decreases in blood glucose (hypoglycemia), especially if you use insulin or take certain medicines for diabetes mellitus. Hypoglycemia can be a life-threatening condition. Symptoms of hypoglycemia (sleepiness, dizziness, and disorientation) are similar  to symptoms of having too much alcohol. If your health care provider has given you approval to drink alcohol, do so in moderation and use the following guidelines:  Women should not have more than one drink per day, and men should not have more than two drinks per day. One drink is equal to: ? 12 oz of beer. ? 5 oz of wine. ? 1 oz of hard liquor.  Do not drink on an empty stomach.  Keep yourself hydrated. Have water, diet soda, or unsweetened iced tea.  Regular soda, juice, and other mixers might contain a lot of carbohydrates and should be counted.  What foods are not recommended? As you make food choices, it is important to remember that all foods are not the same. Some foods have fewer nutrients per serving than other foods, even though they might have the same number of calories or carbohydrates. It is difficult to get your body what it needs when you eat foods with fewer nutrients. Examples of foods that you should avoid that are high in calories and carbohydrates but low in nutrients include:  Trans fats (most processed foods list trans fats on the Nutrition Facts label).  Regular soda.  Juice.  Candy.  Sweets, such as cake, pie, doughnuts, and cookies.  Fried foods.  What foods can I eat? Eat nutrient-rich foods, which will nourish your body and keep you healthy. The food you should eat also will depend on several factors, including:  The calories you need.  The medicines you take.  Your weight.  Your blood glucose level.  Your blood pressure level.  Your cholesterol level.  You should eat a variety of foods, including:  Protein. ?  Lean cuts of meat. ? Proteins low in saturated fats, such as fish, egg whites, and beans. Avoid processed meats.  Fruits and vegetables. ? Fruits and vegetables that may help control blood glucose levels, such as apples, mangoes, and yams.  Dairy products. ? Choose fat-free or low-fat dairy products, such as milk, yogurt, and  cheese.  Grains, bread, pasta, and rice. ? Choose whole grain products, such as multigrain bread, whole oats, and brown rice. These foods may help control blood pressure.  Fats. ? Foods containing healthful fats, such as nuts, avocado, olive oil, canola oil, and fish.  Does everyone with diabetes mellitus have the same meal plan? Because every person with diabetes mellitus is different, there is not one meal plan that works for everyone. It is very important that you meet with a dietitian who will help you create a meal plan that is just right for you. This information is not intended to replace advice given to you by your health care provider. Make sure you discuss any questions you have with your health care provider. Document Released: 09/07/2005 Document Revised: 05/18/2016 Document Reviewed: 11/07/2013 Elsevier Interactive Patient Education  2017 [employer].

## 2017-09-18 ENCOUNTER — Encounter: Payer: Self-pay | Admitting: Registered"

## 2017-09-18 ENCOUNTER — Encounter: Payer: Medicaid Other | Attending: Family Medicine | Admitting: Registered"

## 2017-09-18 DIAGNOSIS — E119 Type 2 diabetes mellitus without complications: Secondary | ICD-10-CM | POA: Insufficient documentation

## 2017-09-18 DIAGNOSIS — Z713 Dietary counseling and surveillance: Secondary | ICD-10-CM | POA: Diagnosis not present

## 2017-09-18 DIAGNOSIS — E1100 Type 2 diabetes mellitus with hyperosmolarity without nonketotic hyperglycemic-hyperosmolar coma (NKHHC): Secondary | ICD-10-CM

## 2017-09-18 NOTE — Progress Notes (Signed)
Diabetes Self-Management Education  Visit Type: First/Initial  Appt. Start Time: 0945 Appt. End Time: 1055  09/18/2017  [name], identified by name and date of birth, is a 61 y.o. female with a diagnosis of Diabetes: Type 2.   ASSESSMENT Per chart last A1C was 8.7% which was a jump from the May value of 6.5%. Per chart patient was started back on insulin to bring this number down. Pt also states making some dietary changes, eating more vegetables, baked chicken, not too much fried food, and cut out a lot of starch. Pt reports that before she started back on insulin, her BG was often over 200, but now states her FBG is always under 100. Pt reports checking sugar before meals with values around 80 mg/dL. Pt states she did not know she should check BG after meals.  Pt states several episodes of low blood sugar. RD advised to contact her doctor if this continues to be a problem.  Pt was accompanied by her daughter who was able to assist with patient's ability to understand education.     Diabetes Self-Management Education - 09/18/17 0955      Visit Information   Visit Type First/Initial     Initial Visit   Diabetes Type Type 2   Are you currently following a meal plan? Yes   What type of meal plan do you follow? has cut out a lot of starch and fried foods, eating more vegetables & baked meat   Are you taking your medications as prescribed? Yes     Health Coping   How would you rate your overall health? Fair     Psychosocial Assessment   Patient Belief/Attitude about Diabetes Motivated to manage diabetes   Self-care barriers Hard of hearing;Low literacy   Other persons present Family Member   Special Needs Simplified materials   Learning Readiness Ready   How often do you need to have someone help you when you read instructions, pamphlets, or other written materials from your doctor or pharmacy? 4 - Often   What is the last grade level you completed in school? 9th      Complications   Last HgB A1C per patient/outside source 8.7 %  per MD chart notes 08/26/2017   How often do you check your blood sugar? 1-2 times/day   Fasting Blood glucose range (mg/dL) 40-981  90   Postprandial Blood glucose range (mg/dL) --   Number of hypoglycemic episodes per month 2   Can you tell when your blood sugar is low? Yes   What do you do if your blood sugar is low? 4 oz apple juice, grape juice   Number of hyperglycemic episodes per week 0   Have you had a dilated eye exam in the past 12 months? Yes   Have you had a dental exam in the past 12 months? Yes   Are you checking your feet? Yes   How many days per week are you checking your feet? 7     Dietary Intake   Breakfast cereal & apple juice OR egg, bacon, apple sauce   Snack (morning) none   Lunch soup or sandwich    Snack (afternoon) PB crackers   Dinner baked meat, potato, greens, salad with ranch dressing   Snack (evening) none   Beverage(s) water, coffee, apple juice, cranberry juice     Exercise   Exercise Type Moderate (swimming / aerobic walking)  ab 360 machine   How many days per week  to you exercise? 4   How many minutes per day do you exercise? 30   Total minutes per week of exercise 120     Patient Education   Previous Diabetes Education No   Nutrition management  Role of diet in the treatment of diabetes and the relationship between the three main macronutrients and blood glucose level;Food label reading, portion sizes and measuring food.;Carbohydrate counting   Chronic complications Assessed and discussed foot care and prevention of foot problems;Retinopathy and reason for yearly dilated eye exams     Individualized Goals (developed by patient)   Nutrition Follow meal plan discussed   Monitoring  test my blood glucose as discussed     Outcomes   Expected Outcomes Demonstrated interest in learning. Expect positive outcomes   Future DMSE 4-6 wks   Program Status Not Completed     Individualized Plan for Diabetes Self-Management Training:   Learning Objective:  Patient will have a greater understanding of diabetes self-management. Patient education plan is to attend individual and/or group sessions per assessed needs and concerns.  Patient Instructions  Plan:  Aim for 3-5 Carb Choices per meal (45-60 grams) +/- 1 either way  Aim for 0-30 Carbs per snack if hungry  Include protein in moderation with your meals and snacks Consider reading food labels for Total Carbohydrate Continue your activity daily as tolerated Consider checking blood sugar first thing in the morning and 2 hours after a meal.  If you have low blood sugar often or if your sugar numbers are in the low 100s after meals, please contact your doctor because she might want to change your medication. Take your blood sugar log to your doctors appointments [place] taking medication as directed by MD Consider having oatmeal with a handful of nuts, pecans and walnuts, is a good idea  Expected Outcomes:  Demonstrated interest in learning. Expect positive outcomes  Education material provided: My Plate and Snack sheet, Planning Healthy Meals, BG log book  If problems or questions, patient to contact team via:  Phone and MyChart  Future DSME appointment: 4-6 wks

## 2017-09-18 NOTE — Patient Instructions (Signed)
Plan:  Aim for 3-5 Carb Choices per meal (45-60 grams) +/- 1 either way  Aim for 0-30 Carbs per snack if hungry  Include protein in moderation with your meals and snacks Consider reading food labels for Total Carbohydrate Continue your activity daily as tolerated Consider checking blood sugar first thing in the morning and 2 hours after a meal.  If you have low blood sugar often or if your sugar numbers are in the low 100s after meals, please contact your doctor because she might want to change your medication. Take your blood sugar log to your doctors appointments [place] taking medication as directed by MD Consider having oatmeal with a handful of nuts, pecans and walnuts, is a good idea

## 2017-10-15 ENCOUNTER — Telehealth: Payer: Self-pay

## 2017-10-15 MED ORDER — AMLODIPINE BESYLATE 10 MG PO TABS: 10.0000 mg | ORAL_TABLET | Freq: Every day | ORAL | 3 refills | Status: DC

## 2017-10-15 MED ORDER — LISINOPRIL 10 MG PO TABS: 10.0000 mg | ORAL_TABLET | Freq: Every day | ORAL | 2 refills | Status: DC

## 2017-10-15 NOTE — Telephone Encounter (Signed)
Medication refilled

## 2017-10-18 ENCOUNTER — Other Ambulatory Visit: Payer: Self-pay

## 2017-10-23 ENCOUNTER — Ambulatory Visit: Payer: Medicaid Other | Admitting: Registered"

## 2017-10-25 ENCOUNTER — Encounter: Payer: Medicaid Other | Attending: Family Medicine | Admitting: Registered"

## 2017-10-25 DIAGNOSIS — E119 Type 2 diabetes mellitus without complications: Secondary | ICD-10-CM | POA: Insufficient documentation

## 2017-10-25 DIAGNOSIS — E118 Type 2 diabetes mellitus with unspecified complications: Secondary | ICD-10-CM

## 2017-10-25 DIAGNOSIS — Z713 Dietary counseling and surveillance: Secondary | ICD-10-CM | POA: Insufficient documentation

## 2017-10-25 NOTE — Progress Notes (Signed)
Diabetes Self-Management Education  Visit Type: Follow-up  Appt. Start Time: 1025 Appt. End Time: 11  10/29/2017  [name], identified by name and date of birth, is a 61 y.o. female with a diagnosis of Diabetes: Type 2.   ASSESSMENT This patient is accompanied in the office by her daughter and grandaughter. Patient often does not understand questions, such as "how long after you eat do you wait to take your BG?" and daughter helps interpret.  Patient brought in glucose log. Pt is poor historian regarding timing to food. Patient takes insulin at night, but while out of time ran out and readings were consistently >200 mg/dL. While taking insulin FBG are 167-260 mg/dL  RD reinforced importance of taking insulin and also asked she check 2 hrs post meal. Patient also states she does not like checking BG and was very interested in the [employer] libre. Diabetes Self-Management Education - 10/29/17 0806      Visit Information   Visit Type  Follow-up      Initial Visit   Diabetes Type  Type 2    Are you currently following a meal plan?  Yes    What type of meal plan do you follow?  carb control    Are you taking your medications as prescribed?  Yes      Psychosocial Assessment   Self-care barriers  Hard of hearing;Low literacy    Other persons present  Family Member    Learning Readiness  Change in progress      Complications   How often do you check your blood sugar?  1-2 times/day    Fasting Blood glucose range (mg/dL)  161-096;045-409;>811130-179;180-200;>200 brought in written log   brought in written log   Number of hypoglycemic episodes per month  0    Number of hyperglycemic episodes per week  3      Dietary Intake   Breakfast  egg, bacon, bread, 1/2 c diet juice    Snack (morning)  none OR fruit    Lunch  chicken pot pie    Snack (afternoon)  none    Dinner  fried chicken, mashed potatoes, green beans ([employer])    Snack (evening)  none    Beverage(s)  green tea (diet), water, diet  juice      Patient Education   Nutrition management   Reviewed blood glucose goals for pre and post meals and how to evaluate the patients' food intake on their blood glucose level.    Physical activity and exercise   Role of exercise on diabetes management, blood pressure control and cardiac health.    Medications  Reviewed patients medication for diabetes, action, purpose, timing of dose and side effects.      Individualized Goals (developed by patient)   Nutrition  Follow meal plan discussed do not eat carbohydrates alone   do not eat carbohydrates alone   Medications  take my medication as prescribed      Outcomes   Expected Outcomes  Demonstrated interest in learning. Expect positive outcomes    Future DMSE  2 wks    Program Status  Completed      Subsequent Visit   Since your last visit have you continued or begun to take your medications as prescribed?  No ran out of insulin while out of town, FBG >200   ran out of insulin while out of town, FBG >200   Since your last visit, are you checking your blood glucose at least once a  day?  Yes     Individualized Plan for Diabetes Self-Management Training:   Learning Objective:  Patient will have a greater understanding of diabetes self-management. Patient education plan is to attend individual and/or group sessions per assessed needs and concerns.   Patient Instructions  1. Bring your blood sugar log and food log to next appointment in 2 weeks 2. Ask your doctor about the freestyle libre glucose monitor. 3. Whenever you see a number over 200, write down what you ate.  Food: Eat nuts with your oatmeal Continue eating broccoli, salad, greens When eating fruit, also eat protein  Expected Outcomes:  Demonstrated interest in learning. Expect positive outcomes  Education material provided: [employer] brochure  If problems or questions, patient to contact team via:  Phone  Future DSME appointment: 2 wks

## 2017-10-25 NOTE — Patient Instructions (Addendum)
1. Bring your blood sugar log and food log to next appointment in 2 weeks 2. Ask your doctor about the freestyle libre glucose monitor. 3. Whenever you see a number over 200, write down what you ate.  Food: Eat nuts with your oatmeal Continue eating broccoli, salad, greens When eating fruit, also eat protein

## 2017-11-05 MED ORDER — AMLODIPINE BESYLATE 10 MG PO TABS: 10.0000 mg | ORAL_TABLET | Freq: Every day | ORAL | 3 refills | Status: DC

## 2017-11-08 ENCOUNTER — Encounter: Payer: Medicaid Other | Admitting: Registered"

## 2017-11-08 DIAGNOSIS — E118 Type 2 diabetes mellitus with unspecified complications: Secondary | ICD-10-CM

## 2017-11-08 DIAGNOSIS — Z713 Dietary counseling and surveillance: Secondary | ICD-10-CM | POA: Diagnosis not present

## 2017-11-08 NOTE — Progress Notes (Signed)
Diabetes Self-Management Education  Visit Type: Follow-up  Appt. Start Time: 10:53 Appt. End Time: 11:20  11/08/2017  [name], identified by name and date of birth, is a 61 y.o. female with a diagnosis of Diabetes: Type 2.   ASSESSMENT Patient arrives by herself today and brought her food log and meter. Patient stated her sugar is up because her blood pressure is up, however according to her meter, her blood sugar is within target ranges. Although patient is not able to understand most questions, RD believed she must be taking her insulin more consistently than before because her BG readings are much better. RD noted several readings in the mid 70s, patient states she has not had any hypoglycemia symptoms. Pt brought in food log showing 3 balanced meals per day. RD asked about snacks, which she has not been writing down. Pt reports candy, little debbies, and fruit as snacks. Pt seemed to understand how to build a balanced plate with activity of planning Thanksgiving meal with food models.  Diabetes Self-Management Education - 11/08/17 1100      Visit Information   Visit Type  Follow-up      Initial Visit   Diabetes Type  Type 2    Are you currently following a meal plan?  Yes    What type of meal plan do you follow?  carb control    Are you taking your medications as prescribed?  Yes      Psychosocial Assessment   Self-care barriers  Hard of hearing;Low literacy    Other persons present  Patient      Complications   Fasting Blood glucose range (mg/dL)  45-409;[id number];130-179 [phone] for both fasting and PPBG   76-132 for both fasting and PPBG   Number of hypoglycemic episodes per month  0    Number of hyperglycemic episodes per week  0      Dietary Intake   Breakfast  egg, oatmeal, nuts, coffee    Snack (morning)  none OR small candy bar    Lunch  chicken salad, nuts, tea    Snack (afternoon)  little debbies    Dinner  chicken, green, baked potato. green tea    Snack  (evening)  none    Beverage(s)  greent tea, coffee, water      Patient Education   Nutrition management   Food label reading, portion sizes and measuring food.      Individualized Goals (developed by patient)   Nutrition  Follow meal plan discussed      Outcomes   Expected Outcomes  Demonstrated interest in learning. Expect positive outcomes    Future DMSE  PRN    Program Status  Completed      Subsequent Visit   Since your last visit have you continued or begun to take your medications as prescribed?  -- Pt poor historian, but per BG meter it looks like she is    Pt poor historian, but per BG meter it looks like she is    Since your last visit have you had your blood pressure checked?  Yes    Is your most recent blood pressure lower, unchanged, or higher since your last visit?  -- pt states is high, but not able to quantify   pt states is high, but not able to quantify   Since your last visit, are you checking your blood glucose at least once a day?  Yes     Individualized Plan for Diabetes Self-Management Training:  Learning Objective:  Patient will have a greater understanding of diabetes self-management. Patient education plan is to attend individual and/or group sessions per assessed needs and concerns.  Patient Instructions  Eat nuts with fruit. Remember to use my plate guidelines at Thanksgiving. If your blood sugar goes over 200 more than 4 times in a week, start writing down what you eat and come back in to see me. Give your daughter the snack sheet for grocery shopping ideas   Expected Outcomes:  Demonstrated interest in learning. Expect positive outcomes  Education material provided: Snack sheet  If problems or questions, patient to contact team via:  Phone  Future DSME appointment: PRN

## 2017-11-08 NOTE — Patient Instructions (Addendum)
Eat nuts with fruit. Remember to use my plate guidelines at Thanksgiving. If your blood sugar goes over 200 more than 4 times in a week, start writing down what you eat and come back in to see me. Give your daughter the snack sheet for grocery shopping ideas

## 2017-11-30 ENCOUNTER — Encounter: Payer: Self-pay | Admitting: Family Medicine

## 2017-11-30 ENCOUNTER — Ambulatory Visit: Payer: Medicaid Other | Admitting: Family Medicine

## 2017-11-30 VITALS — BP 140/86 | HR 74 | Temp 98.7°F | Resp 16 | Ht 68.0 in | Wt 210.0 lb

## 2017-11-30 DIAGNOSIS — E118 Type 2 diabetes mellitus with unspecified complications: Secondary | ICD-10-CM | POA: Diagnosis not present

## 2017-11-30 DIAGNOSIS — I10 Essential (primary) hypertension: Secondary | ICD-10-CM | POA: Diagnosis not present

## 2017-11-30 DIAGNOSIS — E162 Hypoglycemia, unspecified: Secondary | ICD-10-CM | POA: Diagnosis not present

## 2017-11-30 DIAGNOSIS — E119 Type 2 diabetes mellitus without complications: Secondary | ICD-10-CM | POA: Diagnosis not present

## 2017-11-30 LAB — POCT URINALYSIS DIP (DEVICE)
BILIRUBIN URINE: NEGATIVE
Glucose, UA: NEGATIVE mg/dL
KETONES UR: 80 mg/dL — AB
Nitrite: NEGATIVE
PH: 6.5 (ref 5.0–8.0)
PROTEIN: 30 mg/dL — AB
SPECIFIC GRAVITY, URINE: 1.015 (ref 1.005–1.030)
Urobilinogen, UA: 0.2 mg/dL (ref 0.0–1.0)

## 2017-11-30 LAB — POCT GLYCOSYLATED HEMOGLOBIN (HGB A1C): Hemoglobin A1C: 6.4

## 2017-11-30 LAB — GLUCOSE, CAPILLARY
Glucose-Capillary: 61 mg/dL — ABNORMAL LOW (ref 65–99)
Glucose-Capillary: 78 mg/dL (ref 65–99)

## 2017-11-30 MED ORDER — LISINOPRIL 20 MG PO TABS: 20.0000 mg | ORAL_TABLET | Freq: Every day | ORAL | 2 refills | Status: DC

## 2017-11-30 MED ORDER — INSULIN GLARGINE 100 UNIT/ML SOLOSTAR PEN: 10.0000 [IU] | PEN_INJECTOR | Freq: Every day | SUBCUTANEOUS | 8 refills | Status: DC

## 2017-11-30 MED ORDER — INSULIN PEN NEEDLE 30G X 8 MM MISC: 5 refills | Status: DC

## 2017-11-30 NOTE — Progress Notes (Signed)
0

## 2017-11-30 NOTE — Progress Notes (Signed)
Patient ID: Candace Diaz, female    DOB: 1956-08-07, 61 y.o.   MRN: [id number]  PCP: Bing NeighborsHarris, Hagan Maltz S, FNP  Chief Complaint  Patient presents with  . Follow-up    3 MONTH    Subjective:  HPI Candace Diaz is a 61 y.o. female presents for evaluation of hypertension and type 2 diabetes. Darrick PennaZola reports overall good health and  diabetes control. Monitors glucose at home daily. Adheres to current medication regimen. Last A1C 8.7, four months prior. Denies any associated urinary frequency,polydipsia, visual disturbances, or episodic diaphoresis. She has started routine engagement in physical activity. Currrent Body mass index is 31.93 kg/m.  Darrick PennaZola reports no home monitoring of blood pressure., although take medication as prescribed. Reports efforts to adhere to low sodium diet. She is a nonsmoker. Denies any episodes of dizziness,headaches, shortness of breath, or chest pain.  Social History   Socioeconomic History  . Marital status: Single    Spouse name: Not on file  . Number of children: Not on file  . Years of education: Not on file  . Highest education level: Not on file  Social Needs  . Financial resource strain: Not on file  . Food insecurity - worry: Not on file  . Food insecurity - inability: Not on file  . Transportation needs - medical: Not on file  . Transportation needs - non-medical: Not on file  Occupational History  . Not on file  Tobacco Use  . Smoking status: Never Smoker  . Smokeless tobacco: Never Used  Substance and Sexual Activity  . Alcohol use: No  . Drug use: No  . Sexual activity: Not on file  Other Topics Concern  . Not on file  Social History Narrative  . Not on file    Family History  Problem Relation Age of Onset  . Diabetes Brother   . Hypertension Brother   . Diabetes Other   . Hypertension Other    Review of Systems  HENT: Negative.   Eyes: Negative.   Respiratory: Negative.   Cardiovascular: Negative.   Gastrointestinal: Negative.    Endocrine: Negative.   Genitourinary: Negative.   Neurological: Negative.   Psychiatric/Behavioral: Negative.     Patient Active Problem List   Diagnosis Date Noted  . HTN (hypertension), malignant 12/03/2014  . DOE (dyspnea on exertion) 12/03/2014  . Acute serous otitis media 08/20/2014  . Essential hypertension, malignant 08/20/2014  . Other and unspecified hyperlipidemia 08/20/2014  . T2DM (type 2 diabetes mellitus) (HCC) 08/20/2014  . Immunization due 08/20/2014  . Need for Tdap vaccination 08/20/2014  . History of palpitations 08/20/2014  . History of CVA with residual deficit 08/20/2014  . Arthritis of right knee 08/20/2014    Allergies  Allergen Reactions  . Aspirin Other (See Comments)    Reaction uknown  . Penicillins Other (See Comments)    Reaction unknown    Prior to Admission medications   Medication Sig Start Date End Date Taking? Authorizing Provider  ACCU-CHEK AVIVA PLUS test strip USE TO CHECK BLOOD SUGAR TWICE DAILY 09/20/16  Yes Henrietta HooverBernhardt, Linda C, NP  amLODipine (NORVASC) 10 MG tablet Take 1 tablet (10 mg total) daily by mouth. 11/05/17  Yes Bing NeighborsHarris, Gladstone Rosas S, FNP  hydrochlorothiazide (HYDRODIURIL) 25 MG tablet TAKE 1 TABLET BY MOUTH EVERY MORNING 03/13/17  Yes Massie MaroonHollis, Lachina M, FNP  Insulin Pen Needle (NOVOFINE) 30G X 8 MM MISC Administer insulin subcutaneously once daily. ICD 10 E.11 08/31/17  Yes Bing NeighborsHarris, Tashiba Timoney S, FNP  Lancets (ACCU-CHEK MULTICLIX)  lancets USE TO CHECK BLOOD SUGAR TWICE DAILY 12/24/15  Yes Henrietta Hoover, NP  Lancets (ACCU-CHEK MULTICLIX) lancets USE TO CHECK BLOOD SUGAR TWICE DAILY 03/13/17  Yes Massie Maroon, FNP  lisinopril (PRINIVIL,ZESTRIL) 10 MG tablet Take 1 tablet (10 mg total) by mouth daily. 10/15/17  Yes Bing Neighbors, FNP  metFORMIN (GLUCOPHAGE) 1000 MG tablet Take 1 tablet (1,000 mg total) by mouth 2 (two) times daily with a meal. 08/10/17  Yes Bing Neighbors, FNP  Multiple Vitamins-Minerals (MULTIVITAMIN WITH  MINERALS) tablet Take 1 tablet by mouth daily.   Yes [provider]  PROAIR HFA 108 (90 Base) MCG/ACT inhaler INHALE 2 PUFFS BY MOUTH FOUR TIMES A DAY AS NEEDED FOR SHORTNESS OF BREATH. 04/11/17  Yes Massie Maroon, FNP  sitaGLIPtin (JANUVIA) 25 MG tablet Take 1 tablet (25 mg total) by mouth daily. 08/10/17  Yes Bing Neighbors, FNP  EASY TOUCH INSULIN SYRINGE 31G X 5/16" 0.5 ML MISC USE TO INJECT INSULIN EVERY DAY 10/18/16   Henrietta Hoover, NP  Insulin Glargine (LANTUS SOLOSTAR) 100 UNIT/ML Solostar Pen Inject 20 Units into the skin daily at 10 pm. 08/31/17   Bing Neighbors, FNP  lidocaine (LIDODERM) 5 % Place 1 patch onto the skin daily. Remove & Discard patch within 12 hours or as directed by MD Patient not taking: Reported on 11/30/2017 02/10/16   Joy, Hillard Danker, PA-C  methocarbamol (ROBAXIN) 500 MG tablet Take 1 tablet (500 mg total) by mouth 2 (two) times daily. Patient not taking: Reported on 06/22/2017 02/10/16   Anselm Pancoast, PA-C  oxyCODONE-acetaminophen (PERCOCET/ROXICET) 5-325 MG tablet Take 1-2 tablets by mouth every 4 (four) hours as needed for severe pain. Patient not taking: Reported on 08/03/2017 02/10/16   Joy, Ines Bloomer C, PA-C  potassium chloride SA (K-DUR,KLOR-CON) 20 MEQ tablet TAKE 1 TABLET BY MOUTH ONCE DAILY WITH FOOD Patient not taking: Reported on 08/31/2017 10/30/16   Henrietta Hoover, NP  potassium chloride SA (K-DUR,KLOR-CON) 20 MEQ tablet Take 1 tablet (20 mEq total) by mouth daily. Patient not taking: Reported on 11/30/2017 08/10/17   Bing Neighbors, FNP  pravastatin (PRAVACHOL) 40 MG tablet TAKE 1 TABLET BY MOUTH DAILY Patient not taking: Reported on 11/30/2017 08/23/16   Henrietta Hoover, NP  PROVENTIL HFA 108 (90 BASE) MCG/ACT inhaler INHALE 2 PUFFS BY MOUTH FOUR TIMES A DAY AS NEEDED FOR SHORTNESS OF BREATH Patient not taking: Reported on 08/31/2017 12/06/15   Henrietta Hoover, NP  traMADol Janean Sark) 50 MG tablet Take 1 to 2 tablets twice a day as needed  for pain Patient not taking: Reported on 08/03/2017 11/01/15   Ralene Cork, DO    Past Medical, Surgical Family and Social History reviewed and updated.    Objective:   Today's Vitals   11/30/17 1047  BP: 140/86  Pulse: 74  Resp: 16  Temp: 98.7 F (37.1 C)  TempSrc: Oral  SpO2: 98%  Weight: 210 lb (95.3 kg)  Height: 5\' 8"  (1.727 m)    Wt Readings from Last 3 Encounters:  11/30/17 210 lb (95.3 kg)  08/31/17 218 lb (98.9 kg)  08/03/17 227 lb (103 kg)   Physical Exam Constitutional: Patient appears well-developed and well-nourished. No distress. HENT: Normocephalic, atraumatic, External right and left ear normal. Oropharynx is clear and moist.  Eyes: Conjunctivae and EOM are normal. PERRLA, no scleral icterus. Neck: Normal ROM. Neck supple. No JVD. No tracheal deviation. No thyromegaly. CVS: RRR, S1/S2 +, no murmurs,  no gallops, no carotid bruit.  Pulmonary: Effort and breath sounds normal, no stridor, rhonchi, wheezes, rales.  Abdominal: Soft. BS +, no distension, tenderness, rebound or guarding.  Musculoskeletal: Normal range of motion. No edema and no tenderness.  Lymphadenopathy: No lymphadenopathy noted, cervical, inguinal or axillary Neuro: Alert. Normal reflexes, muscle tone coordination. No cranial nerve deficit. Skin: Skin is warm and dry. No rash noted. Not diaphoretic. No erythema. No pallor. Psychiatric: Normal mood and affect. Behavior, judgment, thought content normal.    Assessment & Plan:  1. HTN (hypertension), Stable, not at goal of <130/80.  Increase lisinopril to 20 mg once daily. We have discussed target BP range and blood pressure goal. I have advised patient to check BP regularly and to call us back or report to clinic if the numbers are consistently higher than 140/90. We discussed the importance of compliance with medical therapy and DASH diet recommended, consequences of uncontrolled hypertension discussed.   2. Type 2 diabetes mellitus with  complication, without long-term current use of insulin (HCC).  See today is 6.4 which is very well controlled. Continue Lantus 10 units daily at bedtime.  Eat a high protein snack at bedtime with administering Lantus.  Continue to increase physical activity 250 minutes/week as tolerated.  Continue to adhere to a diabetic modified carbohydrate diet.  3.  Hypoglycemia.  She reports that she was fasting at today's visit and her blood sugar on arrival was 61.  She was treated with peanut butter, crackers, and juice. Glucose on recheck 78. Patient is asymptomatic.  Meds ordered this encounter  Medications  . Insulin Glargine (LANTUS SOLOSTAR) 100 UNIT/ML Solostar Pen    Sig: Inject 10 Units into the skin daily at 10 pm.    Dispense:  5 pen    Refill:  8    Order Specific Question:   Supervising Provider    Answer:   Quentin AngstJEGEDE, OLUGBEMIGA E [id number]]  . lisinopril (PRINIVIL,ZESTRIL) 20 MG tablet    Sig: Take 1 tablet (20 mg total) by mouth daily.    Dispense:  90 tablet    Refill:  2    Order Specific Question:   Supervising Provider    Answer:   Quentin AngstJEGEDE, OLUGBEMIGA E [id number]]  . Insulin Pen Needle (NOVOFINE) 30G X 8 MM MISC    Sig: Administer insulin subcutaneously once daily. ICD 10 E.11    Dispense:  100 each    Refill:  5    Order Specific Question:   Supervising Provider    Answer:   Quentin AngstJEGEDE, OLUGBEMIGA E [[id number]]    Orders Placed This Encounter  Procedures  . Glucose, capillary  . Glucose, capillary  . POCT glycosylated hemoglobin (Hb A1C)  . POCT urinalysis dip (device)    Return for care 3 months for chronic condition management.   Godfrey PickKimberly S. Tiburcio PeaHarris, MSN, FNP-C [facility]  [address] [place], [place] [address] [phone]   .[email or web address]

## 2017-12-06 ENCOUNTER — Other Ambulatory Visit: Payer: Self-pay | Admitting: Family Medicine

## 2017-12-10 ENCOUNTER — Telehealth: Payer: Self-pay | Admitting: Family Medicine

## 2017-12-10 MED ORDER — HYDROCHLOROTHIAZIDE 25 MG PO TABS: 25.0000 mg | ORAL_TABLET | Freq: Every morning | ORAL | 0 refills | Status: DC

## 2017-12-10 NOTE — Telephone Encounter (Signed)
Medication sent to [facility].  

## 2017-12-10 NOTE — Telephone Encounter (Signed)
Patient called because she accidentally threw away her fluid pills when she was cleaning the house. She uses the [facility] [address].

## 2018-03-01 ENCOUNTER — Encounter: Payer: Self-pay | Admitting: Family Medicine

## 2018-03-01 ENCOUNTER — Ambulatory Visit (INDEPENDENT_AMBULATORY_CARE_PROVIDER_SITE_OTHER): Payer: Medicaid Other | Admitting: Family Medicine

## 2018-03-01 VITALS — BP 140/80 | HR 86 | Temp 98.4°F | Resp 16 | Ht 68.0 in | Wt 220.0 lb

## 2018-03-01 DIAGNOSIS — I10 Essential (primary) hypertension: Secondary | ICD-10-CM

## 2018-03-01 DIAGNOSIS — E118 Type 2 diabetes mellitus with unspecified complications: Secondary | ICD-10-CM

## 2018-03-01 LAB — POCT URINALYSIS DIP (DEVICE)
BILIRUBIN URINE: NEGATIVE
GLUCOSE, UA: NEGATIVE mg/dL
HGB URINE DIPSTICK: NEGATIVE
Ketones, ur: NEGATIVE mg/dL
NITRITE: NEGATIVE
Protein, ur: NEGATIVE mg/dL
Specific Gravity, Urine: 1.02 (ref 1.005–1.030)
UROBILINOGEN UA: 0.2 mg/dL (ref 0.0–1.0)
pH: 8.5 — ABNORMAL HIGH (ref 5.0–8.0)

## 2018-03-01 LAB — POCT GLYCOSYLATED HEMOGLOBIN (HGB A1C): HEMOGLOBIN A1C: 5.8

## 2018-03-01 MED ORDER — AMLODIPINE BESYLATE 10 MG PO TABS: 10.0000 mg | ORAL_TABLET | Freq: Every day | ORAL | 3 refills | Status: DC

## 2018-03-01 NOTE — Progress Notes (Signed)
Patient ID: Candace Diaz, female    DOB: June 06, 1956, 62 y.o.   MRN: [id number]  PCP: Bing Neighbors, FNP  Chief Complaint  Patient presents with  . Follow-up    3 month on chronic condition  . Medication Refill    amlodipine    Subjective:  HPI Candace Diaz is a 62 y.o. female with a history of hypertension and diabetes presents for evaluation of chronic conditions. Anecia reports no home monitoring of blood pressure. She consistently monitors her blood sugar. Last A1C 6.4. She is managed on long-acting insulin and Metformin. She reports routine physical activity-walking, but wishes to be more active and is considering a gym membership. Reports adherence all medications and requests a refill of Amlodipine. She has an eye exam scheduled. Denies urinary frequency or neuropathic pain. Reports efforts to adhere to low sodium diet. She is a nonsmoker. Denies any episodes of dizziness,headaches, shortness of breath, or chest pain. Denies any other complaints today. Social History   Socioeconomic History  . Marital status: Single    Spouse name: Not on file  . Number of children: Not on file  . Years of education: Not on file  . Highest education level: Not on file  Social Needs  . Financial resource strain: Not on file  . Food insecurity - worry: Not on file  . Food insecurity - inability: Not on file  . Transportation needs - medical: Not on file  . Transportation needs - non-medical: Not on file  Occupational History  . Not on file  Tobacco Use  . Smoking status: Never Smoker  . Smokeless tobacco: Never Used  Substance and Sexual Activity  . Alcohol use: No  . Drug use: No  . Sexual activity: Not on file  Other Topics Concern  . Not on file  Social History Narrative  . Not on file    Family History  Problem Relation Age of Onset  . Diabetes Brother   . Hypertension Brother   . Diabetes Other   . Hypertension Other    Review of Systems Constitutional: Negative for  fever, chills, diaphoresis, activity change, appetite change and fatigue. HENT: Negative for ear pain, nosebleeds, congestion, facial swelling, rhinorrhea, neck pain, neck stiffness and ear discharge.  Eyes: Negative for pain, discharge, redness, itching and visual disturbance. Respiratory: Negative for cough, choking, chest tightness, shortness of breath, wheezing and stridor.  Cardiovascular: Negative for chest pain, palpitations and leg swelling. Gastrointestinal: Negative for abdominal distention. Genitourinary: Negative for dysuria, urgency, frequency, hematuria, flank pain, decreased urine volume, difficulty urinating and dyspareunia.  Musculoskeletal: Negative for back pain, joint swelling, arthralgia and gait problem. Neurological: Negative for dizziness, tremors, seizures, syncope, facial asymmetry, speech difficulty, weakness, light-headedness, numbness and headaches.  Hematological: Negative for adenopathy. Does not bruise/bleed easily. Psychiatric/Behavioral: Negative for hallucinations, behavioral problems, confusion, dysphoric mood, decreased concentration and agitation.   Patient Active Problem List   Diagnosis Date Noted  . HTN (hypertension), malignant 12/03/2014  . DOE (dyspnea on exertion) 12/03/2014  . Acute serous otitis media 08/20/2014  . Essential hypertension, malignant 08/20/2014  . Other and unspecified hyperlipidemia 08/20/2014  . T2DM (type 2 diabetes mellitus) (HCC) 08/20/2014  . Immunization due 08/20/2014  . Need for Tdap vaccination 08/20/2014  . History of palpitations 08/20/2014  . History of CVA with residual deficit 08/20/2014  . Arthritis of right knee 08/20/2014    Allergies  Allergen Reactions  . Aspirin Other (See Comments)    Reaction uknown  . Penicillins Other (  See Comments)    Reaction unknown    Prior to Admission medications   Medication Sig Start Date End Date Taking? Authorizing Provider  ACCU-CHEK AVIVA PLUS test strip USE TO  CHECK BLOOD SUGAR TWICE DAILY 09/20/16  Yes Henrietta Hoover, NP  amLODipine (NORVASC) 10 MG tablet Take 1 tablet (10 mg total) daily by mouth. 11/05/17  Yes Bing Neighbors, FNP  EASY TOUCH INSULIN SYRINGE 31G X 5/16" 0.5 ML MISC USE TO INJECT INSULIN EVERY DAY 10/18/16  Yes Henrietta Hoover, NP  hydrochlorothiazide (HYDRODIURIL) 25 MG tablet Take 1 tablet (25 mg total) by mouth every morning. 12/10/17  Yes Bing Neighbors, FNP  Insulin Glargine (LANTUS SOLOSTAR) 100 UNIT/ML Solostar Pen Inject 10 Units into the skin daily at 10 pm. 11/30/17  Yes Bing Neighbors, FNP  Insulin Pen Needle (NOVOFINE) 30G X 8 MM MISC Administer insulin subcutaneously once daily. ICD 10 E.11 11/30/17  Yes Bing Neighbors, FNP  Lancets (ACCU-CHEK MULTICLIX) lancets USE TO CHECK BLOOD SUGAR TWICE DAILY 12/24/15  Yes Henrietta Hoover, NP  lisinopril (PRINIVIL,ZESTRIL) 20 MG tablet Take 1 tablet (20 mg total) by mouth daily. 11/30/17  Yes Bing Neighbors, FNP  metFORMIN (GLUCOPHAGE) 1000 MG tablet Take 1 tablet (1,000 mg total) by mouth 2 (two) times daily with a meal. 08/10/17  Yes Bing Neighbors, FNP  Multiple Vitamins-Minerals (MULTIVITAMIN WITH MINERALS) tablet Take 1 tablet by mouth daily.   Yes [provider]  PROAIR HFA 108 (90 Base) MCG/ACT inhaler INHALE 2 PUFFS BY MOUTH FOUR TIMES A DAY AS NEEDED FOR SHORTNESS OF BREATH. 04/11/17  Yes Massie Maroon, FNP  sitaGLIPtin (JANUVIA) 25 MG tablet Take 1 tablet (25 mg total) by mouth daily. 08/10/17  Yes Bing Neighbors, FNP  Lancets (ACCU-CHEK MULTICLIX) lancets USE TO CHECK BLOOD SUGAR TWICE DAILY 03/13/17   Massie Maroon, FNP  traMADol Janean Sark) 50 MG tablet Take 1 to 2 tablets twice a day as needed for pain Patient not taking: Reported on 08/03/2017 11/01/15   Ralene Cork, DO    Past Medical, Surgical Family and Social History reviewed and updated.    Objective:   Today's Vitals   03/01/18 1055  BP: 140/80  Pulse: 86   Resp: 16  Temp: 98.4 F (36.9 C)  TempSrc: Oral  SpO2: 100%  Weight: 220 lb (99.8 kg)  Height: 5\' 8"  (1.727 m)    Wt Readings from Last 3 Encounters:  03/01/18 220 lb (99.8 kg)  11/30/17 210 lb (95.3 kg)  08/31/17 218 lb (98.9 kg)    Physical Exam Constitutional: Patient appears well-developed and well-nourished. No distress. HENT: Normocephalic, atraumatic, External right and left ear normal. Oropharynx is clear and moist.  Eyes: Conjunctivae and EOM are normal. PERRLA, no scleral icterus. Neck: Normal ROM. Neck supple. No JVD. No tracheal deviation. No thyromegaly. CVS: RRR, S1/S2 +, no murmurs, no gallops, no carotid bruit.  Pulmonary: Effort and breath sounds normal, no stridor, rhonchi, wheezes, rales.  Abdominal: Soft. BS +, no distension, tenderness, rebound or guarding.  Musculoskeletal: Normal range of motion. No edema and no tenderness.  Neuro: Alert. Normal reflexes, muscle tone coordination.  Skin: Skin is warm and dry. No rash noted. Not diaphoretic. No erythema. No pallor. Psychiatric: Normal mood and affect. Behavior, judgment, thought content normal.   Diabetic Foot Exam - Simple   Simple Foot Form Diabetic Foot exam was performed with the following findings:  Yes 03/01/2018 11:06 AM  Visual Inspection  No deformities, no ulcerations, no other skin breakdown bilaterally:  Yes Sensation Testing Intact to touch and monofilament testing bilaterally:  Yes Pulse Check Posterior Tibialis and Dorsalis pulse intact bilaterally:  Yes Comments Patient has some fungus around her toenails.    Assessment & Plan:  1. Type 2 diabetes mellitus with complication, unspecified whether long term insulin use (HCC), A1C 5.8.  Diabetes has remained well controlled since resumed on Lantus. Previously trial discontinuing Lantus and A1c increased around 8.7.  We will not change medication regimen today we will continue as is.  Patient to return in 6 months for repeat A1c.   2. HTN  (hypertension), essential  We have discussed target BP range and blood pressure goal. I have advised patient to check BP regularly and to call us back or report to clinic if the numbers are consistently higher than 140/90. We discussed the importance of compliance with medical therapy and DASH diet recommended, consequences of uncontrolled hypertension discussed. Continue current BP medications. Checking a CMP today.   Meds ordered this encounter  Medications  . amLODipine (NORVASC) 10 MG tablet    Sig: Take 1 tablet (10 mg total) by mouth daily.    Dispense:  90 tablet    Refill:  3    Order Specific Question:   Supervising Provider    Answer:   Quentin AngstJEGEDE, OLUGBEMIGA E [[id number]]    RTC: 6 months for chronic condition management     Godfrey PickKimberly S. Tiburcio PeaHarris, MSN, FNP-C [facility]  [address] [place], [place] [address] [phone]

## 2018-03-01 NOTE — Patient Instructions (Addendum)
No medication changes today. I have refilled your amlodipine.

## 2018-03-02 LAB — COMPREHENSIVE METABOLIC PANEL
A/G RATIO: 1.3 (ref 1.2–2.2)
ALT: 40 IU/L — AB (ref 0–32)
AST: 38 IU/L (ref 0–40)
Albumin: 4.8 g/dL (ref 3.6–4.8)
Alkaline Phosphatase: 59 IU/L (ref 39–117)
BUN/Creatinine Ratio: 13 (ref 12–28)
BUN: 11 mg/dL (ref 8–27)
Bilirubin Total: 0.6 mg/dL (ref 0.0–1.2)
CALCIUM: 10 mg/dL (ref 8.7–10.3)
CO2: 28 mmol/L (ref 20–29)
CREATININE: 0.82 mg/dL (ref 0.57–1.00)
Chloride: 100 mmol/L (ref 96–106)
GFR calc Af Amer: 90 mL/min/{1.73_m2} (ref >59)
GFR, EST NON AFRICAN AMERICAN: 78 mL/min/{1.73_m2} (ref >59)
GLUCOSE: 77 mg/dL (ref 65–99)
Globulin, Total: 3.6 g/dL (ref 1.5–4.5)
Potassium: 3.8 mmol/L (ref 3.5–5.2)
Sodium: 143 mmol/L (ref 134–144)
TOTAL PROTEIN: 8.4 g/dL (ref 6.0–8.5)

## 2018-04-10 ENCOUNTER — Telehealth: Payer: Self-pay

## 2018-04-10 NOTE — Telephone Encounter (Signed)
Pharmacy states that patient has not been on a statin therapy and wants to know if patient should start on medication. If so they will need a new script sent to pharmacy.

## 2018-04-25 DIAGNOSIS — E119 Type 2 diabetes mellitus without complications: Secondary | ICD-10-CM | POA: Diagnosis not present

## 2018-04-25 DIAGNOSIS — H2512 Age-related nuclear cataract, left eye: Secondary | ICD-10-CM | POA: Diagnosis not present

## 2018-04-25 DIAGNOSIS — H34832 Tributary (branch) retinal vein occlusion, left eye, with macular edema: Secondary | ICD-10-CM | POA: Diagnosis not present

## 2018-05-11 ENCOUNTER — Other Ambulatory Visit: Payer: Self-pay | Admitting: Family Medicine

## 2018-06-03 ENCOUNTER — Ambulatory Visit (INDEPENDENT_AMBULATORY_CARE_PROVIDER_SITE_OTHER): Payer: Medicaid Other | Admitting: Family Medicine

## 2018-06-03 ENCOUNTER — Encounter: Payer: Self-pay | Admitting: Family Medicine

## 2018-06-03 VITALS — BP 126/92 | HR 82 | Temp 98.0°F | Ht 68.0 in | Wt 217.0 lb

## 2018-06-03 DIAGNOSIS — R0600 Dyspnea, unspecified: Secondary | ICD-10-CM

## 2018-06-03 DIAGNOSIS — R0609 Other forms of dyspnea: Secondary | ICD-10-CM | POA: Diagnosis not present

## 2018-06-03 DIAGNOSIS — I10 Essential (primary) hypertension: Secondary | ICD-10-CM | POA: Diagnosis not present

## 2018-06-03 DIAGNOSIS — E118 Type 2 diabetes mellitus with unspecified complications: Secondary | ICD-10-CM | POA: Diagnosis not present

## 2018-06-03 DIAGNOSIS — Z09 Encounter for follow-up examination after completed treatment for conditions other than malignant neoplasm: Secondary | ICD-10-CM

## 2018-06-03 LAB — POCT URINALYSIS DIP (MANUAL ENTRY)
Bilirubin, UA: NEGATIVE
Blood, UA: NEGATIVE
Glucose, UA: NEGATIVE mg/dL
Ketones, POC UA: NEGATIVE mg/dL
Nitrite, UA: NEGATIVE
Spec Grav, UA: 1.015 (ref 1.010–1.025)
Urobilinogen, UA: 1 E.U./dL
pH, UA: 7 (ref 5.0–8.0)

## 2018-06-03 LAB — POCT GLYCOSYLATED HEMOGLOBIN (HGB A1C): Hemoglobin A1C: 5.5 % (ref 4.0–5.6)

## 2018-06-03 MED ORDER — ALBUTEROL SULFATE HFA 108 (90 BASE) MCG/ACT IN AERS
INHALATION_SPRAY | RESPIRATORY_TRACT | 3 refills | Status: DC
Start: 2018-06-03 — End: 2019-02-24

## 2018-06-03 NOTE — Progress Notes (Signed)
Subjective:    Patient ID: Candace Diaz, female    DOB: [birth year], 62 y.o.   MRN: [id number]   PCP: Candace IpNatalie Evolett Somarriba, NP  Chief Complaint  Patient presents with  . Follow-up    chronic condition     HPI  Candace Diaz has a history of Hypertension and Diabetes.  She is here for follow up of chronic diseases.   Current Status: Since her last office visit, she has had right eye cataract removal she has an appointment with Optometry 07/03/2018 for cataract removal of her left eye. Dr. Dione BoozeGroat sent her to an eye specialist because she began to have left eye swelling. She denies falls, dizziness, and unsteadiness. She has occasionally headaches, which she rt her sinus problems.   She denies fevers, chills, recent infections, fatigue, weight loss, and night sweats.   She reports shortness of breath on exertion. Denies chest pain, cough, and heart palpitations.   She has a good appetite. She has normal bowel movements. Denies abdominal pain, nausea, vomiting, diarrhea, and constipation. She denies blood in stools, hematuria, and dysuria. Denies any other bleeding episodes. She denies neuropathy.   Denies anxiety and depression.   She has occasional back pain.   Past Medical History:  Diagnosis Date  . Diabetes mellitus   . Hypertension     Family History  Problem Relation Age of Onset  . Diabetes Brother   . Hypertension Brother   . Diabetes Other   . Hypertension Other     Social History   Socioeconomic History  . Marital status: Single    Spouse name: Not on file  . Number of children: Not on file  . Years of education: Not on file  . Highest education level: Not on file  Occupational History  . Not on file  Social Needs  . Financial resource strain: Not on file  . Food insecurity:    Worry: Not on file    Inability: Not on file  . Transportation needs:    Medical: Not on file    Non-medical: Not on file  Tobacco Use  . Smoking status: Never Smoker  . Smokeless  tobacco: Never Used  Substance and Sexual Activity  . Alcohol use: No  . Drug use: No  . Sexual activity: Not on file  Lifestyle  . Physical activity:    Days per week: Not on file    Minutes per session: Not on file  . Stress: Not on file  Relationships  . Social connections:    Talks on phone: Not on file    Gets together: Not on file    Attends religious service: Not on file    Active member of club or organization: Not on file    Attends meetings of clubs or organizations: Not on file    Relationship status: Not on file  . Intimate partner violence:    Fear of current or ex partner: Not on file    Emotionally abused: Not on file    Physically abused: Not on file    Forced sexual activity: Not on file  Other Topics Concern  . Not on file  Social History Narrative  . Not on file    Past Surgical History:  Procedure Laterality Date  . BREAST BIOPSY Left unsure    Immunization History  Administered Date(s) Administered  . Influenza,inj,Quad PF,6+ Mos 12/03/2014, 09/09/2015, 10/30/2016, 08/31/2017  . Pneumococcal Conjugate-13 01/24/2016  . Pneumococcal Polysaccharide-23 08/14/2014  . Tdap 08/14/2014  Current Meds  Medication Sig  . ACCU-CHEK AVIVA PLUS test strip USE TO CHECK BLOOD SUGAR TWICE DAILY  . albuterol (PROAIR HFA) 108 (90 Base) MCG/ACT inhaler INHALE 2 PUFFS BY MOUTH FOUR TIMES A DAY AS NEEDED FOR SHORTNESS OF BREATH.  Marland Kitchen amLODipine (NORVASC) 10 MG tablet Take 1 tablet (10 mg total) by mouth daily.  Marland Kitchen EASY TOUCH INSULIN SYRINGE 31G X 5/16" 0.5 ML MISC USE TO INJECT INSULIN EVERY DAY  . hydrochlorothiazide (HYDRODIURIL) 25 MG tablet Take 1 tablet (25 mg total) by mouth every morning.  . Insulin Glargine (LANTUS SOLOSTAR) 100 UNIT/ML Solostar Pen Inject 10 Units into the skin daily at 10 pm.  . Insulin Pen Needle (NOVOFINE) 30G X 8 MM MISC Administer insulin subcutaneously once daily. ICD 10 E.11  . Lancets (ACCU-CHEK MULTICLIX) lancets USE TO CHECK BLOOD  SUGAR TWICE DAILY  . Lancets (ACCU-CHEK MULTICLIX) lancets USE TO CHECK BLOOD SUGAR TWICE DAILY  . lisinopril (PRINIVIL,ZESTRIL) 20 MG tablet Take 1 tablet (20 mg total) by mouth daily.  . metFORMIN (GLUCOPHAGE) 1000 MG tablet TAKE 1 TABLET BY MOUTH TWICE DAILY WITH A MEAL  . Multiple Vitamins-Minerals (MULTIVITAMIN WITH MINERALS) tablet Take 1 tablet by mouth daily.  . sitaGLIPtin (JANUVIA) 25 MG tablet Take 1 tablet (25 mg total) by mouth daily.  . [DISCONTINUED] PROAIR HFA 108 (90 Base) MCG/ACT inhaler INHALE 2 PUFFS BY MOUTH FOUR TIMES A DAY AS NEEDED FOR SHORTNESS OF BREATH.    Allergies  Allergen Reactions  . Aspirin Other (See Comments)    Reaction uknown  . Penicillins Other (See Comments)    Reaction unknown    BP (!) 126/92 (BP Location: Left Arm, Patient Position: Sitting, Cuff Size: Large)   Pulse 82   Temp 98 F (36.7 C) (Oral)   Ht 5\' 8"  (1.727 m)   Wt 217 lb (98.4 kg)   SpO2 96%   BMI 32.99 kg/m     Review of Systems  Constitutional: Negative.   HENT: Negative.   Eyes: Negative.   Respiratory: Positive for shortness of breath (occasional).   Cardiovascular: Negative.   Gastrointestinal: Negative.   Endocrine: Negative.   Genitourinary: Negative.   Musculoskeletal: Negative.   Skin: Negative.   Allergic/Immunologic: Negative.   Neurological: Negative.   Hematological: Negative.   Psychiatric/Behavioral: Negative.    Objective:   Physical Exam  Constitutional: She is oriented to person, place, and time. She appears well-developed and well-nourished.  HENT:  Head: Normocephalic and atraumatic.  Right Ear: External ear normal.  Left Ear: External ear normal.  Nose: Nose normal.  Mouth/Throat: Oropharynx is clear and moist.  Eyes: Pupils are equal, round, and reactive to light. Conjunctivae and EOM are normal.  Neck: Normal range of motion. Neck supple.  Cardiovascular: Normal rate, regular rhythm, normal heart sounds and intact distal pulses.   Pulmonary/Chest: Effort normal and breath sounds normal.  Abdominal: Soft. Bowel sounds are normal.  Musculoskeletal: Normal range of motion.  Neurological: She is alert and oriented to person, place, and time.  Skin: Skin is warm and dry. Capillary refill takes less than 2 seconds.  Psychiatric: She has a normal mood and affect. Her behavior is normal. Judgment and thought content normal.  Nursing note and vitals reviewed.  Assessment & Plan:   1. Type 2 diabetes mellitus with complication, unspecified whether long term insulin use (HCC) Hgb A1c is normal at 5.5 today. Continue Metformin, Januvia, and Insulin as directed.  - POCT glycosylated hemoglobin (Hb A1C) - POCT  urinalysis dipstick  2. DOE (dyspnea on exertion) - albuterol (PROAIR HFA) 108 (90 Base) MCG/ACT inhaler; INHALE 2 PUFFS BY MOUTH FOUR TIMES A DAY AS NEEDED FOR SHORTNESS OF BREATH.  Dispense: 8.5 each; Refill: 3  3. Hypertension Blood pressure is 126/92 today. She will continue Norvasc, Lisinopril, and HCTZ.   4. Follow up She will follow un in 3 months.   Meds ordered this encounter  Medications  . albuterol (PROAIR HFA) 108 (90 Base) MCG/ACT inhaler    Sig: INHALE 2 PUFFS BY MOUTH FOUR TIMES A DAY AS NEEDED FOR SHORTNESS OF BREATH.    Dispense:  8.5 each    Refill:  3    Candace Ip,  MSN, FNP-BC Patient [facility] [address] [place], [place] [address] [phone]

## 2018-06-05 ENCOUNTER — Telehealth: Payer: Self-pay

## 2018-06-05 NOTE — Telephone Encounter (Signed)
Patient notified

## 2018-06-05 NOTE — Telephone Encounter (Signed)
-----   Message from Natalie M Stroud, FNP sent at 06/04/2018 10:54 PM EDT ----- Regarding: "Lab Results" Carrie,   Please call patient and let her know that her Hgb A1c is stable at 5.4 today.   She should continue Diabetic medications as directed. She should continue to eat a health diet low in fats and continue to get at least 30 minutes of cardio exercise daily.   Follow up in 3 months.  

## 2018-06-05 NOTE — Telephone Encounter (Signed)
-----   Message from Kallie LocksNatalie M Stroud, FNP sent at 06/04/2018 10:54 PM EDT ----- Regarding: "Lab Results" Lyla Sonarrie,   Please call patient and let her know that her Hgb A1c is stable at 5.4 today.   She should continue Diabetic medications as directed. She should continue to eat a health diet low in fats and continue to get at least 30 minutes of cardio exercise daily.   Follow up in 3 months.

## 2018-06-05 NOTE — Telephone Encounter (Signed)
Tried to contact patient no answer and no vm 

## 2018-06-12 ENCOUNTER — Other Ambulatory Visit: Payer: Self-pay | Admitting: Family Medicine

## 2018-06-12 DIAGNOSIS — Z1231 Encounter for screening mammogram for malignant neoplasm of breast: Secondary | ICD-10-CM

## 2018-07-01 DIAGNOSIS — E119 Type 2 diabetes mellitus without complications: Secondary | ICD-10-CM | POA: Diagnosis not present

## 2018-07-01 DIAGNOSIS — H2512 Age-related nuclear cataract, left eye: Secondary | ICD-10-CM | POA: Diagnosis not present

## 2018-07-01 DIAGNOSIS — H35043 Retinal micro-aneurysms, unspecified, bilateral: Secondary | ICD-10-CM | POA: Diagnosis not present

## 2018-07-01 DIAGNOSIS — H34832 Tributary (branch) retinal vein occlusion, left eye, with macular edema: Secondary | ICD-10-CM | POA: Diagnosis not present

## 2018-07-22 ENCOUNTER — Ambulatory Visit
Admission: RE | Admit: 2018-07-22 | Discharge: 2018-07-22 | Disposition: A | Discharge status: Home / Self Care | Payer: Medicaid Other | Source: Ambulatory Visit | Attending: Family Medicine | Admitting: Family Medicine

## 2018-07-22 DIAGNOSIS — Z1231 Encounter for screening mammogram for malignant neoplasm of breast: Secondary | ICD-10-CM | POA: Diagnosis not present

## 2018-08-21 ENCOUNTER — Other Ambulatory Visit: Payer: Self-pay | Admitting: Family Medicine

## 2018-09-03 ENCOUNTER — Other Ambulatory Visit: Payer: Self-pay | Admitting: Family Medicine

## 2018-09-06 ENCOUNTER — Ambulatory Visit: Payer: Medicaid Other | Admitting: Family Medicine

## 2018-09-09 ENCOUNTER — Ambulatory Visit: Payer: Medicaid Other | Admitting: Family Medicine

## 2018-09-09 ENCOUNTER — Encounter: Payer: Self-pay | Admitting: Family Medicine

## 2018-09-09 VITALS — BP 126/84 | HR 84 | Temp 98.4°F | Ht 68.0 in | Wt 215.0 lb

## 2018-09-09 DIAGNOSIS — Z23 Encounter for immunization: Secondary | ICD-10-CM

## 2018-09-09 DIAGNOSIS — N390 Urinary tract infection, site not specified: Secondary | ICD-10-CM

## 2018-09-09 DIAGNOSIS — E6609 Other obesity due to excess calories: Secondary | ICD-10-CM

## 2018-09-09 DIAGNOSIS — M25461 Effusion, right knee: Secondary | ICD-10-CM | POA: Diagnosis not present

## 2018-09-09 DIAGNOSIS — R300 Dysuria: Secondary | ICD-10-CM

## 2018-09-09 DIAGNOSIS — G8929 Other chronic pain: Secondary | ICD-10-CM

## 2018-09-09 DIAGNOSIS — R8290 Unspecified abnormal findings in urine: Secondary | ICD-10-CM | POA: Diagnosis not present

## 2018-09-09 DIAGNOSIS — R319 Hematuria, unspecified: Secondary | ICD-10-CM | POA: Diagnosis not present

## 2018-09-09 DIAGNOSIS — M25561 Pain in right knee: Secondary | ICD-10-CM

## 2018-09-09 DIAGNOSIS — Z6832 Body mass index (BMI) 32.0-32.9, adult: Secondary | ICD-10-CM | POA: Diagnosis not present

## 2018-09-09 DIAGNOSIS — Z09 Encounter for follow-up examination after completed treatment for conditions other than malignant neoplasm: Secondary | ICD-10-CM | POA: Diagnosis not present

## 2018-09-09 LAB — POCT URINALYSIS DIP (MANUAL ENTRY)
Bilirubin, UA: NEGATIVE
Glucose, UA: NEGATIVE mg/dL
Nitrite, UA: NEGATIVE
Protein Ur, POC: 30 mg/dL — AB
Spec Grav, UA: 1.02 (ref 1.010–1.025)
Urobilinogen, UA: 1 E.U./dL
pH, UA: 6 (ref 5.0–8.0)

## 2018-09-09 MED ORDER — SULFAMETHOXAZOLE-TRIMETHOPRIM 800-160 MG PO TABS: 1.0000 | ORAL_TABLET | Freq: Two times a day (BID) | ORAL | 0 refills | Status: DC

## 2018-09-09 NOTE — Progress Notes (Signed)
Follow Up  Subjective:    Patient ID: Candace Diaz, female    DOB: Apr 04, 1956, 62 y.o.   MRN: [id number]   Chief Complaint  Patient presents with  . Follow-up    Chronic condition   HPI  Candace Diaz is a 62 year old female with a past medical history of Hypertension and Diabetes. She is here today for follow up.   Current Status: Since her last office visit, she continues to have right knee pain and swelling. She believes that she needs fluid drained of right knee. She is taking all medications as prescribed. She has no other complaints today.   She denies fevers, chills, fatigue, recent infections, weight loss, and night sweats. She has not had any headaches, visual changes, dizziness, and falls. No chest pain, heart palpitations, cough and shortness of breath reported. No reports of GI problems such as nausea, vomiting, diarrhea, and constipation. She has no reports of blood in stools, dysuria and hematuria. No depression or anxiety, and denies suicidal ideations, homicidal ideations, or auditory hallucinations. She denies pain today.   Past Medical History:  Diagnosis Date  . Diabetes mellitus   . Hypertension     Family History  Problem Relation Age of Onset  . Diabetes Brother   . Hypertension Brother   . Diabetes Other   . Hypertension Other     Social History   Socioeconomic History  . Marital status: Single    Spouse name: Not on file  . Number of children: Not on file  . Years of education: Not on file  . Highest education level: Not on file  Occupational History  . Not on file  Social Needs  . Financial resource strain: Not on file  . Food insecurity:    Worry: Not on file    Inability: Not on file  . Transportation needs:    Medical: Not on file    Non-medical: Not on file  Tobacco Use  . Smoking status: Never Smoker  . Smokeless tobacco: Never Used  Substance and Sexual Activity  . Alcohol use: No  . Drug use: No  . Sexual activity: Not on file   Lifestyle  . Physical activity:    Days per week: Not on file    Minutes per session: Not on file  . Stress: Not on file  Relationships  . Social connections:    Talks on phone: Not on file    Gets together: Not on file    Attends religious service: Not on file    Active member of club or organization: Not on file    Attends meetings of clubs or organizations: Not on file    Relationship status: Not on file  . Intimate partner violence:    Fear of current or ex partner: Not on file    Emotionally abused: Not on file    Physically abused: Not on file    Forced sexual activity: Not on file  Other Topics Concern  . Not on file  Social History Narrative  . Not on file    Past Surgical History:  Procedure Laterality Date  . BREAST BIOPSY Left unsure    Immunization History  Administered Date(s) Administered  . Influenza,inj,Quad PF,6+ Mos 12/03/2014, 09/09/2015, 10/30/2016, 08/31/2017, 09/09/2018  . Pneumococcal Conjugate-13 01/24/2016  . Pneumococcal Polysaccharide-23 08/14/2014  . Tdap 08/14/2014    Current Meds  Medication Sig  . ACCU-CHEK AVIVA PLUS test strip USE TO CHECK BLOOD SUGAR TWICE DAILY  . acetaminophen (TYLENOL)  325 MG tablet Take 650 mg by mouth every 6 (six) hours as needed.  Marland Kitchen albuterol (PROAIR HFA) 108 (90 Base) MCG/ACT inhaler INHALE 2 PUFFS BY MOUTH FOUR TIMES A DAY AS NEEDED FOR SHORTNESS OF BREATH.  Marland Kitchen amLODipine (NORVASC) 10 MG tablet Take 1 tablet (10 mg total) by mouth daily.  Marland Kitchen EASY TOUCH INSULIN SYRINGE 31G X 5/16" 0.5 ML MISC USE TO INJECT INSULIN EVERY DAY  . hydrochlorothiazide (HYDRODIURIL) 25 MG tablet Take 1 tablet (25 mg total) by mouth every morning.  . Insulin Glargine (LANTUS SOLOSTAR) 100 UNIT/ML Solostar Pen Inject 10 Units into the skin daily at 10 pm.  . Insulin Pen Needle (NOVOFINE) 30G X 8 MM MISC Administer insulin subcutaneously once daily. ICD 10 E.11  . JANUVIA 25 MG tablet TAKE 1 TABLET BY MOUTH ONCE DAILY  . Lancets  (ACCU-CHEK MULTICLIX) lancets USE TO CHECK BLOOD SUGAR TWICE DAILY  . Lancets (ACCU-CHEK MULTICLIX) lancets USE TO CHECK BLOOD SUGAR TWICE DAILY  . lisinopril (PRINIVIL,ZESTRIL) 20 MG tablet TAKE 1 TABLET BY MOUTH ONCE DAILY  . metFORMIN (GLUCOPHAGE) 1000 MG tablet TAKE 1 TABLET BY MOUTH TWICE DAILY WITH A MEAL  . Multiple Vitamins-Minerals (MULTIVITAMIN WITH MINERALS) tablet Take 1 tablet by mouth daily.    Allergies  Allergen Reactions  . Aspirin Other (See Comments)    Reaction uknown  . Penicillins Other (See Comments)    Reaction unknown    BP 126/84 (BP Location: Left Arm, Patient Position: Sitting, Cuff Size: Large)   Pulse 84   Temp 98.4 F (36.9 C)   Ht 5\' 8"  (1.727 m)   Wt 215 lb (97.5 kg)   SpO2 98%   BMI 32.69 kg/m   Review of Systems  HENT: Negative.   Respiratory: Negative.   Cardiovascular: Negative.   Gastrointestinal: Positive for abdominal distention (Obese).  Genitourinary: Negative.   Musculoskeletal: Positive for arthralgias (right knee pain) and joint swelling (right knee pain, swelling).  Skin: Negative.   Neurological: Positive for dizziness (Occasional ).  Hematological: Negative.   Psychiatric/Behavioral: Negative.    Objective:   Physical Exam  Constitutional: She appears well-developed and well-nourished.  Neck: Normal range of motion. Neck supple.  Cardiovascular: Normal rate, regular rhythm, normal heart sounds and intact distal pulses.  Pulmonary/Chest: Effort normal and breath sounds normal.  Abdominal: Soft. Bowel sounds are normal.  Musculoskeletal: Normal range of motion.  Skin: Skin is warm and dry. Capillary refill takes less than 2 seconds.  Psychiatric: She has a normal mood and affect. Her behavior is normal. Judgment and thought content normal.  Nursing note and vitals reviewed.  Assessment & Plan:   1. Chronic pain of right knee  - AMB referral to orthopedics  2. Fluid in right knee joint Referral to Orthopedics for  Arthrocentesis. - AMB referral to orthopedics  3. Class 1 obesity due to excess calories with serious comorbidity and body mass index (BMI) of 32.0 to 32.9 in adult Her goal BMI is <25. Encouraged efforts to reduce weight include engaging in physical activity as tolerated with goal of 150 minutes per week. Improve dietary choices and eat a meal regimen consistent with a Mediterranean or DASH diet. Reduce simple carbohydrates. Do not skip meals and eat healthy snacks throughout the day to avoid over-eating at dinner. Set a goal weight loss that is achievable for you.  4. Dysuria UTI.   5. Need for immunization against influenza - Flu Vaccine QUAD 36+ mos IM  6. Urinary tract infection with  hematuria, site unspecified - sulfamethoxazole-trimethoprim (BACTRIM DS,SEPTRA DS) 800-160 MG tablet; Take 1 tablet by mouth 2 (two) times daily.  Dispense: 20 tablet; Refill: 0  7. Abnormal urinalysis - Urine Culture  8. Follow up She will follow up in 3 months.  - POCT urinalysis dipstick   Meds ordered this encounter  Medications  . sulfamethoxazole-trimethoprim (BACTRIM DS,SEPTRA DS) 800-160 MG tablet    Sig: Take 1 tablet by mouth 2 (two) times daily.    Dispense:  20 tablet    Refill:  0    Raliegh IpNatalie Adrianah Prophete,  MSN, FNP-C Patient [facility] [address]  [place], [place] [id number] [phone]

## 2018-09-09 NOTE — Patient Instructions (Signed)
Acetaminophen tablets or caplets °What is this medicine? °ACETAMINOPHEN (a set a MEE noe fen) is a pain reliever. It is used to treat mild pain and fever. °This medicine may be used for other purposes; ask your health care provider or pharmacist if you have questions. °COMMON BRAND NAME(S): Aceta, Actamin, Anacin Aspirin Free, Genapap, Genebs, Mapap, Pain & Fever, Pain and Fever, PAIN RELIEF, PAIN RELIEF Extra Strength, Pain Reliever, Panadol, PHARBETOL, Q-Pap, Q-Pap Extra Strength, Tylenol, Tylenol CrushableTablet, Tylenol Extra Strength, XS No Aspirin, XS Pain Reliever °What should I tell my health care provider before I take this medicine? °They need to know if you have any of these conditions: °-if you often drink alcohol °-liver disease °-an unusual or allergic reaction to acetaminophen, other medicines, foods, dyes, or preservatives °-pregnant or trying to get pregnant °-breast-feeding °How should I use this medicine? °Take this medicine by mouth with a glass of water. Follow the directions on the package or prescription label. Take your medicine at regular intervals. Do not take your medicine more often than directed. °Talk to your pediatrician regarding the use of this medicine in children. While this drug may be prescribed for children as young as 6 years of age for selected conditions, precautions do apply. °Overdosage: If you think you have taken too much of this medicine contact a poison control center or emergency room at once. °NOTE: This medicine is only for you. Do not share this medicine with others. °What if I miss a dose? °If you miss a dose, take it as soon as you can. If it is almost time for your next dose, take only that dose. Do not take double or extra doses. °What may interact with this medicine? °-alcohol °-imatinib °-isoniazid °-other medicines with acetaminophen °This list may not describe all possible interactions. Give your health care provider a list of all the medicines, herbs,  non-prescription drugs, or dietary supplements you use. Also tell them if you smoke, drink alcohol, or use illegal drugs. Some items may interact with your medicine. °What should I watch for while using this medicine? °Tell your doctor or health care professional if the pain lasts more than 10 days (5 days for children), if it gets worse, or if there is a new or different kind of pain. Also, check with your doctor if a fever lasts for more than 3 days. °Do not take other medicines that contain acetaminophen with this medicine. Always read labels carefully. If you have questions, ask your doctor or pharmacist. °If you take too much acetaminophen get medical help right away. Too much acetaminophen can be very dangerous and cause liver damage. Even if you do not have symptoms, it is important to get help right away. °What side effects may I notice from receiving this medicine? °Side effects that you should report to your doctor or health care professional as soon as possible: °-allergic reactions like skin rash, itching or hives, swelling of the face, lips, or tongue °-breathing problems °-fever or sore throat °-redness, blistering, peeling or loosening of the skin, including inside the mouth °-trouble passing urine or change in the amount of urine °-unusual bleeding or bruising °-unusually weak or tired °-yellowing of the eyes or skin °Side effects that usually do not require medical attention (report to your doctor or health care professional if they continue or are bothersome): °-headache °-nausea, stomach upset °This list may not describe all possible side effects. Call your doctor for medical advice about side effects. You may report side effects   to FDA at 1-800-FDA-1088. °Where should I keep my medicine? °Keep out of reach of children. °Store at room temperature between 20 and 25 degrees C (68 and 77 degrees F). Protect from moisture and heat. Throw away any unused medicine after the expiration date. °NOTE: This  sheet is a summary. It may not cover all possible information. If you have questions about this medicine, talk to your doctor, pharmacist, or health care provider. °© 2018 [employer] (2013-08-04 12:54:16) ° °

## 2018-09-11 LAB — URINE CULTURE

## 2018-09-20 ENCOUNTER — Encounter (INDEPENDENT_AMBULATORY_CARE_PROVIDER_SITE_OTHER): Payer: Self-pay | Admitting: Orthopaedic Surgery

## 2018-09-20 ENCOUNTER — Other Ambulatory Visit: Payer: Self-pay

## 2018-09-20 ENCOUNTER — Ambulatory Visit (INDEPENDENT_AMBULATORY_CARE_PROVIDER_SITE_OTHER): Payer: Medicaid Other | Admitting: Orthopaedic Surgery

## 2018-09-20 VITALS — BP 131/78 | HR 71 | Ht 68.0 in | Wt 215.0 lb

## 2018-09-20 DIAGNOSIS — M1711 Unilateral primary osteoarthritis, right knee: Secondary | ICD-10-CM

## 2018-09-20 MED ORDER — SITAGLIPTIN PHOSPHATE 25 MG PO TABS: 25.0000 mg | ORAL_TABLET | Freq: Every day | ORAL | 1 refills | Status: DC

## 2018-09-20 MED ORDER — METHYLPREDNISOLONE ACETATE 40 MG/ML IJ SUSP
80.0000 mg | INTRAMUSCULAR | Status: AC | PRN
  Administered 2018-09-20: 80 mg

## 2018-09-20 MED ORDER — BUPIVACAINE HCL 0.5 % IJ SOLN
2.0000 mL | INTRAMUSCULAR | Status: AC | PRN
  Administered 2018-09-20: 2 mL via INTRA_ARTICULAR

## 2018-09-20 MED ORDER — LIDOCAINE HCL 1 % IJ SOLN
2.0000 mL | INTRAMUSCULAR | Status: AC | PRN
  Administered 2018-09-20: 2 mL

## 2018-09-20 NOTE — Telephone Encounter (Signed)
Medication sent to pharmacy  

## 2018-09-20 NOTE — Progress Notes (Signed)
Office Visit Note   Patient: Candace Diaz           Date of Birth: 06-Sep-1956           MRN: [id number] Visit Date: 09/20/2018              Requested by: Kallie Locks, FNP [address] [place], [place] [address] PCP: Kallie Locks, FNP   Assessment & Plan: Visit Diagnoses:  1. Arthritis of right knee     Plan: Current pain and effusion related to the osteoarthritis right knee.  We will plan to aspirate and inject cortisone.  Follow-up as needed.  Patient is aware to check her blood glucose with potential elevation with the cortisone  Follow-Up Instructions: Return if symptoms worsen or fail to improve.   Orders:  Orders Placed This Encounter  Procedures  . Large Joint Inj: R knee   No orders of the defined types were placed in this encounter.     Procedures: Large Joint Inj: R knee on 09/20/2018 8:31 AM Indications: pain and diagnostic evaluation Details: 25 G 1.5 in needle, anteromedial approach  Arthrogram: No  Medications: 2 mL lidocaine 1 %; 2 mL bupivacaine 0.5 %; 80 mg methylPREDNISolone acetate 40 MG/ML Aspirate: 20 mL clear and yellow Outcome: tolerated well, no immediate complications Procedure, treatment alternatives, risks and benefits explained, specific risks discussed. Consent was given by the patient. Immediately prior to procedure a time out was called to verify the correct patient, procedure, equipment, support staff and site/side marked as required. Patient was prepped and draped in the usual sterile fashion.       Clinical Data: No additional findings.   Subjective: Chief Complaint  Patient presents with  . Follow-up    re current knee pain seen over 1 yr ago 05/21/17 knee x ray would like fluid drained off  Candace Diaz was seen several times last year for evaluation of right knee pain.  Films demonstrated end-stage osteoarthritis particularly in the lateral compartment where she had bone-on-bone and increased valgus.  Within the last  number of weeks she has had recurrent effusion and pain in her knee and wanted to have her knee "drained".  She recently was seen by her family physician as noted in the chart with no history of injury or trauma.  HPI  Review of Systems  Constitutional: Negative for fatigue and fever.  HENT: Negative for ear pain.   Eyes: Negative for pain.  Respiratory: Negative for cough and shortness of breath.   Cardiovascular: Negative for leg swelling.  Gastrointestinal: Negative for constipation and diarrhea.  Genitourinary: Negative for difficulty urinating.  Musculoskeletal: Negative for back pain and neck pain.  Skin: Negative for rash.  Allergic/Immunologic: Negative for food allergies.  Neurological: Negative for weakness and numbness.  Hematological: Does not bruise/bleed easily.  Psychiatric/Behavioral: Negative for suicidal ideas.     Objective: Vital Signs: BP 131/78 (BP Location: Left Arm, Patient Position: Sitting, Cuff Size: Normal)   Pulse 71   Ht 5\' 8"  (1.727 m)   Wt 215 lb (97.5 kg)   BMI 32.69 kg/m   Physical Exam  Constitutional: She is oriented to person, place, and time. She appears well-developed and well-nourished.  HENT:  Mouth/Throat: Oropharynx is clear and moist.  Eyes: Pupils are equal, round, and reactive to light. EOM are normal.  Pulmonary/Chest: Effort normal.  Neurological: She is alert and oriented to person, place, and time.  Skin: Skin is warm and dry.  Psychiatric: She has  a normal mood and affect. Her behavior is normal.    Ortho Exam awake alert and oriented x3.  Comfortable sitting.  Right knee with increased valgus with weightbearing.  Full extension.  Flexion over 100 degrees.  No instability.  Positive effusion.  Predominately pain along the lateral compartment.  Some fullness in the popliteal space.  No calf pain.  Specialty Comments:  No specialty comments available.  Imaging: No results found.   PMFS History: Patient Active Problem  List   Diagnosis Date Noted  . HTN (hypertension), malignant 12/03/2014  . DOE (dyspnea on exertion) 12/03/2014  . Acute serous otitis media 08/20/2014  . Essential hypertension, malignant 08/20/2014  . Other and unspecified hyperlipidemia 08/20/2014  . T2DM (type 2 diabetes mellitus) (HCC) 08/20/2014  . Immunization due 08/20/2014  . Need for Tdap vaccination 08/20/2014  . History of palpitations 08/20/2014  . History of CVA with residual deficit 08/20/2014  . Arthritis of right knee 08/20/2014   Past Medical History:  Diagnosis Date  . Diabetes mellitus   . Hypertension     Family History  Problem Relation Age of Onset  . Diabetes Brother   . Hypertension Brother   . Diabetes Other   . Hypertension Other     Past Surgical History:  Procedure Laterality Date  . BREAST BIOPSY Left unsure   Social History   Occupational History  . Not on file  Tobacco Use  . Smoking status: Never Smoker  . Smokeless tobacco: Never Used  Substance and Sexual Activity  . Alcohol use: No  . Drug use: No  . Sexual activity: Not on file

## 2018-12-09 ENCOUNTER — Ambulatory Visit (INDEPENDENT_AMBULATORY_CARE_PROVIDER_SITE_OTHER): Payer: Medicaid Other | Admitting: Family Medicine

## 2018-12-09 ENCOUNTER — Encounter: Payer: Self-pay | Admitting: Family Medicine

## 2018-12-09 VITALS — BP 132/84 | HR 80 | Temp 98.0°F | Ht 68.0 in | Wt 220.0 lb

## 2018-12-09 DIAGNOSIS — E118 Type 2 diabetes mellitus with unspecified complications: Secondary | ICD-10-CM

## 2018-12-09 DIAGNOSIS — M25461 Effusion, right knee: Secondary | ICD-10-CM | POA: Diagnosis not present

## 2018-12-09 DIAGNOSIS — G8929 Other chronic pain: Secondary | ICD-10-CM | POA: Diagnosis not present

## 2018-12-09 DIAGNOSIS — Z131 Encounter for screening for diabetes mellitus: Secondary | ICD-10-CM

## 2018-12-09 DIAGNOSIS — M25561 Pain in right knee: Secondary | ICD-10-CM | POA: Diagnosis not present

## 2018-12-09 DIAGNOSIS — Z09 Encounter for follow-up examination after completed treatment for conditions other than malignant neoplasm: Secondary | ICD-10-CM | POA: Diagnosis not present

## 2018-12-09 DIAGNOSIS — I10 Essential (primary) hypertension: Secondary | ICD-10-CM

## 2018-12-09 LAB — POCT URINALYSIS DIP (MANUAL ENTRY)
Bilirubin, UA: NEGATIVE
Glucose, UA: NEGATIVE mg/dL
Ketones, POC UA: NEGATIVE mg/dL
Nitrite, UA: NEGATIVE
Protein Ur, POC: NEGATIVE mg/dL
Spec Grav, UA: 1.01 (ref 1.010–1.025)
Urobilinogen, UA: 0.2 E.U./dL
pH, UA: 7 (ref 5.0–8.0)

## 2018-12-09 LAB — POCT GLYCOSYLATED HEMOGLOBIN (HGB A1C): Hemoglobin A1C: 6 % — AB (ref 4.0–5.6)

## 2018-12-09 MED ORDER — ATORVASTATIN CALCIUM 10 MG PO TABS: 10.0000 mg | ORAL_TABLET | Freq: Every day | ORAL | 6 refills | Status: DC

## 2018-12-09 NOTE — Progress Notes (Signed)
Follow Up  Subjective:    Patient ID: Candace Diaz, female    DOB: [birth year], 62 y.o.   MRN: [id number]  Chief Complaint  Patient presents with  . Follow-up    Chronic condition    HPI  Candace Diaz is a 62 year old female with a past medical history of Hypertension, Chronic Right Knee Pain, and Diabetes. She is here today for follow up.   Current Status: Since her last office visit, she is doing well with no complaints. She reports recent visit to Orthopedics for fluid removal from right knee, with great relief. Orthopedic recommended that she may have to undergo knee surgery in the future. She continues to have moderate pain in her right knee today. She will continue to decrease high sodium intake, excessive alcohol intake, increase potassium intake, smoking cessation, and increase physical activity of at least 30 minutes of cardio activity daily. She will continue to follow Heart Healthy or DASH diet. She denies fatigue, frequent urination, blurred vision, excessive hunger, excessive thirst, weight gain, weight loss, and poor wound healing.   She denies fevers, chills, recent infections, weight loss, and night sweats. No reports of GI problems such as diarrhea, and constipation. She has no reports of blood in stools, dysuria and hematuria. No depression or anxiety reported.   Review of Systems  Constitutional: Negative.   HENT: Negative.   Eyes: Negative.   Respiratory: Positive for cough and shortness of breath.   Cardiovascular: Negative.   Gastrointestinal: Negative.   Endocrine: Negative.   Genitourinary: Negative.   Musculoskeletal: Negative.   Skin: Negative.   Allergic/Immunologic: Negative.   Neurological: Positive for dizziness and headaches.  Hematological: Negative.   Psychiatric/Behavioral: Negative.    Objective:   Physical Exam Vitals signs and nursing note reviewed.  Constitutional:      Appearance: Normal appearance. She is normal weight.  HENT:     Head:  Normocephalic and atraumatic.     Right Ear: Tympanic membrane, ear canal and external ear normal.     Left Ear: Tympanic membrane, ear canal and external ear normal.     Nose: Nose normal.     Mouth/Throat:     Mouth: Mucous membranes are dry.     Pharynx: Oropharynx is clear.  Eyes:     Extraocular Movements: Extraocular movements intact.     Conjunctiva/sclera: Conjunctivae normal.     Pupils: Pupils are equal, round, and reactive to light.  Neck:     Musculoskeletal: Normal range of motion and neck supple.  Cardiovascular:     Rate and Rhythm: Normal rate and regular rhythm.     Pulses: Normal pulses.     Heart sounds: Normal heart sounds.  Pulmonary:     Effort: Pulmonary effort is normal.     Breath sounds: Normal breath sounds.  Abdominal:     General: Abdomen is flat. Bowel sounds are normal.     Palpations: Abdomen is soft.  Neurological:     Mental Status: She is alert.    Assessment & Plan:   1. HTN (hypertension), malignant Antihypertensive medications are effective. She will continue Lisinopril, HCTZ, and Amlodipine as prescribed. She will continue to decrease high sodium intake, excessive alcohol intake, increase potassium intake, smoking cessation, and increase physical activity of at least 30 minutes of cardio activity daily. She will continue to follow Heart Healthy or DASH diet.  2. Type 2 diabetes mellitus with complication, without long-term current use of insulin (HCC) Hgb A1c is mildly elevated  at 6.0 today, from 5.5 06/03/2018. She will continue Januvia and Metformin as prescribed. She will continue to decrease foods/beverages high in sugars and carbs and follow Heart Healthy or DASH diet. Increase physical activity to at least 30 minutes cardio exercise daily.   We will initiate Lipitor today, to decrease the risk of cardiovascular problems.  3. Screening for diabetes mellitus - POCT urinalysis dipstick - POCT glycosylated hemoglobin (Hb A1C)  4.  Chronic pain of right knee  5. Fluid in right knee joint Recent Arthrocentesis of her right knee, per Orthopedic. Possible future plans for right knee surgery.    6. Follow up She will follow up in 4 months.  No orders of the defined types were placed in this encounter.  Raliegh IpNatalie Marlynn Hinckley,  MSN, FNP-C Patient Care [facility] [address]  [place], [place] [id number] [phone]

## 2018-12-12 ENCOUNTER — Telehealth: Payer: Self-pay

## 2018-12-12 NOTE — Telephone Encounter (Signed)
Left a vm for patient to callback 

## 2018-12-12 NOTE — Telephone Encounter (Signed)
-----   Message from Natalie M Stroud, FNP sent at 12/09/2018 12:17 PM EST ----- Regarding: "New Rx" New Rx for Atorvastatin sent to pharmacy today. Please inform patient. Thank you.   

## 2018-12-19 NOTE — Telephone Encounter (Signed)
Patient states that the Atorvastatin makes her itch really bad and wants to know what she should do. Patient is aware that you are out of office until 12/30.

## 2018-12-19 NOTE — Telephone Encounter (Signed)
-----   Message from Kallie LocksNatalie M Stroud, FNP sent at 12/09/2018 12:17 PM EST ----- Regarding: "New Rx" New Rx for Atorvastatin sent to pharmacy today. Please inform patient. Thank you.

## 2018-12-23 ENCOUNTER — Other Ambulatory Visit: Payer: Self-pay | Admitting: Family Medicine

## 2018-12-23 DIAGNOSIS — E785 Hyperlipidemia, unspecified: Secondary | ICD-10-CM

## 2018-12-23 MED ORDER — SIMVASTATIN 10 MG PO TABS: 10.0000 mg | ORAL_TABLET | Freq: Every evening | ORAL | 3 refills | Status: DC

## 2018-12-24 NOTE — Telephone Encounter (Signed)
Left a vm for patient to callback 

## 2018-12-24 NOTE — Telephone Encounter (Signed)
Patient notified

## 2018-12-30 DIAGNOSIS — H2512 Age-related nuclear cataract, left eye: Secondary | ICD-10-CM | POA: Diagnosis not present

## 2018-12-30 DIAGNOSIS — E119 Type 2 diabetes mellitus without complications: Secondary | ICD-10-CM | POA: Diagnosis not present

## 2018-12-30 DIAGNOSIS — H35043 Retinal micro-aneurysms, unspecified, bilateral: Secondary | ICD-10-CM | POA: Diagnosis not present

## 2018-12-30 DIAGNOSIS — H34832 Tributary (branch) retinal vein occlusion, left eye, with macular edema: Secondary | ICD-10-CM | POA: Diagnosis not present

## 2019-01-02 ENCOUNTER — Other Ambulatory Visit: Payer: Self-pay

## 2019-01-02 DIAGNOSIS — E118 Type 2 diabetes mellitus with unspecified complications: Secondary | ICD-10-CM

## 2019-01-02 MED ORDER — INSULIN GLARGINE 100 UNIT/ML SOLOSTAR PEN: 10.0000 [IU] | PEN_INJECTOR | Freq: Every day | SUBCUTANEOUS | 8 refills | Status: DC

## 2019-01-02 NOTE — Telephone Encounter (Signed)
Medication sent to pharmacy  

## 2019-02-20 ENCOUNTER — Other Ambulatory Visit: Payer: Self-pay | Admitting: Internal Medicine

## 2019-02-24 ENCOUNTER — Telehealth: Payer: Self-pay

## 2019-02-24 DIAGNOSIS — R0609 Other forms of dyspnea: Principal | ICD-10-CM

## 2019-02-24 MED ORDER — METFORMIN HCL 1000 MG PO TABS: ORAL_TABLET | ORAL | 2 refills | Status: DC

## 2019-02-24 MED ORDER — ALBUTEROL SULFATE HFA 108 (90 BASE) MCG/ACT IN AERS: INHALATION_SPRAY | RESPIRATORY_TRACT | 3 refills | Status: DC

## 2019-02-24 NOTE — Telephone Encounter (Signed)
Medication sent to pharmacy  

## 2019-02-27 ENCOUNTER — Other Ambulatory Visit: Payer: Self-pay | Admitting: Family Medicine

## 2019-02-27 ENCOUNTER — Telehealth: Payer: Self-pay

## 2019-02-27 MED ORDER — HYDROCHLOROTHIAZIDE 25 MG PO TABS: 25.0000 mg | ORAL_TABLET | Freq: Every morning | ORAL | 1 refills | Status: DC

## 2019-02-27 NOTE — Telephone Encounter (Signed)
See refill request below for Raliegh Ip, NP.

## 2019-02-27 NOTE — Telephone Encounter (Signed)
Medication sent to pharmacy  

## 2019-03-18 ENCOUNTER — Telehealth: Payer: Self-pay

## 2019-03-18 MED ORDER — AMLODIPINE BESYLATE 10 MG PO TABS: 10.0000 mg | ORAL_TABLET | Freq: Every day | ORAL | 3 refills | Status: DC

## 2019-03-18 NOTE — Telephone Encounter (Signed)
Patient notified

## 2019-03-25 ENCOUNTER — Telehealth: Payer: Self-pay

## 2019-03-25 ENCOUNTER — Other Ambulatory Visit: Payer: Self-pay | Admitting: Family Medicine

## 2019-03-25 MED ORDER — SITAGLIPTIN PHOSPHATE 25 MG PO TABS: 25.0000 mg | ORAL_TABLET | Freq: Every day | ORAL | 1 refills | Status: DC

## 2019-03-25 NOTE — Telephone Encounter (Signed)
Medication has been sent to the pharmacy. 

## 2019-03-25 NOTE — Telephone Encounter (Signed)
cay 

## 2019-03-26 ENCOUNTER — Other Ambulatory Visit: Payer: Self-pay

## 2019-03-26 DIAGNOSIS — E118 Type 2 diabetes mellitus with unspecified complications: Secondary | ICD-10-CM

## 2019-03-26 MED ORDER — INSULIN PEN NEEDLE 30G X 8 MM MISC: 5 refills | Status: DC

## 2019-03-26 NOTE — Telephone Encounter (Signed)
Pen Needles have been sent to pharmacy

## 2019-04-11 ENCOUNTER — Other Ambulatory Visit: Payer: Self-pay

## 2019-04-11 ENCOUNTER — Other Ambulatory Visit (INDEPENDENT_AMBULATORY_CARE_PROVIDER_SITE_OTHER): Payer: Medicaid Other

## 2019-04-11 ENCOUNTER — Ambulatory Visit (INDEPENDENT_AMBULATORY_CARE_PROVIDER_SITE_OTHER): Payer: Medicaid Other | Admitting: Family Medicine

## 2019-04-11 DIAGNOSIS — E118 Type 2 diabetes mellitus with unspecified complications: Secondary | ICD-10-CM

## 2019-04-11 DIAGNOSIS — I10 Essential (primary) hypertension: Secondary | ICD-10-CM | POA: Diagnosis not present

## 2019-04-11 DIAGNOSIS — E785 Hyperlipidemia, unspecified: Secondary | ICD-10-CM

## 2019-04-11 DIAGNOSIS — Z0000 Encounter for general adult medical examination without abnormal findings: Secondary | ICD-10-CM | POA: Diagnosis not present

## 2019-04-11 DIAGNOSIS — M25561 Pain in right knee: Secondary | ICD-10-CM

## 2019-04-11 DIAGNOSIS — G8929 Other chronic pain: Secondary | ICD-10-CM

## 2019-04-11 DIAGNOSIS — R8290 Unspecified abnormal findings in urine: Secondary | ICD-10-CM

## 2019-04-11 DIAGNOSIS — Z09 Encounter for follow-up examination after completed treatment for conditions other than malignant neoplasm: Secondary | ICD-10-CM

## 2019-04-11 LAB — POCT URINALYSIS DIPSTICK
Bilirubin, UA: NEGATIVE
Glucose, UA: NEGATIVE
Nitrite, UA: NEGATIVE
Protein, UA: NEGATIVE
Spec Grav, UA: 1.02 (ref 1.010–1.025)
Urobilinogen, UA: 0.2 E.U./dL
pH, UA: 6 (ref 5.0–8.0)

## 2019-04-11 LAB — POCT GLYCOSYLATED HEMOGLOBIN (HGB A1C): Hemoglobin A1C: 6.2 % — AB (ref 4.0–5.6)

## 2019-04-11 NOTE — Progress Notes (Signed)
Virtual Visit via Telephone Note  I connected with Candace Diaz on 04/11/19 at 11:00 AM EDT by telephone and verified that I am speaking with the correct person using two identifiers.   I discussed the limitations, risks, security and privacy concerns of performing an evaluation and management service by telephone and the availability of in person appointments. I also discussed with the patient that there may be a patient responsible charge related to this service. The patient expressed understanding and agreed to proceed.   History of Present Illness:  Past Medical History:  Diagnosis Date  . Chronic pain of right knee   . Diabetes mellitus   . Hypertension     Current Outpatient Medications on File Prior to Visit  Medication Sig Dispense Refill  . ACCU-CHEK AVIVA PLUS test strip USE TO CHECK BLOOD SUGAR TWICE DAILY 50 each 3  . acetaminophen (TYLENOL) 325 MG tablet Take 650 mg by mouth every 6 (six) hours as needed.    Marland Kitchen. albuterol (PROAIR HFA) 108 (90 Base) MCG/ACT inhaler INHALE 2 PUFFS BY MOUTH FOUR TIMES A DAY AS NEEDED FOR SHORTNESS OF BREATH. 8.5 each 3  . amLODipine (NORVASC) 10 MG tablet Take 1 tablet (10 mg total) by mouth daily. 90 tablet 3  . atorvastatin (LIPITOR) 10 MG tablet Take 1 tablet (10 mg total) by mouth daily. 30 tablet 6  . EASY TOUCH INSULIN SYRINGE 31G X 5/16" 0.5 ML MISC USE TO INJECT INSULIN EVERY DAY 100 each 1  . hydrochlorothiazide (HYDRODIURIL) 25 MG tablet TAKE 1 TABLET BY MOUTH IN THE MORNING 90 tablet 0  . hydrochlorothiazide (HYDRODIURIL) 25 MG tablet Take 1 tablet (25 mg total) by mouth every morning. 270 tablet 1  . Insulin Glargine (LANTUS SOLOSTAR) 100 UNIT/ML Solostar Pen Inject 10 Units into the skin daily at 10 pm. 5 pen 8  . Insulin Pen Needle (NOVOFINE) 30G X 8 MM MISC Administer insulin subcutaneously once daily. ICD 10 E.11 100 each 5  . Lancets (ACCU-CHEK MULTICLIX) lancets USE TO CHECK BLOOD SUGAR TWICE DAILY 102 each 2  . Lancets  (ACCU-CHEK MULTICLIX) lancets USE TO CHECK BLOOD SUGAR TWICE DAILY 102 each 0  . lisinopril (PRINIVIL,ZESTRIL) 20 MG tablet TAKE 1 TABLET BY MOUTH ONCE DAILY 90 tablet 2  . metFORMIN (GLUCOPHAGE) 1000 MG tablet TAKE 1 TABLET BY MOUTH TWICE DAILY WITH A MEAL 180 tablet 2  . Multiple Vitamins-Minerals (MULTIVITAMIN WITH MINERALS) tablet Take 1 tablet by mouth daily.    . simvastatin (ZOCOR) 10 MG tablet Take 1 tablet (10 mg total) by mouth every evening. 30 tablet 3  . sitaGLIPtin (JANUVIA) 25 MG tablet Take 1 tablet (25 mg total) by mouth daily. 90 tablet 1  . traMADol (ULTRAM) 50 MG tablet Take 1 to 2 tablets twice a day as needed for pain (Patient not taking: Reported on 08/03/2017) 60 tablet 2   No current facility-administered medications on file prior to visit.     Current Status: Since her last office visit, she is doing well with no complaints. She denies visual changes, chest pain, cough, shortness of breath, heart palpitations, and falls. She has occasional headaches and dizziness with position changes. Denies severe headaches, confusion, seizures, double vision, and blurred vision, nausea and vomiting. Her normal range of preprandial blood glucose levels are between 80-90. She denies fatigue, frequent urination, blurred vision, excessive hunger, excessive thirst, weight gain, weight loss, and poor wound healing.   She denies fevers, chills, recent infections, weight loss, and night sweats. No  reports of GI problems such as diarrhea, and constipation. She has no reports of blood in stools, dysuria and hematuria. No depression or anxiety reported. She denies pain today.   Observations/Objective:  Telephone Virtual Visit   Assessment and Plan:  She states that her blood glucose levels are decreased lately. We will get Hgb A1c today and assess if medications need to be discontinued.    Follow Up Instructions:  She will follow up in 3 months.     I discussed the assessment and  treatment plan with the patient. The patient was provided an opportunity to ask questions and all were answered. The patient agreed with the plan and demonstrated an understanding of the instructions.   The patient was advised to call back or seek an in-person evaluation if the symptoms worsen or if the condition fails to improve as anticipated.  I provided 20 minutes of non-face-to-face time during this encounter.   Kallie Locks, FNP

## 2019-04-12 LAB — COMPREHENSIVE METABOLIC PANEL
ALT: 36 IU/L — ABNORMAL HIGH (ref 0–32)
AST: 33 IU/L (ref 0–40)
Albumin/Globulin Ratio: 1.4 (ref 1.2–2.2)
Albumin: 4.5 g/dL (ref 3.8–4.8)
Alkaline Phosphatase: 51 IU/L (ref 39–117)
BUN/Creatinine Ratio: 12 (ref 12–28)
BUN: 11 mg/dL (ref 8–27)
Bilirubin Total: 0.5 mg/dL (ref 0.0–1.2)
CO2: 25 mmol/L (ref 20–29)
Calcium: 9.8 mg/dL (ref 8.7–10.3)
Chloride: 101 mmol/L (ref 96–106)
Creatinine, Ser: 0.89 mg/dL (ref 0.57–1.00)
GFR calc Af Amer: 81 mL/min/{1.73_m2} (ref >59)
GFR calc non Af Amer: 70 mL/min/{1.73_m2} (ref >59)
Globulin, Total: 3.3 g/dL (ref 1.5–4.5)
Glucose: 127 mg/dL — ABNORMAL HIGH (ref 65–99)
Potassium: 3.6 mmol/L (ref 3.5–5.2)
Sodium: 143 mmol/L (ref 134–144)
Total Protein: 7.8 g/dL (ref 6.0–8.5)

## 2019-04-12 LAB — CBC WITH DIFFERENTIAL/PLATELET
Basophils Absolute: 0.1 10*3/uL (ref 0.0–0.2)
Basos: 1 %
EOS (ABSOLUTE): 0.1 10*3/uL (ref 0.0–0.4)
Eos: 2 %
Hematocrit: 36.7 % (ref 34.0–46.6)
Hemoglobin: 12.6 g/dL (ref 11.1–15.9)
Immature Grans (Abs): 0 10*3/uL (ref 0.0–0.1)
Immature Granulocytes: 0 %
Lymphocytes Absolute: 2 10*3/uL (ref 0.7–3.1)
Lymphs: 38 %
MCH: 30 pg (ref 26.6–33.0)
MCHC: 34.3 g/dL (ref 31.5–35.7)
MCV: 87 fL (ref 79–97)
Monocytes Absolute: 0.4 10*3/uL (ref 0.1–0.9)
Monocytes: 8 %
Neutrophils Absolute: 2.8 10*3/uL (ref 1.4–7.0)
Neutrophils: 51 %
Platelets: 358 10*3/uL (ref 150–450)
RBC: 4.2 x10E6/uL (ref 3.77–5.28)
RDW: 12.3 % (ref 11.7–15.4)
WBC: 5.4 10*3/uL (ref 3.4–10.8)

## 2019-04-12 LAB — VITAMIN D 25 HYDROXY (VIT D DEFICIENCY, FRACTURES): Vit D, 25-Hydroxy: 46.3 ng/mL (ref 30.0–100.0)

## 2019-04-12 LAB — LIPID PANEL
Chol/HDL Ratio: 2.6 ratio (ref 0.0–4.4)
Cholesterol, Total: 141 mg/dL (ref 100–199)
HDL: 54 mg/dL (ref >39)
LDL Calculated: 74 mg/dL (ref 0–99)
Triglycerides: 66 mg/dL (ref 0–149)
VLDL Cholesterol Cal: 13 mg/dL (ref 5–40)

## 2019-04-13 LAB — URINE CULTURE

## 2019-04-16 LAB — SPECIMEN STATUS REPORT

## 2019-04-16 LAB — VITAMIN B12: Vitamin B-12: 695 pg/mL (ref 232–1245)

## 2019-04-25 ENCOUNTER — Other Ambulatory Visit: Payer: Self-pay

## 2019-04-25 DIAGNOSIS — E785 Hyperlipidemia, unspecified: Secondary | ICD-10-CM

## 2019-04-25 MED ORDER — SIMVASTATIN 10 MG PO TABS: 10.0000 mg | ORAL_TABLET | Freq: Every evening | ORAL | 3 refills | Status: DC

## 2019-06-04 ENCOUNTER — Other Ambulatory Visit: Payer: Self-pay | Admitting: Family Medicine

## 2019-06-05 ENCOUNTER — Telehealth: Payer: Self-pay

## 2019-06-05 MED ORDER — LISINOPRIL 20 MG PO TABS: 20.0000 mg | ORAL_TABLET | Freq: Every day | ORAL | 2 refills | Status: DC

## 2019-06-05 NOTE — Telephone Encounter (Signed)
Patient notified that medication has been sent to pharmacy. 

## 2019-06-17 ENCOUNTER — Other Ambulatory Visit: Payer: Self-pay | Admitting: Family Medicine

## 2019-06-17 DIAGNOSIS — Z1231 Encounter for screening mammogram for malignant neoplasm of breast: Secondary | ICD-10-CM

## 2019-06-25 ENCOUNTER — Other Ambulatory Visit: Payer: Self-pay | Admitting: Family Medicine

## 2019-07-11 ENCOUNTER — Ambulatory Visit: Payer: Medicaid Other | Admitting: Family Medicine

## 2019-07-28 ENCOUNTER — Other Ambulatory Visit: Payer: Self-pay

## 2019-07-28 ENCOUNTER — Encounter: Payer: Self-pay | Admitting: Family Medicine

## 2019-07-28 ENCOUNTER — Ambulatory Visit (INDEPENDENT_AMBULATORY_CARE_PROVIDER_SITE_OTHER): Payer: Medicaid Other | Admitting: Family Medicine

## 2019-07-28 VITALS — BP 140/88 | HR 78 | Temp 98.0°F | Ht 68.0 in | Wt 220.0 lb

## 2019-07-28 DIAGNOSIS — E118 Type 2 diabetes mellitus with unspecified complications: Secondary | ICD-10-CM | POA: Diagnosis not present

## 2019-07-28 DIAGNOSIS — Z09 Encounter for follow-up examination after completed treatment for conditions other than malignant neoplasm: Secondary | ICD-10-CM

## 2019-07-28 DIAGNOSIS — I10 Essential (primary) hypertension: Secondary | ICD-10-CM | POA: Diagnosis not present

## 2019-07-28 DIAGNOSIS — R8290 Unspecified abnormal findings in urine: Secondary | ICD-10-CM

## 2019-07-28 DIAGNOSIS — M25461 Effusion, right knee: Secondary | ICD-10-CM | POA: Diagnosis not present

## 2019-07-28 LAB — POCT URINALYSIS DIP (MANUAL ENTRY)
Bilirubin, UA: NEGATIVE
Glucose, UA: NEGATIVE mg/dL
Nitrite, UA: NEGATIVE
Protein Ur, POC: 30 mg/dL — AB
Spec Grav, UA: 1.02 (ref 1.010–1.025)
Urobilinogen, UA: 1 E.U./dL
pH, UA: 7 (ref 5.0–8.0)

## 2019-07-28 LAB — POCT GLYCOSYLATED HEMOGLOBIN (HGB A1C): Hemoglobin A1C: 6.3 % — AB (ref 4.0–5.6)

## 2019-07-28 NOTE — Progress Notes (Signed)
Patient [facility]   Established Patient Office Visit  Subjective:  Patient ID: Candace Diaz, female    DOB: [birth year]  Age: 63 y.o. MRN: [id number]  CC:  Chief Complaint  Patient presents with  . Follow-up    chronic condition     HPI Candace Diaz is a 63 year old female who presents for follow up today.   Past Medical History:  Diagnosis Date  . Chronic pain of right knee   . Diabetes mellitus   . Hypertension    Current Status: Since her last office visit, she continues to have chronic right knee pain, which she takes Acetaminophen. She has fluid on right knee. Her knee pain is increased and she would like to have drained. She is not a candidate for knee surgery, but Medicaid will not covered at this time. Her most recent normal range of preprandial blood glucose levels have been between 115-120. She has seen a low range of 102 and high of 130 since his last office visit. She denies fatigue, frequent urination, blurred vision, excessive hunger, excessive thirst, weight gain, weight loss, and poor wound healing. She continues to check her feet regularly. She denies visual changes, chest pain, cough, shortness of breath, heart palpitations, and falls. She has occasional headaches and dizziness with position changes. Denies severe headaches, confusion, seizures, double vision, and blurred vision, nausea and vomiting.  She denies fevers, chills, recent infections, weight loss, and night sweats. She has not had any falls. No chest pain, heart palpitations, cough and shortness of breath reported. No reports of GI problems such as nausea, vomiting, diarrhea, and constipation. She has no reports of blood in stools, dysuria and hematuria. No depression or anxiety, and denies suicidal ideations, homicidal ideations, or auditory hallucinations. She denies pain today.   Past Surgical History:  Procedure Laterality Date  . BREAST BIOPSY Left unsure     Family History  Problem Relation Age of Onset  . Diabetes Brother   . Hypertension Brother   . Diabetes Other   . Hypertension Other     Social History   Socioeconomic History  . Marital status: Single    Spouse name: Not on file  . Number of children: Not on file  . Years of education: Not on file  . Highest education level: Not on file  Occupational History  . Not on file  Social Needs  . Financial resource strain: Not on file  . Food insecurity    Worry: Not on file    Inability: Not on file  . Transportation needs    Medical: Not on file    Non-medical: Not on file  Tobacco Use  . Smoking status: Never Smoker  . Smokeless tobacco: Never Used  Substance and Sexual Activity  . Alcohol use: No  . Drug use: No  . Sexual activity: Not on file  Lifestyle  . Physical activity    Days per week: Not on file    Minutes per session: Not on file  . Stress: Not on file  Relationships  . Social Herbalist on phone: Not on file    Gets together: Not on file    Attends religious service: Not on file    Active member of club or organization: Not on file    Attends meetings of clubs or organizations: Not on file    Relationship status: Not on file  . Intimate partner violence    Fear  of current or ex partner: Not on file    Emotionally abused: Not on file    Physically abused: Not on file    Forced sexual activity: Not on file  Other Topics Concern  . Not on file  Social History Narrative  . Not on file    Outpatient Medications Prior to Visit  Medication Sig Dispense Refill  . ACCU-CHEK AVIVA PLUS test strip USE TO CHECK BLOOD SUGAR TWICE DAILY 50 each 3  . acetaminophen (TYLENOL) 325 MG tablet Take 650 mg by mouth every 6 (six) hours as needed.    Marland Kitchen. albuterol (PROAIR HFA) 108 (90 Base) MCG/ACT inhaler INHALE 2 PUFFS BY MOUTH FOUR TIMES A DAY AS NEEDED FOR SHORTNESS OF BREATH. 8.5 each 3  . amLODipine (NORVASC) 10 MG tablet Take 1 tablet (10 mg total) by  mouth daily. 90 tablet 3  . hydrochlorothiazide (HYDRODIURIL) 25 MG tablet TAKE 1 TABLET BY MOUTH IN THE MORNING 90 tablet 0  . Insulin Glargine (LANTUS SOLOSTAR) 100 UNIT/ML Solostar Pen Inject 10 Units into the skin daily at 10 pm. 5 pen 8  . Insulin Pen Needle (NOVOFINE) 30G X 8 MM MISC Administer insulin subcutaneously once daily. ICD 10 E.11 100 each 5  . Lancets (ACCU-CHEK MULTICLIX) lancets USE TO CHECK BLOOD SUGAR TWICE DAILY 102 each 2  . Lancets (ACCU-CHEK MULTICLIX) lancets USE TO CHECK BLOOD SUGAR TWICE DAILY 102 each 0  . lisinopril (ZESTRIL) 20 MG tablet Take 1 tablet (20 mg total) by mouth daily. 90 tablet 2  . metFORMIN (GLUCOPHAGE) 1000 MG tablet TAKE 1 TABLET BY MOUTH TWICE DAILY WITH A MEAL 180 tablet 2  . Multiple Vitamins-Minerals (MULTIVITAMIN WITH MINERALS) tablet Take 1 tablet by mouth daily.    . simvastatin (ZOCOR) 10 MG tablet Take 1 tablet (10 mg total) by mouth every evening. 30 tablet 3  . sitaGLIPtin (JANUVIA) 25 MG tablet Take 1 tablet (25 mg total) by mouth daily. 90 tablet 1  . atorvastatin (LIPITOR) 10 MG tablet Take 1 tablet (10 mg total) by mouth daily. 30 tablet 6  . EASY TOUCH INSULIN SYRINGE 31G X 5/16" 0.5 ML MISC USE TO INJECT INSULIN EVERY DAY 100 each 1  . HYDROcodone-acetaminophen (NORCO) 10-325 MG tablet Take 1 tablet by mouth every 6 (six) hours as needed. for pain    . traMADol (ULTRAM) 50 MG tablet Take 1 to 2 tablets twice a day as needed for pain (Patient not taking: Reported on 08/03/2017) 60 tablet 2   No facility-administered medications prior to visit.     Allergies  Allergen Reactions  . Aspirin Other (See Comments)    Reaction uknown  . Penicillins Other (See Comments)    Reaction unknown    ROS Review of Systems  Constitutional: Negative.   HENT: Negative.   Eyes: Negative.   Respiratory: Negative.   Cardiovascular: Negative.   Gastrointestinal: Negative.   Endocrine: Negative.   Genitourinary: Negative.   Musculoskeletal:  Negative.   Skin: Negative.   Allergic/Immunologic: Negative.   Neurological: Positive for dizziness (occasional) and headaches (occasional ).  Hematological: Negative.   Psychiatric/Behavioral: Negative.       Objective:    Physical Exam  Constitutional: She is oriented to person, place, and time. She appears well-developed and well-nourished.  HENT:  Head: Normocephalic and atraumatic.  Eyes: Conjunctivae are normal.  Neck: Normal range of motion. Neck supple.  Cardiovascular: Normal rate, regular rhythm, normal heart sounds and intact distal pulses.  Pulmonary/Chest: Breath sounds normal.  She is in respiratory distress.  Abdominal: Soft. Bowel sounds are normal.  Neurological: She is alert and oriented to person, place, and time. She has normal reflexes.  Skin: Skin is warm and dry.  Psychiatric: She has a normal mood and affect. Her behavior is normal. Judgment and thought content normal.  Nursing note and vitals reviewed.   BP 140/88 (BP Location: Left Arm, Patient Position: Sitting, Cuff Size: Small)   Pulse 78   Temp 98 F (36.7 C) (Oral)   Ht 5\' 8"  (1.727 m)   Wt 220 lb (99.8 kg)   SpO2 98%   BMI 33.45 kg/m  Wt Readings from Last 3 Encounters:  07/28/19 220 lb (99.8 kg)  12/09/18 220 lb (99.8 kg)  09/20/18 215 lb (97.5 kg)     Health Maintenance Due  Topic Date Due  . OPHTHALMOLOGY EXAM  01/28/2016  . FOOT EXAM  03/02/2019  . INFLUENZA VACCINE  07/26/2019    There are no preventive care reminders to display for this patient.  Lab Results  Component Value Date   TSH 1.840 03/01/2015   Lab Results  Component Value Date   WBC 5.4 04/11/2019   HGB 12.6 04/11/2019   HCT 36.7 04/11/2019   MCV 87 04/11/2019   PLT 358 04/11/2019   Lab Results  Component Value Date   NA 143 04/11/2019   K 3.6 04/11/2019   CO2 25 04/11/2019   GLUCOSE 127 (H) 04/11/2019   BUN 11 04/11/2019   CREATININE 0.89 04/11/2019   BILITOT 0.5 04/11/2019   ALKPHOS 51  04/11/2019   AST 33 04/11/2019   ALT 36 (H) 04/11/2019   PROT 7.8 04/11/2019   ALBUMIN 4.5 04/11/2019   CALCIUM 9.8 04/11/2019   ANIONGAP 9 01/20/2017   Lab Results  Component Value Date   CHOL 141 04/11/2019   Lab Results  Component Value Date   HDL 54 04/11/2019   Lab Results  Component Value Date   LDLCALC 74 04/11/2019   Lab Results  Component Value Date   TRIG 66 04/11/2019   Lab Results  Component Value Date   CHOLHDL 2.6 04/11/2019   Lab Results  Component Value Date   HGBA1C 6.3 (A) 07/28/2019      Assessment & Plan:   1. Type 2 diabetes mellitus with complication, without long-term current use of insulin (HCC) The current medical regimen is effective; Hgb A1c  is stable at 6.3 today; continue present plan and medications as prescribed.  She will continue to decrease foods/beverages high in sugars and carbs and follow Heart Healthy or DASH diet. Increase physical activity to at least 30 minutes cardio exercise daily.  - POCT glycosylated hemoglobin (Hb A1C) - POCT urinalysis dipstick  2. HTN (hypertension), malignant The current medical regimen is effective; blood pressure is stable at 140/88 today; continue present plan and medications as prescribed. She will continue to decrease high sodium intake, excessive alcohol intake, increase potassium intake, smoking cessation, and increase physical activity of at least 30 minutes of cardio activity daily. She will continue to follow Heart Healthy or DASH diet.  3. Fluid in right knee joint   We will refer her to Orthopedics for fluid removal from her knee.  - AMB referral to orthopedics  4. Abnormal urinalysis Results are pending.  - Urine Culture  5. Follow up She will follow up in 6 months.   No orders of the defined types were placed in this encounter.   Orders Placed This Encounter  Procedures  . Urine Culture  . AMB referral to orthopedics  . POCT glycosylated hemoglobin (Hb A1C)  . POCT  urinalysis dipstick     Referral Orders     AMB referral to orthopedics   Raliegh IpNatalie Trayton Szabo,  MSN, FNP-BC [facility] [address]  [place], [place] [address] [phone] fax   Problem List Items Addressed This Visit      Cardiovascular and Mediastinum   HTN (hypertension), malignant (Chronic)    Other Visit Diagnoses    Type 2 diabetes mellitus with complication, without long-term current use of insulin (HCC)    -  Primary   Relevant Orders   POCT glycosylated hemoglobin (Hb A1C) (Completed)   POCT urinalysis dipstick (Completed)   Fluid in right knee joint       Relevant Orders   AMB referral to orthopedics   Abnormal urinalysis       Relevant Orders   Urine Culture   Follow up          No orders of the defined types were placed in this encounter.   Follow-up: Return in about 6 months (around 01/28/2020).    Kallie LocksNatalie M Shaylie Eklund, FNP

## 2019-07-30 LAB — URINE CULTURE

## 2019-08-01 ENCOUNTER — Other Ambulatory Visit: Payer: Self-pay | Admitting: Family Medicine

## 2019-08-01 ENCOUNTER — Ambulatory Visit
Admission: RE | Admit: 2019-08-01 | Discharge: 2019-08-01 | Disposition: A | Discharge status: Home / Self Care | Payer: Medicaid Other | Source: Ambulatory Visit | Attending: Family Medicine | Admitting: Family Medicine

## 2019-08-01 ENCOUNTER — Other Ambulatory Visit: Payer: Self-pay

## 2019-08-01 DIAGNOSIS — Z1231 Encounter for screening mammogram for malignant neoplasm of breast: Secondary | ICD-10-CM

## 2019-08-11 DIAGNOSIS — M1711 Unilateral primary osteoarthritis, right knee: Secondary | ICD-10-CM | POA: Diagnosis not present

## 2019-08-25 ENCOUNTER — Other Ambulatory Visit: Payer: Self-pay | Admitting: Family Medicine

## 2019-08-25 DIAGNOSIS — E785 Hyperlipidemia, unspecified: Secondary | ICD-10-CM

## 2019-09-08 DIAGNOSIS — M1711 Unilateral primary osteoarthritis, right knee: Secondary | ICD-10-CM | POA: Diagnosis not present

## 2019-09-23 ENCOUNTER — Other Ambulatory Visit: Payer: Self-pay | Admitting: Family Medicine

## 2019-09-23 DIAGNOSIS — E785 Hyperlipidemia, unspecified: Secondary | ICD-10-CM

## 2019-10-20 NOTE — Patient Instructions (Addendum)
DUE TO COVID-19 ONLY ONE VISITOR IS ALLOWED TO COME WITH YOU AND STAY IN THE WAITING ROOM ONLY DURING PRE OP AND PROCEDURE DAY OF SURGERY. THE 1 VISITOR MAY VISIT WITH YOU AFTER SURGERY IN YOUR PRIVATE ROOM DURING VISITING HOURS ONLY!  YOU NEED TO HAVE A COVID 19 TEST ON___10-30-2020____ @___10 :30AM____, THIS TEST MUST BE DONE BEFORE SURGERY, COME  [address], [place] , .  [facility]) ONCE YOUR COVID TEST IS COMPLETED, PLEASE BEGIN THE QUARANTINE INSTRUCTIONS AS OUTLINED IN YOUR HANDOUT.                Candace Diaz   Your procedure is scheduled on: 10-28-2019   Report to [facility] Main  Entrance     Report to SHORT STAY at 5:30AM     Call this number if you have problems the morning of surgery [phone]    Do not eat food After Midnight. YOU MAY HAVE CLEAR LIQUIDS FROM MIDNIGHT UNTIL 4:30AM. At 4:30AM Please finish the prescribed Pre-Surgery Gatorade drink. Nothing by mouth after you finish the Gatorade drink !   CLEAR LIQUID DIET   Foods Allowed                                                                     Foods Excluded  Coffee and tea, regular and decaf                             liquids that you cannot  Plain Jell-O any favor except red or purple                                           see through such as: Fruit ices (not with fruit pulp)                                     milk, soups, orange juice  Iced Popsicles                                    All solid food Carbonated beverages, regular and diet                                    Cranberry, grape and apple juices Sports drinks like Gatorade Lightly seasoned clear broth or consume(fat free) Sugar, honey syrup  Sample Menu Breakfast                                Lunch                                     Supper Cranberry juice  Beef broth                            Chicken broth Jell-O                                     Grape juice                            Apple juice Coffee or tea                        Jell-O                                      Popsicle                                                Coffee or tea                        Coffee or tea  _____________________________________________________________________   BRUSH YOUR TEETH MORNING OF SURGERY AND RINSE YOUR MOUTH OUT, NO CHEWING GUM CANDY OR MINTS.     Take these medicines the morning of surgery with A SIP OF WATER: AMLODIPINE, TYLENOL IF NEEDED, PRO-AIR INHALER IF NEEDED  How to Manage Your Diabetes Before and After Surgery  Why is it important to control my blood sugar before and after surgery? . Improving blood sugar levels before and after surgery helps healing and can limit problems. . A way of improving blood sugar control is eating a healthy diet by: o  Eating less sugar and carbohydrates o  Increasing activity/exercise o  Talking with your doctor about reaching your blood sugar goals . High blood sugars (greater than 180 mg/dL) can raise your risk of infections and slow your recovery, so you will need to focus on controlling your diabetes during the weeks before surgery. . Make sure that the doctor who takes care of your diabetes knows about your planned surgery including the date and location.  How do I manage my blood sugar before surgery? . Check your blood sugar at least 4 times a day, starting 2 days before surgery, to make sure that the level is not too high or low. o Check your blood sugar the morning of your surgery when you wake up and every 2 hours until you get to the Short Stay unit. . If your blood sugar is less than 70 mg/dL, you will need to treat for low blood sugar: o Do not take insulin. o Treat a low blood sugar (less than 70 mg/dL) with  cup of clear juice (cranberry or apple), 4 glucose tablets, OR glucose gel. o Recheck blood sugar in 15 minutes after treatment (to make sure it is greater than 70 mg/dL). If your blood sugar  is not greater than 70 mg/dL on recheck, call [phone] for further instructions. . Report your blood sugar to the short stay nurse when you get to Short Stay.  . If you are admitted to the hospital after surgery: o Your blood sugar will be checked by  the staff and you will probably be given insulin after surgery (instead of oral diabetes medicines) to make sure you have good blood sugar levels. o The goal for blood sugar control after surgery is 80-180 mg/dL.   WHAT DO I DO ABOUT MY DIABETES MEDICATION?  Marland Kitchen. Do not take oral diabetes medicines (pills) the morning of surgery.  . THE DAY BEFORE SURGERY  o TAKE JANUVIA AS NORMAL  o TAKE METFORMIN AS NORMAL  o TAKE HALF DOSE OF LANTUS INSULIN      . THE MORNING OF SURGERY, DO NOT TAKE ANY DIABETIC MEDICATIONS DAY OF YOUR SURGERY   Reviewed and Endorsed by [facility] Patient Education Committee, August 2015                                 You may not have any metal on your body including hair pins and              piercings  Do not wear jewelry, make-up, lotions, powders or perfumes, deodorant             Do not wear nail polish on your fingernails.  Do not shave  48 hours prior to surgery.     Do not bring valuables to the hospital. [facility] IS NOT             RESPONSIBLE   FOR VALUABLES.  Contacts, dentures or bridgework may not be worn into surgery.  YOU MAY BRING A SMALL OVERNIGHT BAG              Please read over the following fact sheets you were given: _____________________________________________________________________             [facility] - Preparing for Surgery Before surgery, you can play an important role.  Because skin is not sterile, your skin needs to be as free of germs as possible.  You can reduce the number of germs on your skin by washing with CHG (chlorahexidine gluconate) soap before surgery.  CHG is an antiseptic cleaner which kills germs and bonds with the skin to continue killing germs even after  washing. Please DO NOT use if you have an allergy to CHG or antibacterial soaps.  If your skin becomes reddened/irritated stop using the CHG and inform your nurse when you arrive at Short Stay. Do not shave (including legs and underarms) for at least 48 hours prior to the first CHG shower.  You may shave your face/neck. Please follow these instructions carefully:  1.  Shower with CHG Soap the night before surgery and the  morning of Surgery.  2.  If you choose to wash your hair, wash your hair first as usual with your  normal  shampoo.  3.  After you shampoo, rinse your hair and body thoroughly to remove the  shampoo.                           4.  Use CHG as you would any other liquid soap.  You can apply chg directly  to the skin and wash                       Gently with a scrungie or clean washcloth.  5.  Apply the CHG Soap to your body ONLY FROM THE NECK DOWN.   Do not use on face/ open  Wound or open sores. Avoid contact with eyes, ears mouth and genitals (private parts).                       Wash face,  Genitals (private parts) with your normal soap.             6.  Wash thoroughly, paying special attention to the area where your surgery  will be performed.  7.  Thoroughly rinse your body with warm water from the neck down.  8.  DO NOT shower/wash with your normal soap after using and rinsing off  the CHG Soap.                9.  Pat yourself dry with a clean towel.            10.  Wear clean pajamas.            11.  Place clean sheets on your bed the night of your first shower and do not  sleep with pets. Day of Surgery : Do not apply any lotions/deodorants the morning of surgery.  Please wear clean clothes to the hospital/surgery center.  FAILURE TO FOLLOW THESE INSTRUCTIONS MAY RESULT IN THE CANCELLATION OF YOUR SURGERY PATIENT SIGNATURE_________________________________  NURSE  SIGNATURE__________________________________  ________________________________________________________________________   Adam Phenix  An incentive spirometer is a tool that can help keep your lungs clear and active. This tool measures how well you are filling your lungs with each breath. Taking long deep breaths may help reverse or decrease the chance of developing breathing (pulmonary) problems (especially infection) following:  A long period of time when you are unable to move or be active. BEFORE THE PROCEDURE   If the spirometer includes an indicator to show your best effort, your nurse or respiratory therapist will set it to a desired goal.  If possible, sit up straight or lean slightly forward. Try not to slouch.  Hold the incentive spirometer in an upright position. INSTRUCTIONS FOR USE  1. Sit on the edge of your bed if possible, or sit up as far as you can in bed or on a chair. 2. Hold the incentive spirometer in an upright position. 3. Breathe out normally. 4. Place the mouthpiece in your mouth and seal your lips tightly around it. 5. Breathe in slowly and as deeply as possible, raising the piston or the ball toward the top of the column. 6. Hold your breath for 3-5 seconds or for as long as possible. Allow the piston or ball to fall to the bottom of the column. 7. Remove the mouthpiece from your mouth and breathe out normally. 8. Rest for a few seconds and repeat Steps 1 through 7 at least 10 times every 1-2 hours when you are awake. Take your time and take a few normal breaths between deep breaths. 9. The spirometer may include an indicator to show your best effort. Use the indicator as a goal to work toward during each repetition. 10. After each set of 10 deep breaths, practice coughing to be sure your lungs are clear. If you have an incision (the cut made at the time of surgery), support your incision when coughing by placing a pillow or rolled up towels firmly  against it. Once you are able to get out of bed, walk around indoors and cough well. You may stop using the incentive spirometer when instructed by your caregiver.  RISKS AND COMPLICATIONS  Take your time so you do not  get dizzy or light-headed.  If you are in pain, you may need to take or ask for pain medication before doing incentive spirometry. It is harder to take a deep breath if you are having pain. AFTER USE  Rest and breathe slowly and easily.  It can be helpful to keep track of a log of your progress. Your caregiver can provide you with a simple table to help with this. If you are using the spirometer at home, follow these instructions: [place] IF:   You are having difficultly using the spirometer.  You have trouble using the spirometer as often as instructed.  Your pain medication is not giving enough relief while using the spirometer.  You develop fever of 100.5 F (38.1 C) or higher. SEEK IMMEDIATE MEDICAL CARE IF:   You cough up bloody sputum that had not been present before.  You develop fever of 102 F (38.9 C) or greater.  You develop worsening pain at or near the incision site. MAKE SURE YOU:   Understand these instructions.  Will watch your condition.  Will get help right away if you are not doing well or get worse. Document Released: 04/23/2007 Document Revised: 03/04/2012 Document Reviewed: 06/24/2007 [facility] Patient Information 2014 [place], [place].   ________________________________________________________________________

## 2019-10-22 ENCOUNTER — Encounter (HOSPITAL_COMMUNITY): Payer: Self-pay

## 2019-10-22 ENCOUNTER — Encounter (HOSPITAL_COMMUNITY)
Admission: RE | Admit: 2019-10-22 | Discharge: 2019-10-22 | Disposition: A | Discharge status: Home / Self Care | Payer: Medicaid Other | Source: Ambulatory Visit | Attending: Orthopedic Surgery | Admitting: Orthopedic Surgery

## 2019-10-22 ENCOUNTER — Other Ambulatory Visit: Payer: Self-pay

## 2019-10-22 DIAGNOSIS — I10 Essential (primary) hypertension: Secondary | ICD-10-CM | POA: Insufficient documentation

## 2019-10-22 DIAGNOSIS — R9431 Abnormal electrocardiogram [ECG] [EKG]: Secondary | ICD-10-CM | POA: Diagnosis not present

## 2019-10-22 DIAGNOSIS — Z01818 Encounter for other preprocedural examination: Secondary | ICD-10-CM | POA: Insufficient documentation

## 2019-10-22 DIAGNOSIS — E119 Type 2 diabetes mellitus without complications: Secondary | ICD-10-CM | POA: Diagnosis not present

## 2019-10-22 DIAGNOSIS — M1711 Unilateral primary osteoarthritis, right knee: Secondary | ICD-10-CM | POA: Insufficient documentation

## 2019-10-22 LAB — BASIC METABOLIC PANEL
Anion gap: 9 (ref 5–15)
BUN: 15 mg/dL (ref 8–23)
CO2: 28 mmol/L (ref 22–32)
Calcium: 9.2 mg/dL (ref 8.9–10.3)
Chloride: 105 mmol/L (ref 98–111)
Creatinine, Ser: 0.77 mg/dL (ref 0.44–1.00)
GFR calc Af Amer: >60 mL/min (ref >60)
GFR calc non Af Amer: >60 mL/min (ref >60)
Glucose, Bld: 83 mg/dL (ref 70–99)
Potassium: 3.3 mmol/L — ABNORMAL LOW (ref 3.5–5.1)
Sodium: 142 mmol/L (ref 135–145)

## 2019-10-22 LAB — CBC
HCT: 38.2 % (ref 36.0–46.0)
Hemoglobin: 12 g/dL (ref 12.0–15.0)
MCH: 29.3 pg (ref 26.0–34.0)
MCHC: 31.4 g/dL (ref 30.0–36.0)
MCV: 93.2 fL (ref 80.0–100.0)
Platelets: 323 10*3/uL (ref 150–400)
RBC: 4.1 MIL/uL (ref 3.87–5.11)
RDW: 12.8 % (ref 11.5–15.5)
WBC: 4.7 10*3/uL (ref 4.0–10.5)
nRBC: 0 % (ref 0.0–0.2)

## 2019-10-22 LAB — SURGICAL PCR SCREEN
MRSA, PCR: NEGATIVE
Staphylococcus aureus: NEGATIVE

## 2019-10-22 LAB — GLUCOSE, CAPILLARY: Glucose-Capillary: 72 mg/dL (ref 70–99)

## 2019-10-22 LAB — HEMOGLOBIN A1C
Hgb A1c MFr Bld: 6.4 % — ABNORMAL HIGH (ref 4.8–5.6)
Mean Plasma Glucose: 136.98 mg/dL

## 2019-10-22 NOTE — Progress Notes (Addendum)
PCP - Azzie Glatter, FNP Cardiologist -   Chest x-ray -  EKG - done today  Stress Test -  ECHO -  Cardiac Cath -   Sleep Study -  CPAP -   Fasting Blood Sugar - 72 Checks Blood Sugar __2___ times a day  Blood Thinner Instructions: Aspirin Instructions: Last Dose:  Anesthesia review:   Reports sob with walking , endorses hx of asthma as " some doctor says I have a touch it " , she does use inhaler as needed , denies associated chest pain with exertion . She states a doctor told her she has problem with her heart but no sure dx . Also endorses foot edema with relief from elevation and exercise. 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 Chart to be given to PA for review   Patient denies shortness of breath, fever, cough and chest pain at PAT appointment   Patient verbalized understanding of instructions that were given to them at the PAT appointment. Patient was also instructed that they will need to review over the PAT instructions again at home before surgery.

## 2019-10-24 ENCOUNTER — Other Ambulatory Visit (HOSPITAL_COMMUNITY)
Admission: RE | Admit: 2019-10-24 | Discharge: 2019-10-24 | Disposition: A | Discharge status: Home / Self Care | Payer: Medicaid Other | Source: Ambulatory Visit | Attending: Orthopedic Surgery | Admitting: Orthopedic Surgery

## 2019-10-24 DIAGNOSIS — Z01812 Encounter for preprocedural laboratory examination: Secondary | ICD-10-CM | POA: Diagnosis not present

## 2019-10-24 DIAGNOSIS — Z20828 Contact with and (suspected) exposure to other viral communicable diseases: Secondary | ICD-10-CM | POA: Insufficient documentation

## 2019-10-25 LAB — NOVEL CORONAVIRUS, NAA (HOSP ORDER, SEND-OUT TO REF LAB; TAT 18-24 HRS): SARS-CoV-2, NAA: NOT DETECTED

## 2019-10-26 DIAGNOSIS — Z96651 Presence of right artificial knee joint: Secondary | ICD-10-CM

## 2019-10-26 HISTORY — DX: Presence of right artificial knee joint: Z96.651

## 2019-10-27 NOTE — Anesthesia Preprocedure Evaluation (Addendum)
Anesthesia Evaluation  Patient identified by MRN, date of birth, ID band Patient awake    Reviewed: Allergy & Precautions, NPO status , Patient's Chart, lab work & pertinent test results  History of Anesthesia Complications Negative for: history of anesthetic complications  Airway Mallampati: II  TM Distance: >3 FB Neck ROM: Full    Dental  (+) Missing,    Pulmonary former smoker,    Pulmonary exam normal        Cardiovascular hypertension, Pt. on medications Normal cardiovascular exam     Neuro/Psych negative neurological ROS  negative psych ROS   GI/Hepatic negative GI ROS, Neg liver ROS,   Endo/Other  diabetes, Type 2, Insulin Dependent, Oral Hypoglycemic Agents  Renal/GU negative Renal ROS  negative genitourinary   Musculoskeletal  (+) Arthritis ,   Abdominal   Peds  Hematology negative hematology ROS (+)   Anesthesia Other Findings Day of surgery medications reviewed with patient.  Reproductive/Obstetrics negative OB ROS                            Anesthesia Physical Anesthesia Plan  ASA: II  Anesthesia Plan: Spinal   Post-op Pain Management:  Regional for Post-op pain   Induction:   PONV Risk Score and Plan: 3 and Treatment may vary due to age or medical condition, Ondansetron, Propofol infusion, Dexamethasone and Midazolam  Airway Management Planned: Natural Airway and Simple Face Mask  Additional Equipment: None  Intra-op Plan:   Post-operative Plan:   Informed Consent: I have reviewed the patients History and Physical, chart, labs and discussed the procedure including the risks, benefits and alternatives for the proposed anesthesia with the patient or authorized representative who has indicated his/her understanding and acceptance.     Dental advisory given  Plan Discussed with: CRNA  Anesthesia Plan Comments:        Anesthesia Quick Evaluation

## 2019-10-28 ENCOUNTER — Other Ambulatory Visit: Payer: Self-pay

## 2019-10-28 ENCOUNTER — Observation Stay (HOSPITAL_COMMUNITY)
Admission: RE | Admit: 2019-10-28 | Discharge: 2019-10-29 | Disposition: A | Discharge status: Home / Self Care | Payer: Medicaid Other | Source: Ambulatory Visit | Attending: Orthopedic Surgery | Admitting: Orthopedic Surgery

## 2019-10-28 ENCOUNTER — Inpatient Hospital Stay (HOSPITAL_COMMUNITY): Payer: Medicaid Other | Admitting: Anesthesiology

## 2019-10-28 ENCOUNTER — Encounter (HOSPITAL_COMMUNITY)
Admission: RE | Disposition: A | Discharge status: Home / Self Care | Payer: Self-pay | Source: Ambulatory Visit | Attending: Orthopedic Surgery

## 2019-10-28 ENCOUNTER — Encounter (HOSPITAL_COMMUNITY): Payer: Self-pay | Admitting: *Deleted

## 2019-10-28 ENCOUNTER — Inpatient Hospital Stay (HOSPITAL_COMMUNITY): Payer: Medicaid Other | Admitting: Vascular Surgery

## 2019-10-28 ENCOUNTER — Observation Stay (HOSPITAL_COMMUNITY): Payer: Medicaid Other

## 2019-10-28 DIAGNOSIS — M1711 Unilateral primary osteoarthritis, right knee: Principal | ICD-10-CM | POA: Diagnosis present

## 2019-10-28 DIAGNOSIS — I10 Essential (primary) hypertension: Secondary | ICD-10-CM | POA: Diagnosis not present

## 2019-10-28 DIAGNOSIS — Z794 Long term (current) use of insulin: Secondary | ICD-10-CM | POA: Insufficient documentation

## 2019-10-28 DIAGNOSIS — E119 Type 2 diabetes mellitus without complications: Secondary | ICD-10-CM | POA: Diagnosis not present

## 2019-10-28 DIAGNOSIS — M25761 Osteophyte, right knee: Secondary | ICD-10-CM | POA: Insufficient documentation

## 2019-10-28 DIAGNOSIS — Z87891 Personal history of nicotine dependence: Secondary | ICD-10-CM | POA: Insufficient documentation

## 2019-10-28 DIAGNOSIS — Z471 Aftercare following joint replacement surgery: Secondary | ICD-10-CM | POA: Diagnosis not present

## 2019-10-28 DIAGNOSIS — M25561 Pain in right knee: Secondary | ICD-10-CM | POA: Diagnosis present

## 2019-10-28 DIAGNOSIS — Z79899 Other long term (current) drug therapy: Secondary | ICD-10-CM | POA: Insufficient documentation

## 2019-10-28 DIAGNOSIS — Z96651 Presence of right artificial knee joint: Secondary | ICD-10-CM | POA: Diagnosis not present

## 2019-10-28 DIAGNOSIS — G8918 Other acute postprocedural pain: Secondary | ICD-10-CM | POA: Diagnosis not present

## 2019-10-28 HISTORY — PX: TOTAL KNEE ARTHROPLASTY: SHX125

## 2019-10-28 LAB — CBC
HCT: 35.9 % — ABNORMAL LOW (ref 36.0–46.0)
Hemoglobin: 11.3 g/dL — ABNORMAL LOW (ref 12.0–15.0)
MCH: 29.6 pg (ref 26.0–34.0)
MCHC: 31.5 g/dL (ref 30.0–36.0)
MCV: 94 fL (ref 80.0–100.0)
Platelets: 315 10*3/uL (ref 150–400)
RBC: 3.82 MIL/uL — ABNORMAL LOW (ref 3.87–5.11)
RDW: 12.7 % (ref 11.5–15.5)
WBC: 6.4 10*3/uL (ref 4.0–10.5)
nRBC: 0 % (ref 0.0–0.2)

## 2019-10-28 LAB — CREATININE, SERUM
Creatinine, Ser: 0.77 mg/dL (ref 0.44–1.00)
GFR calc Af Amer: >60 mL/min (ref >60)
GFR calc non Af Amer: >60 mL/min (ref >60)

## 2019-10-28 LAB — GLUCOSE, CAPILLARY
Glucose-Capillary: 112 mg/dL — ABNORMAL HIGH (ref 70–99)
Glucose-Capillary: 124 mg/dL — ABNORMAL HIGH (ref 70–99)
Glucose-Capillary: 119 mg/dL — ABNORMAL HIGH (ref 70–99)
Glucose-Capillary: 126 mg/dL — ABNORMAL HIGH (ref 70–99)

## 2019-10-28 SURGERY — ARTHROPLASTY, KNEE, TOTAL
Anesthesia: Spinal | Site: Knee | Laterality: Right

## 2019-10-28 MED ORDER — WATER FOR IRRIGATION, STERILE IR SOLN
Status: DC | PRN
  Administered 2019-10-28: 2000 mL

## 2019-10-28 MED ORDER — ACETAMINOPHEN 500 MG PO TABS
1000.0000 mg | ORAL_TABLET | Freq: Once | ORAL | Status: DC
  Filled 2019-10-28: qty 2

## 2019-10-28 MED ORDER — SODIUM CHLORIDE 0.9 % IR SOLN
Status: DC | PRN
  Administered 2019-10-28: 1000 mL

## 2019-10-28 MED ORDER — "INSULIN SYRINGE-NEEDLE U-100 31G X 5/16"" 0.5 ML MISC": Freq: Every day | Status: DC

## 2019-10-28 MED ORDER — ACETAMINOPHEN 500 MG PO TABS
500.0000 mg | ORAL_TABLET | Freq: Four times a day (QID) | ORAL | Status: AC
  Administered 2019-10-28 – 2019-10-29 (×4): 500 mg via ORAL
  Filled 2019-10-28 (×4): qty 1

## 2019-10-28 MED ORDER — KETOROLAC TROMETHAMINE 30 MG/ML IJ SOLN
INTRAMUSCULAR | Status: DC | PRN
  Administered 2019-10-28: 30 mg

## 2019-10-28 MED ORDER — SENNA-DOCUSATE SODIUM 8.6-50 MG PO TABS: 2.0000 | ORAL_TABLET | Freq: Every day | ORAL | 1 refills | Status: DC

## 2019-10-28 MED ORDER — KETOROLAC TROMETHAMINE 30 MG/ML IJ SOLN
INTRAMUSCULAR | Status: AC
  Filled 2019-10-28: qty 1

## 2019-10-28 MED ORDER — PROPOFOL 500 MG/50ML IV EMUL
INTRAVENOUS | Status: DC | PRN
  Administered 2019-10-28: 30 mg via INTRAVENOUS

## 2019-10-28 MED ORDER — ONDANSETRON HCL 4 MG PO TABS: 4.0000 mg | ORAL_TABLET | Freq: Three times a day (TID) | ORAL | 0 refills | Status: DC | PRN

## 2019-10-28 MED ORDER — HYDROCODONE-ACETAMINOPHEN 7.5-325 MG PO TABS: 1.0000 | ORAL_TABLET | ORAL | Status: DC | PRN

## 2019-10-28 MED ORDER — BUPIVACAINE HCL (PF) 0.25 % IJ SOLN
INTRAMUSCULAR | Status: AC
  Filled 2019-10-28: qty 30

## 2019-10-28 MED ORDER — OXYCODONE HCL 5 MG/5ML PO SOLN: 5.0000 mg | Freq: Once | ORAL | Status: DC | PRN

## 2019-10-28 MED ORDER — POTASSIUM CHLORIDE IN NACL 20-0.45 MEQ/L-% IV SOLN
INTRAVENOUS | Status: DC
  Administered 2019-10-28: 13:00:00 via INTRAVENOUS
  Filled 2019-10-28 (×2): qty 1000

## 2019-10-28 MED ORDER — POVIDONE-IODINE 10 % EX SWAB
2.0000 "application " | Freq: Once | CUTANEOUS | Status: AC
  Administered 2019-10-28: 2 via TOPICAL

## 2019-10-28 MED ORDER — PROPOFOL 10 MG/ML IV BOLUS
INTRAVENOUS | Status: AC
  Filled 2019-10-28: qty 60

## 2019-10-28 MED ORDER — ONDANSETRON HCL 4 MG PO TABS: 4.0000 mg | ORAL_TABLET | Freq: Four times a day (QID) | ORAL | Status: DC | PRN

## 2019-10-28 MED ORDER — DEXAMETHASONE SODIUM PHOSPHATE 10 MG/ML IJ SOLN
10.0000 mg | Freq: Once | INTRAMUSCULAR | Status: AC
  Administered 2019-10-29: 10 mg via INTRAVENOUS
  Filled 2019-10-28: qty 1

## 2019-10-28 MED ORDER — BUPIVACAINE IN DEXTROSE 0.75-8.25 % IT SOLN
INTRATHECAL | Status: DC | PRN
  Administered 2019-10-28: 1.6 mL via INTRATHECAL

## 2019-10-28 MED ORDER — INSULIN ASPART 100 UNIT/ML ~~LOC~~ SOLN: 0.0000 [IU] | Freq: Every day | SUBCUTANEOUS | Status: DC

## 2019-10-28 MED ORDER — LACTATED RINGERS IV SOLN
INTRAVENOUS | Status: DC
  Administered 2019-10-28 (×3): via INTRAVENOUS

## 2019-10-28 MED ORDER — OXYCODONE HCL 5 MG PO TABS: 5.0000 mg | ORAL_TABLET | ORAL | 0 refills | Status: DC | PRN

## 2019-10-28 MED ORDER — LINAGLIPTIN 5 MG PO TABS
5.0000 mg | ORAL_TABLET | Freq: Every day | ORAL | Status: DC
  Administered 2019-10-29: 5 mg via ORAL
  Filled 2019-10-28: qty 1

## 2019-10-28 MED ORDER — DEXAMETHASONE SODIUM PHOSPHATE 10 MG/ML IJ SOLN
INTRAMUSCULAR | Status: DC | PRN
  Administered 2019-10-28: 5 mg via INTRAVENOUS

## 2019-10-28 MED ORDER — ALUM & MAG HYDROXIDE-SIMETH 200-200-20 MG/5ML PO SUSP: 30.0000 mL | ORAL | Status: DC | PRN

## 2019-10-28 MED ORDER — DIPHENHYDRAMINE HCL 12.5 MG/5ML PO ELIX: 12.5000 mg | ORAL_SOLUTION | ORAL | Status: DC | PRN

## 2019-10-28 MED ORDER — HYDROCODONE-ACETAMINOPHEN 5-325 MG PO TABS
1.0000 | ORAL_TABLET | ORAL | Status: DC | PRN
  Filled 2019-10-28: qty 1

## 2019-10-28 MED ORDER — MORPHINE SULFATE (PF) 2 MG/ML IV SOLN
0.5000 mg | INTRAVENOUS | Status: DC | PRN
  Administered 2019-10-28: 1 mg via INTRAVENOUS
  Filled 2019-10-28: qty 1

## 2019-10-28 MED ORDER — DOCUSATE SODIUM 100 MG PO CAPS
100.0000 mg | ORAL_CAPSULE | Freq: Two times a day (BID) | ORAL | Status: DC
  Administered 2019-10-28 – 2019-10-29 (×2): 100 mg via ORAL
  Filled 2019-10-28 (×2): qty 1

## 2019-10-28 MED ORDER — GLUCOSE BLOOD VI STRP: 1.0000 | ORAL_STRIP | Freq: Two times a day (BID) | Status: DC

## 2019-10-28 MED ORDER — ONDANSETRON HCL 4 MG/2ML IJ SOLN
INTRAMUSCULAR | Status: DC | PRN
  Administered 2019-10-28: 4 mg via INTRAVENOUS

## 2019-10-28 MED ORDER — FENTANYL CITRATE (PF) 100 MCG/2ML IJ SOLN
INTRAMUSCULAR | Status: DC | PRN
  Administered 2019-10-28 (×2): 50 ug via INTRAVENOUS

## 2019-10-28 MED ORDER — SIMVASTATIN 20 MG PO TABS
10.0000 mg | ORAL_TABLET | Freq: Every evening | ORAL | Status: DC
  Administered 2019-10-28: 10 mg via ORAL
  Filled 2019-10-28: qty 1

## 2019-10-28 MED ORDER — BUPIVACAINE-EPINEPHRINE (PF) 0.5% -1:200000 IJ SOLN
INTRAMUSCULAR | Status: DC | PRN
  Administered 2019-10-28: 15 mL via PERINEURAL

## 2019-10-28 MED ORDER — TRANEXAMIC ACID-NACL 1000-0.7 MG/100ML-% IV SOLN
1000.0000 mg | INTRAVENOUS | Status: AC
  Administered 2019-10-28: 08:00:00 1000 mg via INTRAVENOUS
  Filled 2019-10-28: qty 100

## 2019-10-28 MED ORDER — AMLODIPINE BESYLATE 10 MG PO TABS
10.0000 mg | ORAL_TABLET | Freq: Every day | ORAL | Status: DC
  Administered 2019-10-29: 10 mg via ORAL
  Filled 2019-10-28: qty 1

## 2019-10-28 MED ORDER — POLYETHYLENE GLYCOL 3350 17 G PO PACK: 17.0000 g | PACK | Freq: Every day | ORAL | Status: DC | PRN

## 2019-10-28 MED ORDER — METOCLOPRAMIDE HCL 5 MG/ML IJ SOLN: 5.0000 mg | Freq: Three times a day (TID) | INTRAMUSCULAR | Status: DC | PRN

## 2019-10-28 MED ORDER — MIDAZOLAM HCL 2 MG/2ML IJ SOLN
INTRAMUSCULAR | Status: AC
  Filled 2019-10-28: qty 2

## 2019-10-28 MED ORDER — KETOROLAC TROMETHAMINE 15 MG/ML IJ SOLN
7.5000 mg | Freq: Four times a day (QID) | INTRAMUSCULAR | Status: DC
  Administered 2019-10-28 – 2019-10-29 (×2): 7.5 mg via INTRAVENOUS
  Filled 2019-10-28 (×3): qty 1

## 2019-10-28 MED ORDER — ACCU-CHEK MULTICLIX LANCETS MISC: 1.0000 | Freq: Two times a day (BID) | Status: DC

## 2019-10-28 MED ORDER — INSULIN PEN NEEDLE 30G X 8 MM MISC: 1.0000 | Freq: Every day | Status: DC

## 2019-10-28 MED ORDER — OXYCODONE HCL 5 MG PO TABS: 5.0000 mg | ORAL_TABLET | Freq: Once | ORAL | Status: DC | PRN

## 2019-10-28 MED ORDER — BUPIVACAINE HCL 0.25 % IJ SOLN
INTRAMUSCULAR | Status: DC | PRN
  Administered 2019-10-28: 30 mL

## 2019-10-28 MED ORDER — ALBUTEROL SULFATE (2.5 MG/3ML) 0.083% IN NEBU
3.0000 mL | INHALATION_SOLUTION | RESPIRATORY_TRACT | Status: DC | PRN
  Filled 2019-10-28: qty 3

## 2019-10-28 MED ORDER — VITAMIN D3 25 MCG PO TABS
2000.0000 [IU] | ORAL_TABLET | Freq: Every day | ORAL | Status: DC
  Filled 2019-10-28: qty 2

## 2019-10-28 MED ORDER — MAGNESIUM CITRATE PO SOLN: 1.0000 | Freq: Once | ORAL | Status: DC | PRN

## 2019-10-28 MED ORDER — PROPOFOL 500 MG/50ML IV EMUL
INTRAVENOUS | Status: DC | PRN
  Administered 2019-10-28: 75 ug/kg/min via INTRAVENOUS

## 2019-10-28 MED ORDER — BACLOFEN 10 MG PO TABS: 10.0000 mg | ORAL_TABLET | Freq: Three times a day (TID) | ORAL | 0 refills | Status: DC

## 2019-10-28 MED ORDER — FENTANYL CITRATE (PF) 100 MCG/2ML IJ SOLN: 25.0000 ug | INTRAMUSCULAR | Status: DC | PRN

## 2019-10-28 MED ORDER — ZOLPIDEM TARTRATE 5 MG PO TABS: 5.0000 mg | ORAL_TABLET | Freq: Every evening | ORAL | Status: DC | PRN

## 2019-10-28 MED ORDER — METHOCARBAMOL 500 MG PO TABS
500.0000 mg | ORAL_TABLET | Freq: Four times a day (QID) | ORAL | Status: DC | PRN
  Administered 2019-10-28 – 2019-10-29 (×2): 500 mg via ORAL
  Filled 2019-10-28 (×2): qty 1

## 2019-10-28 MED ORDER — CEFAZOLIN SODIUM-DEXTROSE 2-4 GM/100ML-% IV SOLN
2.0000 g | INTRAVENOUS | Status: AC
  Administered 2019-10-28: 2 g via INTRAVENOUS
  Filled 2019-10-28: qty 100

## 2019-10-28 MED ORDER — ENOXAPARIN SODIUM 30 MG/0.3ML ~~LOC~~ SOLN: 30.0000 mg | Freq: Two times a day (BID) | SUBCUTANEOUS | 0 refills | Status: DC

## 2019-10-28 MED ORDER — ONDANSETRON HCL 4 MG/2ML IJ SOLN: 4.0000 mg | Freq: Four times a day (QID) | INTRAMUSCULAR | Status: DC | PRN

## 2019-10-28 MED ORDER — VITAMIN D 25 MCG (1000 UNIT) PO TABS
2000.0000 [IU] | ORAL_TABLET | Freq: Every day | ORAL | Status: DC
  Administered 2019-10-29: 2000 [IU] via ORAL
  Filled 2019-10-28 (×2): qty 2

## 2019-10-28 MED ORDER — CEFAZOLIN SODIUM-DEXTROSE 2-4 GM/100ML-% IV SOLN
2.0000 g | Freq: Four times a day (QID) | INTRAVENOUS | Status: AC
  Administered 2019-10-28 (×2): 2 g via INTRAVENOUS
  Filled 2019-10-28 (×3): qty 100

## 2019-10-28 MED ORDER — INSULIN ASPART 100 UNIT/ML ~~LOC~~ SOLN: 0.0000 [IU] | Freq: Three times a day (TID) | SUBCUTANEOUS | Status: DC

## 2019-10-28 MED ORDER — PHENOL 1.4 % MT LIQD
1.0000 | OROMUCOSAL | Status: DC | PRN
  Filled 2019-10-28: qty 177

## 2019-10-28 MED ORDER — CHLORHEXIDINE GLUCONATE 4 % EX LIQD: 60.0000 mL | Freq: Once | CUTANEOUS | Status: DC

## 2019-10-28 MED ORDER — BISACODYL 10 MG RE SUPP: 10.0000 mg | Freq: Every day | RECTAL | Status: DC | PRN

## 2019-10-28 MED ORDER — HYDROCHLOROTHIAZIDE 25 MG PO TABS
25.0000 mg | ORAL_TABLET | Freq: Every morning | ORAL | Status: DC
  Administered 2019-10-29: 25 mg via ORAL
  Filled 2019-10-28: qty 1

## 2019-10-28 MED ORDER — INSULIN GLARGINE 100 UNIT/ML ~~LOC~~ SOLN
10.0000 [IU] | Freq: Every day | SUBCUTANEOUS | Status: DC
  Administered 2019-10-28: 10 [IU] via SUBCUTANEOUS
  Filled 2019-10-28 (×2): qty 0.1

## 2019-10-28 MED ORDER — MIDAZOLAM HCL 5 MG/5ML IJ SOLN
INTRAMUSCULAR | Status: DC | PRN
  Administered 2019-10-28 (×2): 1 mg via INTRAVENOUS

## 2019-10-28 MED ORDER — LISINOPRIL 20 MG PO TABS
20.0000 mg | ORAL_TABLET | Freq: Every day | ORAL | Status: DC
  Administered 2019-10-29: 20 mg via ORAL
  Filled 2019-10-28: qty 1

## 2019-10-28 MED ORDER — TRANEXAMIC ACID-NACL 1000-0.7 MG/100ML-% IV SOLN
1000.0000 mg | Freq: Once | INTRAVENOUS | Status: AC
  Administered 2019-10-28: 1000 mg via INTRAVENOUS
  Filled 2019-10-28: qty 100

## 2019-10-28 MED ORDER — MENTHOL 3 MG MT LOZG: 1.0000 | LOZENGE | OROMUCOSAL | Status: DC | PRN

## 2019-10-28 MED ORDER — ACETAMINOPHEN 500 MG PO TABS
1000.0000 mg | ORAL_TABLET | Freq: Once | ORAL | Status: AC
  Administered 2019-10-28: 1000 mg via ORAL

## 2019-10-28 MED ORDER — ENOXAPARIN SODIUM 30 MG/0.3ML ~~LOC~~ SOLN
30.0000 mg | Freq: Two times a day (BID) | SUBCUTANEOUS | Status: DC
  Administered 2019-10-29: 30 mg via SUBCUTANEOUS
  Filled 2019-10-28: qty 0.3

## 2019-10-28 MED ORDER — METOCLOPRAMIDE HCL 5 MG PO TABS: 5.0000 mg | ORAL_TABLET | Freq: Three times a day (TID) | ORAL | Status: DC | PRN

## 2019-10-28 MED ORDER — PROPOFOL 10 MG/ML IV BOLUS
INTRAVENOUS | Status: AC
  Filled 2019-10-28: qty 20

## 2019-10-28 MED ORDER — 0.9 % SODIUM CHLORIDE (POUR BTL) OPTIME
TOPICAL | Status: DC | PRN
  Administered 2019-10-28: 1000 mL

## 2019-10-28 MED ORDER — ACETAMINOPHEN 325 MG PO TABS: 325.0000 mg | ORAL_TABLET | Freq: Four times a day (QID) | ORAL | Status: DC | PRN

## 2019-10-28 MED ORDER — PROPOFOL 500 MG/50ML IV EMUL
INTRAVENOUS | Status: AC
  Filled 2019-10-28: qty 50

## 2019-10-28 MED ORDER — POLYVINYL ALCOHOL-POVIDONE PF 1.4-0.6 % OP SOLN: 1.0000 [drp] | Freq: Every day | OPHTHALMIC | Status: DC | PRN

## 2019-10-28 MED ORDER — METHOCARBAMOL 500 MG IVPB - SIMPLE MED
500.0000 mg | Freq: Four times a day (QID) | INTRAVENOUS | Status: DC | PRN
  Filled 2019-10-28: qty 50

## 2019-10-28 MED ORDER — FENTANYL CITRATE (PF) 100 MCG/2ML IJ SOLN
INTRAMUSCULAR | Status: AC
  Filled 2019-10-28: qty 2

## 2019-10-28 MED ORDER — METFORMIN HCL 500 MG PO TABS
1000.0000 mg | ORAL_TABLET | Freq: Two times a day (BID) | ORAL | Status: DC
  Administered 2019-10-28 – 2019-10-29 (×2): 1000 mg via ORAL
  Filled 2019-10-28 (×2): qty 2

## 2019-10-28 MED ORDER — PROMETHAZINE HCL 25 MG/ML IJ SOLN: 6.2500 mg | INTRAMUSCULAR | Status: DC | PRN

## 2019-10-28 SURGICAL SUPPLY — 59 items
BAG SPEC THK2 15X12 ZIP CLS (MISCELLANEOUS)
BAG ZIPLOCK 12X15 (MISCELLANEOUS) IMPLANT
BLADE SAG 18X100X1.27 (BLADE) ×6 IMPLANT
BLADE SURG 15 STRL LF DISP TIS (BLADE) ×1 IMPLANT
BLADE SURG 15 STRL SS (BLADE) ×3
BLADE SURG SZ10 CARB STEEL (BLADE) ×8 IMPLANT
BNDG CMPR MED 10X6 ELC LF (GAUZE/BANDAGES/DRESSINGS) ×1
BNDG ELASTIC 6X10 VLCR STRL LF (GAUZE/BANDAGES/DRESSINGS) ×3 IMPLANT
BOWL SMART MIX CTS (DISPOSABLE) ×3 IMPLANT
BRNG TIB 0D 71/75X10 POST (Knees) ×1 IMPLANT
CEMENT BONE R 1X40 (Cement) ×6 IMPLANT
CLOSURE STERI-STRIP 1/2X4 (GAUZE/BANDAGES/DRESSINGS) ×2
CLSR STERI-STRIP ANTIMIC 1/2X4 (GAUZE/BANDAGES/DRESSINGS) ×4 IMPLANT
COMP FEMORAL VANGUARD 65MM (Knees) ×3 IMPLANT
COMPONENT FEMRL VANGUARD 65MM (Knees) IMPLANT
COVER SURGICAL LIGHT HANDLE (MISCELLANEOUS) ×3 IMPLANT
COVER WAND RF STERILE (DRAPES) IMPLANT
CUFF TOURN SGL QUICK 34 (TOURNIQUET CUFF) ×3
CUFF TRNQT CYL 34X4.125X (TOURNIQUET CUFF) ×1 IMPLANT
DECANTER SPIKE VIAL GLASS SM (MISCELLANEOUS) IMPLANT
DISTAL FEMORAL PEG (Knees) ×3 IMPLANT
DRAPE U-SHAPE 47X51 STRL (DRAPES) ×3 IMPLANT
DRSG MEPILEX BORDER 4X8 (GAUZE/BANDAGES/DRESSINGS) ×3 IMPLANT
DRSG PAD ABDOMINAL 8X10 ST (GAUZE/BANDAGES/DRESSINGS) ×3 IMPLANT
DURAPREP 26ML APPLICATOR (WOUND CARE) ×6 IMPLANT
ELECT REM PT RETURN 15FT ADLT (MISCELLANEOUS) ×3 IMPLANT
GLOVE BIO SURGEON STRL SZ7.5 (GLOVE) ×3 IMPLANT
GLOVE BIO SURGEON STRL SZ8 (GLOVE) ×3 IMPLANT
GLOVE BIOGEL PI IND STRL 8 (GLOVE) ×2 IMPLANT
GLOVE BIOGEL PI INDICATOR 8 (GLOVE) ×4
GLOVE SURG SS PI 7.0 STRL IVOR (GLOVE) ×2 IMPLANT
GOWN STRL REUS W/TWL 2XL LVL3 (GOWN DISPOSABLE) ×3 IMPLANT
GOWN STRL REUS W/TWL LRG LVL3 (GOWN DISPOSABLE) ×3 IMPLANT
HANDPIECE INTERPULSE COAX TIP (DISPOSABLE) ×3
HOLDER FOLEY CATH W/STRAP (MISCELLANEOUS) IMPLANT
HOOD PEEL AWAY FLYTE STAYCOOL (MISCELLANEOUS) ×9 IMPLANT
IMMOBILIZER KNEE 20 (SOFTGOODS) ×3
IMMOBILIZER KNEE 20 THIGH 36 (SOFTGOODS) ×1 IMPLANT
INSERT TIBIA BEAR 7/75 10 KNEE (Knees) ×2 IMPLANT
KIT TURNOVER KIT A (KITS) IMPLANT
MANIFOLD NEPTUNE II (INSTRUMENTS) ×3 IMPLANT
NS IRRIG 1000ML POUR BTL (IV SOLUTION) ×3 IMPLANT
PACK ICE MAXI GEL EZY WRAP (MISCELLANEOUS) ×3 IMPLANT
PACK TOTAL KNEE CUSTOM (KITS) ×3 IMPLANT
PATELLA SERIES A (Orthopedic Implant) ×2 IMPLANT
PEG FEMORAL DISTAL (Knees) IMPLANT
PENCIL SMOKE EVACUATOR (MISCELLANEOUS) ×2 IMPLANT
PIN STEINMANN THREADED TIP (PIN) ×2 IMPLANT
PLATE KNEE TIBIAL 71MM FIXED (Plate) ×2 IMPLANT
PROTECTOR NERVE ULNAR (MISCELLANEOUS) ×3 IMPLANT
SET HNDPC FAN SPRY TIP SCT (DISPOSABLE) ×1 IMPLANT
SUT MNCRL AB 3-0 PS2 18 (SUTURE) ×2 IMPLANT
SUT VIC AB 0 CT1 36 (SUTURE) ×3 IMPLANT
SUT VIC AB 2-0 CT1 27 (SUTURE) ×3
SUT VIC AB 2-0 CT1 TAPERPNT 27 (SUTURE) ×1 IMPLANT
SUT VIC AB 3-0 SH 8-18 (SUTURE) ×3 IMPLANT
TRAY FOLEY MTR SLVR 14FR STAT (SET/KITS/TRAYS/PACK) ×2 IMPLANT
WATER STERILE IRR 1000ML POUR (IV SOLUTION) ×6 IMPLANT
WRAP KNEE MAXI GEL POST OP (GAUZE/BANDAGES/DRESSINGS) ×2 IMPLANT

## 2019-10-28 NOTE — Op Note (Signed)
DATE OF SURGERY:  10/28/2019 TIME: 10:05 AM  PATIENT NAME:  Candace Diaz   AGE: 63 y.o.    PRE-OPERATIVE DIAGNOSIS: Right knee primary localized valgus osteoarthritis  POST-OPERATIVE DIAGNOSIS:  Same  PROCEDURE:  Total Knee Arthroplasty  SURGEON:  Johnny Bridge, MD   ASSISTANT: Chriss Czar, PA-C, present and scrubbed throughout the case, critical for assistance with exposure, retraction, instrumentation, and closure.   OPERATIVE IMPLANTS: Biomet Vanguard Fixed Bearing Posterior Stabilized Femur size 65, Tibia size 71, Patella size 31 3-peg oval button, with a 10 mm polyethylene insert.   PREOPERATIVE INDICATIONS:  Candace Diaz is a 63 y.o. year old female with end stage bone on bone degenerative arthritis of the knee who failed conservative treatment, including injections, antiinflammatories, activity modification, and assistive devices, and had significant impairment of their activities of daily living, and elected for Total Knee Arthroplasty.   The risks, benefits, and alternatives were discussed at length including but not limited to the risks of infection, bleeding, nerve injury, stiffness, blood clots, the need for revision surgery, cardiopulmonary complications, among others, and they were willing to proceed.  OPERATIVE FINDINGS AND UNIQUE ASPECTS OF THE CASE: There was significant hypertrophic osteophyte formation and extreme valgus configuration of the distal femur.  I had to cut the distal femur at 11, because the 9 did not get any contact with the lateral condyle.  I also externally rotated the femoral component to 5 degrees, in order to accommodate for lateral femoral condyle hypoplasia.  The lateral chamfer cut was fairly sclerotic, and I suspect was kicked up just a little bit, although I had nearly full contact throughout the femoral prosthesis, although during the impaction of the trial I suspect there was a small posterior osteophyte off of the back of the medial  femoral condyle that was mobilized, but the femur was stable.  I did drop to the tourniquet after doing the tibial cut, and removing the lateral meniscus, because it had some venous bleeding from the back that I was concerned about, but with release of the tourniquet there was no significant extravasation or bleeding.  I did elevate the tourniquet after this for the remainder of the case until closure.  ESTIMATED BLOOD LOSS: 100 mL  OPERATIVE DESCRIPTION:  The patient was brought to the operative room and placed in a supine position.  Spinal anesthesia was administered.  IV antibiotics were given.  The lower extremity was prepped and draped in the usual sterile fashion.  Time out was performed.  The leg was elevated and exsanguinated and the tourniquet was inflated.  Anterior quadriceps tendon splitting approach was performed.  The patella was everted and osteophytes were removed.  The anterior horn of the medial and lateral meniscus was removed.   The distal femur was opened with the drill and the intramedullary distal femoral cutting jig was utilized, set at 5 degrees resecting 11 mm off the distal femur.  Care was taken to protect the collateral ligaments.  Then the extramedullary tibial cutting jig was utilized making the appropriate cut using the anterior tibial crest as a reference building in appropriate posterior slope.  Care was taken during the cut to protect the medial and collateral ligaments.  The proximal tibia was removed along with the posterior horns of the menisci.  The PCL was sacrificed.    The extensor gap was measured and was approximately 24mm.  I remove the medial and lateral meniscus, and I was a little concerned about some venous bleeding  from the posterior capsule, so I dropped the tourniquet, confirmed no significant vessel injury, and then reinflated the tourniquet and proceeded with the remaining portion of the case.  The distal femoral sizing jig was applied, taking  care to avoid notching.  Then the 4-in-1 cutting jig was applied and the anterior and posterior femur was cut, along with the chamfer cuts.  All posterior osteophytes were removed.  The flexion gap was then measured and was symmetric with the extension gap.  I completed the distal femoral preparation using the appropriate jig to prepare the box.  The patella was then measured, and cut with the saw.  The thickness before the cut was 20 and after the cut was 14.  The proximal tibia sized and prepared accordingly with the reamer and the punch, and then all components were trialed with the 51mm poly insert.  The knee was found to have excellent balance and full motion.    The above named components were then cemented into place and all excess cement was removed.  The real polyethylene implant was placed.  After the cement had cured I released the tourniquet and confirmed excellent hemostasis with no major posterior vessel injury.    The knee was easily taken through a range of motion and the patella tracked well and the knee irrigated copiously and the parapatellar and subcutaneous tissue closed with vicryl, and monocryl with steri strips for the skin.  The wounds were injected with marcaine, and dressed with sterile gauze and the patient was awakened and returned to the PACU in stable and satisfactory condition.  There were no complications.  Total tourniquet time was 110 minutes.

## 2019-10-28 NOTE — H&P (Signed)
PREOPERATIVE H&P  Chief Complaint: right knee pain  HPI: Candace Diaz is a 63 y.o. female who presents for preoperative history and physical with a diagnosis of right knee valgus osteoarthritis. Symptoms are rated as moderate to severe, and have been worsening.  This is significantly impairing activities of daily living.  She has elected for surgical management.   She has failed injections, activity modification, anti-inflammatories, and assistive devices.  Preoperative X-rays demonstrate end stage degenerative changes with osteophyte formation, loss of joint space, subchondral sclerosis.   Past Medical History:  Diagnosis Date  . Chronic pain of right knee   . Diabetes mellitus    type 2   . Hypertension    Past Surgical History:  Procedure Laterality Date  . BREAST BIOPSY Left unsure  . CATARACT EXTRACTION Right   . surgery on ears   60 or 63 yo   " they did surgery to make me hear better but it just made it worse"   . TONSILLECTOMY  childhood   Social History   Socioeconomic History  . Marital status: Single    Spouse name: Not on file  . Number of children: Not on file  . Years of education: Not on file  . Highest education level: Not on file  Occupational History  . Not on file  Social Needs  . Financial resource strain: Not on file  . Food insecurity    Worry: Not on file    Inability: Not on file  . Transportation needs    Medical: Not on file    Non-medical: Not on file  Tobacco Use  . Smoking status: Former Smoker    Quit date: 2000    Years since quitting: 20.8  . Smokeless tobacco: Never Used  Substance and Sexual Activity  . Alcohol use: No  . Drug use: No  . Sexual activity: Not on file  Lifestyle  . Physical activity    Days per week: Not on file    Minutes per session: Not on file  . Stress: Not on file  Relationships  . Social Herbalist on phone: Not on file    Gets together: Not on file    Attends religious service: Not on  file    Active member of club or organization: Not on file    Attends meetings of clubs or organizations: Not on file    Relationship status: Not on file  Other Topics Concern  . Not on file  Social History Narrative  . Not on file   Family History  Problem Relation Age of Onset  . Diabetes Brother   . Hypertension Brother   . Diabetes Other   . Hypertension Other    Allergies  Allergen Reactions  . Aspirin Other (See Comments)    Internal bleeding  . Penicillins Hives    Did it involve swelling of the face/tongue/throat, SOB, or low BP? Unknown Did it involve sudden or severe rash/hives, skin peeling, or any reaction on the inside of your mouth or nose? Unknown Did you need to seek medical attention at a hospital or doctor's office? Unknown When did it last happen?years If all above answers are "NO", may proceed with cephalosporin use.   Prior to Admission medications   Medication Sig Start Date End Date Taking? Authorizing Provider  ACCU-CHEK AVIVA PLUS test strip USE TO CHECK BLOOD SUGAR TWICE DAILY 09/20/16  Yes Micheline Chapman, NP  acetaminophen (TYLENOL) 500 MG tablet Take 1,000 mg  by mouth every 6 (six) hours as needed for moderate pain or headache.   Yes [provider]  albuterol (PROAIR HFA) 108 (90 Base) MCG/ACT inhaler INHALE 2 PUFFS BY MOUTH FOUR TIMES A DAY AS NEEDED FOR SHORTNESS OF BREATH. Patient taking differently: Inhale 2 puffs into the lungs every 4 (four) hours as needed for wheezing or shortness of breath.  02/24/19  Yes Kallie Locks, FNP  amLODipine (NORVASC) 10 MG tablet Take 1 tablet (10 mg total) by mouth daily. 03/18/19  Yes Kallie Locks, FNP  cholecalciferol (VITAMIN D3) 25 MCG (1000 UT) tablet Take 2,000 Units by mouth daily.   Yes [provider]  EASY TOUCH INSULIN SYRINGE 31G X 5/16" 0.5 ML MISC USE TO INJECT INSULIN EVERY DAY 10/18/16  Yes Henrietta Hoover, NP  hydrochlorothiazide (HYDRODIURIL) 25 MG tablet TAKE  1 TABLET BY MOUTH IN THE MORNING Patient taking differently: Take 25 mg by mouth daily.  02/27/19  Yes Kallie Locks, FNP  Insulin Glargine (LANTUS SOLOSTAR) 100 UNIT/ML Solostar Pen Inject 10 Units into the skin daily at 10 pm. 01/02/19  Yes Kallie Locks, FNP  Insulin Pen Needle (NOVOFINE) 30G X 8 MM MISC Administer insulin subcutaneously once daily. ICD 10 E.11 03/26/19  Yes Kallie Locks, FNP  JANUVIA 25 MG tablet Take 1 tablet by mouth once daily Patient taking differently: Take 25 mg by mouth daily.  09/24/19  Yes Kallie Locks, FNP  Lancets (ACCU-CHEK MULTICLIX) lancets USE TO CHECK BLOOD SUGAR TWICE DAILY 12/24/15  Yes Henrietta Hoover, NP  Lancets (ACCU-CHEK MULTICLIX) lancets USE TO CHECK BLOOD SUGAR TWICE DAILY 03/13/17  Yes Massie Maroon, FNP  lisinopril (ZESTRIL) 20 MG tablet Take 1 tablet (20 mg total) by mouth daily. 06/05/19  Yes Kallie Locks, FNP  metFORMIN (GLUCOPHAGE) 1000 MG tablet TAKE 1 TABLET BY MOUTH TWICE DAILY WITH A MEAL Patient taking differently: Take 1,000 mg by mouth 2 (two) times daily.  02/24/19  Yes Kallie Locks, FNP  Multiple Vitamins-Minerals (MULTIVITAMIN WITH MINERALS) tablet Take 2 tablets by mouth daily.    Yes [provider]  polyethylene glycol (MIRALAX / GLYCOLAX) 17 g packet Take 17 g by mouth daily as needed for mild constipation.   Yes [provider]  Polyvinyl Alcohol-Povidone (REFRESH OP) Place 1 drop into both eyes daily as needed (dry eyes).   Yes [provider]  simvastatin (ZOCOR) 10 MG tablet TAKE 1 TABLET BY MOUTH ONCE DAILY IN THE EVENING Patient taking differently: Take 10 mg by mouth every evening.  09/24/19  Yes Kallie Locks, FNP     Positive ROS: All other systems have been reviewed and were otherwise negative with the exception of those mentioned in the HPI and as above.  Physical Exam: General: Alert, no acute distress Cardiovascular: No pedal edema Respiratory: No cyanosis, no  use of accessory musculature GI: No organomegaly, abdomen is soft and non-tender Skin: No lesions in the area of chief complaint Neurologic: Sensation intact distally Psychiatric: Patient is competent for consent with normal mood and affect Lymphatic: No axillary or cervical lymphadenopathy  MUSCULOSKELETAL: right knee with valgus alignment and crepitance, painful arc of motion  Assessment: Right knee valgus osteoarthritis   Plan: Plan for Procedure(s): TOTAL KNEE ARTHROPLASTY  The risks benefits and alternatives were discussed with the patient including but not limited to the risks of nonoperative treatment, versus surgical intervention including infection, bleeding, nerve injury,  blood clots, cardiopulmonary complications, morbidity, mortality,  among others, and they were willing to proceed.   Anticipated LOS equal to or greater than 2 midnights due to - Age 10265 and older with one or more of the following:  - Obesity  - Expected need for hospital services (PT, OT, Nursing) required for safe  discharge  - Anticipated need for postoperative skilled nursing care or inpatient rehab  - Active co-morbidities: None    Eulas PostJoshua P Izic Stfort, MD Cell [phone]   10/28/2019 7:22 AM

## 2019-10-28 NOTE — Anesthesia Postprocedure Evaluation (Signed)
Anesthesia Post Note  Patient: Candace Diaz  Procedure(s) Performed: TOTAL KNEE ARTHROPLASTY (Right Knee)     Patient location during evaluation: PACU Anesthesia Type: Spinal Level of consciousness: oriented and awake and alert Pain management: pain level controlled Vital Signs Assessment: post-procedure vital signs reviewed and stable Respiratory status: spontaneous breathing, respiratory function stable and patient connected to nasal cannula oxygen Cardiovascular status: blood pressure returned to baseline and stable Postop Assessment: no headache, no backache and no apparent nausea or vomiting Anesthetic complications: no    Last Vitals:  Vitals:   10/28/19 1126 10/28/19 1232  BP: 115/84 117/81  Pulse: 62 (!) 55  Resp: 14   Temp: (!) 36.3 C (!) 36.3 C  SpO2: 99% 100%    Last Pain:  Vitals:   10/28/19 1126  TempSrc:   PainSc: 0-No pain                 Brookes Craine L Harland Aguiniga

## 2019-10-28 NOTE — Transfer of Care (Signed)
Immediate Anesthesia Transfer of Care Note  Patient: Candace Diaz  Procedure(s) Performed: TOTAL KNEE ARTHROPLASTY (Right Knee)  Patient Location: PACU  Anesthesia Type:Spinal  Level of Consciousness: sedated, patient cooperative and responds to stimulation  Airway & Oxygen Therapy: Patient Spontanous Breathing and Patient connected to face mask oxygen  Post-op Assessment: Report given to RN and Post -op Vital signs reviewed and stable  Post vital signs: Reviewed and stable  Last Vitals:  Vitals Value Taken Time  BP 92/71 10/28/19 1025  Temp    Pulse 67 10/28/19 1028  Resp 16 10/28/19 1028  SpO2 100 % 10/28/19 1028  Vitals shown include unvalidated device data.  Last Pain:  Vitals:   10/28/19 0621  TempSrc: Oral      Patients Stated Pain Goal: 3 (93/[id number])  Complications: No apparent anesthesia complications

## 2019-10-28 NOTE — Anesthesia Procedure Notes (Signed)
Anesthesia Regional Block: Adductor canal block   Pre-Anesthetic Checklist: ,, timeout performed, Correct Patient, Correct Site, Correct Laterality, Correct Procedure, Correct Position, site marked, Risks and benefits discussed, pre-op evaluation,  At surgeon's request and post-op pain management  Laterality: Right  Prep: Maximum Sterile Barrier Precautions used, chloraprep       Needles:  Injection technique: Single-shot  Needle Type: Echogenic Stimulator Needle     Needle Length: 9cm  Needle Gauge: 22     Additional Needles:   Procedures:,,,, ultrasound used (permanent image in chart),,,,  Narrative:  Start time: 10/28/2019 7:11 AM End time: 10/28/2019 7:13 AM Injection made incrementally with aspirations every 5 mL.  Performed by: Personally  Anesthesiologist: Brennan Bailey, MD  Additional Notes: Risks, benefits, and alternative discussed. Patient gave consent for procedure. Patient prepped and draped in sterile fashion. Sedation administered, patient remains easily responsive to voice. Relevant anatomy identified with ultrasound guidance. Local anesthetic given in 5cc increments with no signs or symptoms of intravascular injection. No pain or paraesthesias with injection. Patient monitored throughout procedure with signs of LAST or immediate complications. Tolerated well. Ultrasound image placed in chart.  Tawny Asal, MD

## 2019-10-28 NOTE — Discharge Instructions (Signed)

## 2019-10-28 NOTE — Anesthesia Procedure Notes (Signed)
Spinal  Start time: 10/28/2019 7:28 AM End time: 10/28/2019 7:33 AM Staffing Resident/CRNA: Gean Maidens, CRNA Performed: resident/CRNA  Preanesthetic Checklist Completed: patient identified, site marked, surgical consent, pre-op evaluation, timeout performed, IV checked, risks and benefits discussed and monitors and equipment checked Spinal Block Patient position: sitting Prep: DuraPrep Patient monitoring: heart rate, continuous pulse ox and blood pressure Approach: midline Location: L3-4 Injection technique: single-shot Needle Needle type: Pencan  Needle gauge: 24 G Needle length: 9 cm Needle insertion depth: 8 cm Additional Notes Pt sitting position, sterile prep and drape, negative paresthesia/heme

## 2019-10-28 NOTE — Evaluation (Signed)
Physical Therapy Evaluation Patient Details Name: Candace Diaz MRN: [id number] DOB: October 29, 1956 Today's Date: 10/28/2019   History of Present Illness  R TKA; PMH of DM, HTN  Clinical Impression  Pt is s/p TKA resulting in the deficits listed below (see PT Problem List). Pt ambulated 68' with RW, initiated TKA HEP.  Good progress expected.   Pt will benefit from skilled PT to increase their independence and safety with mobility to allow discharge to the venue listed below.  .       Follow Up Recommendations Follow surgeon's recommendation for DC plan and follow-up therapies    Equipment Recommendations  Rolling walker with 5" wheels;3in1 (PT)    Recommendations for Other Services       Precautions / Restrictions Precautions Precautions: Knee Restrictions Weight Bearing Restrictions: No Other Position/Activity Restrictions: WBAT      Mobility  Bed Mobility Overal bed mobility: Modified Independent             General bed mobility comments: HOB up  Transfers Overall transfer level: Needs assistance Equipment used: Rolling walker (2 wheeled) Transfers: Sit to/from Stand Sit to Stand: Min assist;From elevated surface         General transfer comment: VCs hand placement, min A to rise  Ambulation/Gait Ambulation/Gait assistance: Min guard Gait Distance (Feet): 50 Feet Assistive device: Rolling walker (2 wheeled) Gait Pattern/deviations: Step-to pattern;Decreased stride length;Trunk flexed Gait velocity: decr   General Gait Details: VCs for posture and sequencing, no loss of balance  Stairs            Wheelchair Mobility    Modified Rankin (Stroke Patients Only)       Balance Overall balance assessment: Modified Independent                                           Pertinent Vitals/Pain Pain Assessment: 0-10 Pain Score: 2  Pain Location: R knee Pain Descriptors / Indicators: Sore Pain Intervention(s): Limited activity  within patient's tolerance;Monitored during session;Premedicated before session;Ice applied    Home Living Family/patient expects to be discharged to:: Private residence Living Arrangements: Children Available Help at Discharge: Family;Available PRN/intermittently Type of Home: Apartment Home Access: Level entry     Home Layout: One level Home Equipment: Cane - single point      Prior Function Level of Independence: Independent with assistive device(s)         Comments: independent with cane; daughter to assist pt, daughter works night shift     Hand Dominance        Extremity/Trunk Assessment   Upper Extremity Assessment Upper Extremity Assessment: Overall WFL for tasks assessed    Lower Extremity Assessment Lower Extremity Assessment: RLE deficits/detail RLE Deficits / Details: SLR 3/5, knee ext at least 3/5; knee AAROM 5-50* RLE Sensation: WNL    Cervical / Trunk Assessment Cervical / Trunk Assessment: Normal  Communication   Communication: HOH(reads lips)  Cognition Arousal/Alertness: Awake/alert Behavior During Therapy: WFL for tasks assessed/performed Overall Cognitive Status: Within Functional Limits for tasks assessed                                        General Comments      Exercises Total Joint Exercises Ankle Circles/Pumps: AROM;Both;10 reps;Supine Heel Slides: AAROM;Right;5 reps;Supine Long Arc Quad:  AROM;Right;5 reps;Seated Goniometric ROM: 5-50* AAROM R knee   Assessment/Plan    PT Assessment Patient needs continued PT services  PT Problem List Decreased strength;Decreased range of motion;Decreased activity tolerance;Decreased mobility;Pain;Decreased knowledge of use of DME       PT Treatment Interventions Gait training;Functional mobility training;Therapeutic exercise;Therapeutic activities;Patient/family education;DME instruction    PT Goals (Current goals can be found in the Care Plan section)  Acute Rehab PT  Goals Patient Stated Goal: to walk without AD PT Goal Formulation: With patient Time For Goal Achievement: 11/04/19 Potential to Achieve Goals: Good    Frequency 7X/week   Barriers to discharge        Co-evaluation               AM-PAC PT "6 Clicks" Mobility  Outcome Measure Help needed turning from your back to your side while in a flat bed without using bedrails?: A Little Help needed moving from lying on your back to sitting on the side of a flat bed without using bedrails?: A Little Help needed moving to and from a bed to a chair (including a wheelchair)?: A Little Help needed standing up from a chair using your arms (e.g., wheelchair or bedside chair)?: A Little Help needed to walk in hospital room?: A Little Help needed climbing 3-5 steps with a railing? : A Lot 6 Click Score: 17    End of Session Equipment Utilized During Treatment: Gait belt Activity Tolerance: Patient tolerated treatment well Patient left: in chair;with call bell/phone within reach;with chair alarm set Nurse Communication: Mobility status PT Visit Diagnosis: Muscle weakness (generalized) (M62.81);Difficulty in walking, not elsewhere classified (R26.2);Pain Pain - Right/Left: Right Pain - part of body: Knee    Time: 1540-1601 PT Time Calculation (min) (ACUTE ONLY): 21 min   Charges:   PT Evaluation $PT Eval Low Complexity: 1 Low          Tamala Ser PT 10/28/2019  Acute Rehabilitation Services Pager [phone] Office [phone]

## 2019-10-29 ENCOUNTER — Other Ambulatory Visit: Payer: Self-pay

## 2019-10-29 ENCOUNTER — Encounter (HOSPITAL_COMMUNITY): Payer: Self-pay | Admitting: Orthopedic Surgery

## 2019-10-29 DIAGNOSIS — Z96651 Presence of right artificial knee joint: Secondary | ICD-10-CM | POA: Diagnosis not present

## 2019-10-29 DIAGNOSIS — M1711 Unilateral primary osteoarthritis, right knee: Secondary | ICD-10-CM | POA: Diagnosis not present

## 2019-10-29 LAB — BASIC METABOLIC PANEL
Anion gap: 11 (ref 5–15)
BUN: 18 mg/dL (ref 8–23)
CO2: 25 mmol/L (ref 22–32)
Calcium: 8.2 mg/dL — ABNORMAL LOW (ref 8.9–10.3)
Chloride: 104 mmol/L (ref 98–111)
Creatinine, Ser: 0.79 mg/dL (ref 0.44–1.00)
GFR calc Af Amer: >60 mL/min (ref >60)
GFR calc non Af Amer: >60 mL/min (ref >60)
Glucose, Bld: 106 mg/dL — ABNORMAL HIGH (ref 70–99)
Potassium: 3.6 mmol/L (ref 3.5–5.1)
Sodium: 140 mmol/L (ref 135–145)

## 2019-10-29 LAB — CBC
HCT: 29.7 % — ABNORMAL LOW (ref 36.0–46.0)
Hemoglobin: 9.2 g/dL — ABNORMAL LOW (ref 12.0–15.0)
MCH: 28.8 pg (ref 26.0–34.0)
MCHC: 31 g/dL (ref 30.0–36.0)
MCV: 93.1 fL (ref 80.0–100.0)
Platelets: 250 10*3/uL (ref 150–400)
RBC: 3.19 MIL/uL — ABNORMAL LOW (ref 3.87–5.11)
RDW: 12.8 % (ref 11.5–15.5)
WBC: 10.9 10*3/uL — ABNORMAL HIGH (ref 4.0–10.5)
nRBC: 0 % (ref 0.0–0.2)

## 2019-10-29 LAB — GLUCOSE, CAPILLARY
Glucose-Capillary: 79 mg/dL (ref 70–99)
Glucose-Capillary: 115 mg/dL — ABNORMAL HIGH (ref 70–99)

## 2019-10-29 MED ORDER — ENOXAPARIN (LOVENOX) PATIENT EDUCATION KIT
PACK | Freq: Once | Status: DC
  Filled 2019-10-29: qty 1

## 2019-10-29 NOTE — Discharge Summary (Signed)
Physician Discharge Summary  Patient ID: Candace Diaz MRN: [id number] DOB/AGE: 08-21-1956 63 y.o.  Admit date: 10/28/2019 Discharge date: 10/29/2019  Admission Diagnoses:  Osteoarthritis of right knee  Discharge Diagnoses:  Principal Problem:   Osteoarthritis of right knee Active Problems:   S/P TKR (total knee replacement) using cement, right   Past Medical History:  Diagnosis Date  . Chronic pain of right knee   . Diabetes mellitus    type 2   . Hypertension     Surgeries: Procedure(s): TOTAL KNEE ARTHROPLASTY on 10/28/2019   Consultants (if any):   Discharged Condition: Improved  Hospital Course: Candace Diaz is an 63 y.o. female who was admitted 10/28/2019 with a diagnosis of Osteoarthritis of right knee and went to the operating room on 10/28/2019 and underwent the above named procedures.    She was given perioperative antibiotics:  Anti-infectives (From admission, onward)   Start     Dose/Rate Route Frequency Ordered Stop   10/28/19 1400  ceFAZolin (ANCEF) IVPB 2g/100 mL premix     2 g 200 mL/hr over 30 Minutes Intravenous Every 6 hours 10/28/19 1200 10/29/19 0001   10/28/19 0600  ceFAZolin (ANCEF) IVPB 2g/100 mL premix     2 g 200 mL/hr over 30 Minutes Intravenous On call to O.R. 10/28/19 6759 10/28/19 0815    .  She was given sequential compression devices, early ambulation, and lovenox for DVT prophylaxis.  She benefited maximally from the hospital stay and there were no complications.    Recent vital signs:  Vitals:   10/29/19 0822 10/29/19 0925  BP: (!) 126/92 133/82  Pulse: 63 76  Resp: 15   Temp: 98.1 F (36.7 C)   SpO2: 100% 100%    Recent laboratory studies:  Lab Results  Component Value Date   HGB 9.2 (L) 10/29/2019   HGB 11.3 (L) 10/28/2019   HGB 12.0 10/22/2019   Lab Results  Component Value Date   WBC 10.9 (H) 10/29/2019   PLT 250 10/29/2019   No results found for: INR Lab Results  Component Value Date   NA 140  10/29/2019   K 3.6 10/29/2019   CL 104 10/29/2019   CO2 25 10/29/2019   BUN 18 10/29/2019   CREATININE 0.79 10/29/2019   GLUCOSE 106 (H) 10/29/2019    Discharge Medications:   Allergies as of 10/29/2019      Reactions   Aspirin Other (See Comments)   Internal bleeding   Penicillins Hives   Did it involve swelling of the face/tongue/throat, SOB, or low BP? Unknown Did it involve sudden or severe rash/hives, skin peeling, or any reaction on the inside of your mouth or nose? Unknown Did you need to seek medical attention at a hospital or doctor's office? Unknown When did it last happen?years If all above answers are "NO", may proceed with cephalosporin use.      Medication List    TAKE these medications   Accu-Chek Aviva Plus test strip Generic drug: glucose blood USE TO CHECK BLOOD SUGAR TWICE DAILY   accu-chek multiclix lancets USE TO CHECK BLOOD SUGAR TWICE DAILY   accu-chek multiclix lancets USE TO CHECK BLOOD SUGAR TWICE DAILY   acetaminophen 500 MG tablet Commonly known as: TYLENOL Take 1,000 mg by mouth every 6 (six) hours as needed for moderate pain or headache.   albuterol 108 (90 Base) MCG/ACT inhaler Commonly known as: ProAir HFA INHALE 2 PUFFS BY MOUTH FOUR TIMES A DAY AS NEEDED FOR SHORTNESS OF BREATH.  What changed:   how much to take  how to take this  when to take this  reasons to take this  additional instructions   amLODipine 10 MG tablet Commonly known as: NORVASC Take 1 tablet (10 mg total) by mouth daily.   baclofen 10 MG tablet Commonly known as: LIORESAL Take 1 tablet (10 mg total) by mouth 3 (three) times daily. As needed for muscle spasm   cholecalciferol 25 MCG (1000 UT) tablet Commonly known as: VITAMIN D3 Take 2,000 Units by mouth daily.   Easy Touch Insulin Syringe 31G X 5/16" 0.5 ML Misc Generic drug: Insulin Syringe-Needle U-100 USE TO INJECT INSULIN EVERY DAY   enoxaparin 30 MG/0.3ML injection Commonly known as:  Lovenox Inject 0.3 mLs (30 mg total) into the skin every 12 (twelve) hours.   hydrochlorothiazide 25 MG tablet Commonly known as: HYDRODIURIL TAKE 1 TABLET BY MOUTH IN THE MORNING What changed: when to take this   Insulin Glargine 100 UNIT/ML Solostar Pen Commonly known as: Lantus SoloStar Inject 10 Units into the skin daily at 10 pm.   Insulin Pen Needle 30G X 8 MM Misc Commonly known as: NOVOFINE Administer insulin subcutaneously once daily. ICD 10 E.11   Januvia 25 MG tablet Generic drug: sitaGLIPtin Take 1 tablet by mouth once daily What changed: how much to take   lisinopril 20 MG tablet Commonly known as: ZESTRIL Take 1 tablet (20 mg total) by mouth daily.   metFORMIN 1000 MG tablet Commonly known as: GLUCOPHAGE TAKE 1 TABLET BY MOUTH TWICE DAILY WITH A MEAL What changed:   how much to take  how to take this  when to take this  additional instructions   multivitamin with minerals tablet Take 2 tablets by mouth daily.   ondansetron 4 MG tablet Commonly known as: Zofran Take 1 tablet (4 mg total) by mouth every 8 (eight) hours as needed for nausea or vomiting.   oxyCODONE 5 MG immediate release tablet Commonly known as: Roxicodone Take 1 tablet (5 mg total) by mouth every 4 (four) hours as needed for severe pain.   polyethylene glycol 17 g packet Commonly known as: MIRALAX / GLYCOLAX Take 17 g by mouth daily as needed for mild constipation.   REFRESH OP Place 1 drop into both eyes daily as needed (dry eyes).   sennosides-docusate sodium 8.6-50 MG tablet Commonly known as: SENOKOT-S Take 2 tablets by mouth daily.   simvastatin 10 MG tablet Commonly known as: ZOCOR TAKE 1 TABLET BY MOUTH ONCE DAILY IN THE EVENING       Diagnostic Studies: Dg Knee Right Port  Result Date: 10/28/2019 CLINICAL DATA:  Right total knee replacement. EXAM: PORTABLE RIGHT KNEE - 1-2 VIEW COMPARISON:  Right knee radiographs/25/18 FINDINGS: The patient is status post right  total knee arthroplasty. Femoral and tibial components are well seated. The knee is located. Effusion is present. IMPRESSION: 1. Status post right total knee arthroplasty without radiographic evidence for complication. Electronically Signed   By: San Morelle M.D.   On: 10/28/2019 11:01    Disposition:     Follow-up Information    Candace Bond, MD. Schedule an appointment as soon as possible for a visit in 2 weeks.   Specialty: Orthopedic Surgery Contact information: [address] [place] [address] [phone]            Signed: Johnny Bridge 10/29/2019, 9:26 AM

## 2019-10-29 NOTE — Progress Notes (Signed)
     Subjective:  Patient reports pain as mild.  Walked 54' yesterday.   Objective:   VITALS:   Vitals:   10/28/19 2248 10/29/19 0133 10/29/19 0530 10/29/19 0822  BP: 108/75 121/89 119/80 (!) 126/92  Pulse: 66 65 63 63  Resp: 16 18 16 15   Temp: 98.1 F (36.7 C) 98.3 F (36.8 C) 98.6 F (37 C) 98.1 F (36.7 C)  TempSrc: Oral Oral Oral   SpO2: 100% 98% 100% 100%  Weight:      Height:        Neurologically intact Dorsiflexion/Plantar flexion intact Incision: dressing C/D/I   Lab Results  Component Value Date   WBC 10.9 (H) 10/29/2019   HGB 9.2 (L) 10/29/2019   HCT 29.7 (L) 10/29/2019   MCV 93.1 10/29/2019   PLT 250 10/29/2019   BMET    Component Value Date/Time   NA 140 10/29/2019 0241   NA 143 04/11/2019 1354   K 3.6 10/29/2019 0241   CL 104 10/29/2019 0241   CO2 25 10/29/2019 0241   GLUCOSE 106 (H) 10/29/2019 0241   BUN 18 10/29/2019 0241   BUN 11 04/11/2019 1354   CREATININE 0.79 10/29/2019 0241   CREATININE 0.79 08/03/2017 1039   CALCIUM 8.2 (L) 10/29/2019 0241   GFRNONAA >60 10/29/2019 0241   GFRNONAA 82 08/03/2017 1039   GFRAA >60 10/29/2019 0241   GFRAA >89 08/03/2017 1039     Assessment/Plan: 1 Day Post-Op   Principal Problem:   Osteoarthritis of right knee Active Problems:   S/P TKR (total knee replacement) using cement, right   Advance diet Up with therapy Discharge today if passes PT, will change to obs.     Patient's anticipated LOS is less than 2 midnights, meeting these requirements: - Younger than 49 - Lives within 1 hour of care - Has a competent adult at home to recover with post-op recover - NO history of  - Chronic pain requiring opiods  - Diabetes  - Coronary Artery Disease  - Heart failure  - Heart attack  - Stroke  - DVT/VTE  - Cardiac arrhythmia  - Respiratory Failure/COPD  - Renal failure  - Anemia  - Advanced Liver disease        Johnny Bridge 10/29/2019, 9:23 AM   Marchia Bond, MD Cell  [phone]

## 2019-10-29 NOTE — TOC Transition Note (Signed)
Transition of Care [facility]) - CM/SW Discharge Note   Patient Details  Name: Candace Diaz MRN: [id number] Date of Birth: 1956-03-24  Transition of [facility]) CM/SW Contact:  Lia Hopping, LCSW Phone Number: 10/29/2019, 10:04 AM   Clinical Narrative:    Therapy Plan: [facility] DME ordered through [place] 3 in 1 and RW.    Final next level of care: [place] Barriers to Discharge: No Barriers Identified   Patient Goals and CMS Choice        Discharge Placement  Home                      Discharge Plan and Services                DME Arranged: 3-N-1, Walker rolling DME Agency: [employer] Date DME Agency Contacted: 10/29/19 Time DME Agency Contacted: 0900 Representative spoke with at DME Agency: Ovid Curd HH Arranged: PT [place]: [facility] ([facility])(Prearranged in Systems analyst) Date [place]: 10/29/19 Time [place]: 1003 Representative spoke with at [place]: [place]) Interventions     Readmission Risk Interventions No flowsheet data found.

## 2019-10-29 NOTE — Progress Notes (Signed)
Physical Therapy Treatment Patient Details Name: Candace Diaz MRN: [id number] DOB: 14-Jan-1956 Today's Date: 10/29/2019    History of Present Illness R TKA; PMH of DM, HTN    PT Comments    Pt ambulated 125' with RW, continuous verbal/manual cues for posture and sequencing. Reviewed HEP with pt. She is ready to DC home from PT standpoint.   Follow Up Recommendations  Follow surgeon's recommendation for DC plan and follow-up therapies     Equipment Recommendations  Rolling walker with 5" wheels;3in1 (PT)    Recommendations for Other Services       Precautions / Restrictions Precautions Precautions: Knee Precaution Comments: reviewed no pillow under knee Restrictions Weight Bearing Restrictions: No RLE Weight Bearing: Weight bearing as tolerated Other Position/Activity Restrictions: WBAT    Mobility  Bed Mobility               General bed mobility comments: up in recliner  Transfers Overall transfer level: Needs assistance Equipment used: Rolling walker (2 wheeled) Transfers: Sit to/from Stand Sit to Stand: Supervision         General transfer comment: VCs hand placement  Ambulation/Gait Ambulation/Gait assistance: Min guard;Supervision Gait Distance (Feet): 125 Feet Assistive device: Rolling walker (2 wheeled) Gait Pattern/deviations: Step-to pattern;Decreased stride length;Trunk flexed Gait velocity: decr   General Gait Details: continuous VCs for posture and sequencing and to roll rather than lift RW, no loss of balance, pt alternated between flexing and extending trunk while ambulating, she did not respond to verbal/manual cues to keep trunk erect   Stairs             Wheelchair Mobility    Modified Rankin (Stroke Patients Only)       Balance Overall balance assessment: Modified Independent                                          Cognition Arousal/Alertness: Awake/alert Behavior During Therapy: WFL for tasks  assessed/performed Overall Cognitive Status: Within Functional Limits for tasks assessed                                        Exercises Total Joint Exercises Ankle Circles/Pumps: AROM;Both;10 reps;Supine Quad Sets: AROM;Right;5 reps;Supine Short Arc Quad: AROM;Right;10 reps;Supine Heel Slides: AAROM;Right;Supine;10 reps Hip ABduction/ADduction: AROM;Right;10 reps;Supine Straight Leg Raises: AROM;Right;10 reps;Supine Long Arc Quad: AROM;Right;Seated;10 reps Knee Flexion: AAROM;Right;10 reps;Seated Goniometric ROM: 5-75* AAROM R knee    General Comments        Pertinent Vitals/Pain Pain Score: 7  Pain Location: R knee Pain Descriptors / Indicators: Sore Pain Intervention(s): Limited activity within patient's tolerance;Monitored during session;RN gave pain meds during session;Ice applied    Home Living                      Prior Function            PT Goals (current goals can now be found in the care plan section) Acute Rehab PT Goals Patient Stated Goal: to walk without AD PT Goal Formulation: With patient Time For Goal Achievement: 11/04/19 Potential to Achieve Goals: Good Progress towards PT goals: Progressing toward goals    Frequency    7X/week      PT Plan Current plan remains appropriate    Co-evaluation  AM-PAC PT "6 Clicks" Mobility   Outcome Measure  Help needed turning from your back to your side while in a flat bed without using bedrails?: A Little Help needed moving from lying on your back to sitting on the side of a flat bed without using bedrails?: A Little Help needed moving to and from a bed to a chair (including a wheelchair)?: A Little Help needed standing up from a chair using your arms (e.g., wheelchair or bedside chair)?: A Little Help needed to walk in hospital room?: A Little Help needed climbing 3-5 steps with a railing? : A Little 6 Click Score: 18    End of Session Equipment Utilized  During Treatment: Gait belt Activity Tolerance: Patient tolerated treatment well Patient left: in chair;with call bell/phone within reach;with chair alarm set Nurse Communication: Mobility status PT Visit Diagnosis: Muscle weakness (generalized) (M62.81);Difficulty in walking, not elsewhere classified (R26.2);Pain Pain - Right/Left: Right Pain - part of body: Knee     Time: 6979-4801 PT Time Calculation (min) (ACUTE ONLY): 34 min  Charges:  $Gait Training: 8-22 mins $Therapeutic Exercise: 8-22 mins                    Blondell Reveal Kistler PT 10/29/2019  Acute Rehabilitation Services Pager [phone] Office [phone]

## 2019-10-30 DIAGNOSIS — Z471 Aftercare following joint replacement surgery: Secondary | ICD-10-CM | POA: Diagnosis not present

## 2019-11-03 ENCOUNTER — Other Ambulatory Visit: Payer: Self-pay | Admitting: Family Medicine

## 2019-11-03 ENCOUNTER — Other Ambulatory Visit: Payer: Self-pay

## 2019-11-03 DIAGNOSIS — I10 Essential (primary) hypertension: Secondary | ICD-10-CM

## 2019-11-03 DIAGNOSIS — E785 Hyperlipidemia, unspecified: Secondary | ICD-10-CM

## 2019-11-03 MED ORDER — LISINOPRIL 20 MG PO TABS: 20.0000 mg | ORAL_TABLET | Freq: Every day | ORAL | 2 refills | Status: DC

## 2019-11-04 DIAGNOSIS — Z471 Aftercare following joint replacement surgery: Secondary | ICD-10-CM | POA: Diagnosis not present

## 2019-11-05 DIAGNOSIS — Z471 Aftercare following joint replacement surgery: Secondary | ICD-10-CM | POA: Diagnosis not present

## 2019-11-06 ENCOUNTER — Telehealth: Payer: Self-pay | Admitting: Family Medicine

## 2019-11-06 ENCOUNTER — Telehealth: Payer: Self-pay

## 2019-11-06 ENCOUNTER — Other Ambulatory Visit: Payer: Self-pay | Admitting: Family Medicine

## 2019-11-06 DIAGNOSIS — I10 Essential (primary) hypertension: Secondary | ICD-10-CM

## 2019-11-06 MED ORDER — HYDROCHLOROTHIAZIDE 25 MG PO TABS: 25.0000 mg | ORAL_TABLET | Freq: Every day | ORAL | 1 refills | Status: DC

## 2019-11-06 NOTE — Telephone Encounter (Signed)
done

## 2019-11-06 NOTE — Telephone Encounter (Signed)
Patient informed Lisnopril is ready or pick up at [facility].

## 2019-11-06 NOTE — Telephone Encounter (Signed)
Confirmed with Rx was already available. Patient has been re-informed about pick-up.

## 2019-11-07 DIAGNOSIS — Z471 Aftercare following joint replacement surgery: Secondary | ICD-10-CM | POA: Diagnosis not present

## 2019-11-10 DIAGNOSIS — M1711 Unilateral primary osteoarthritis, right knee: Secondary | ICD-10-CM | POA: Diagnosis not present

## 2019-12-02 ENCOUNTER — Ambulatory Visit: Payer: Medicaid Other | Attending: Orthopedic Surgery | Admitting: Physical Therapy

## 2019-12-02 ENCOUNTER — Encounter: Payer: Self-pay | Admitting: Physical Therapy

## 2019-12-02 ENCOUNTER — Other Ambulatory Visit: Payer: Self-pay

## 2019-12-02 DIAGNOSIS — M25661 Stiffness of right knee, not elsewhere classified: Secondary | ICD-10-CM | POA: Diagnosis not present

## 2019-12-02 DIAGNOSIS — R531 Weakness: Secondary | ICD-10-CM | POA: Insufficient documentation

## 2019-12-02 DIAGNOSIS — R601 Generalized edema: Secondary | ICD-10-CM | POA: Diagnosis not present

## 2019-12-02 DIAGNOSIS — R2689 Other abnormalities of gait and mobility: Secondary | ICD-10-CM | POA: Diagnosis not present

## 2019-12-02 NOTE — Patient Instructions (Signed)
Recommended pt to continue with current HEP for now

## 2019-12-02 NOTE — Therapy (Signed)
[facility] [place], [place], [address] Phone: [phone]   Fax:  [phone]  Physical Therapy Evaluation  Patient Details  Name: Candace Diaz MRN: [id number] Date of Birth: 1956-01-01 Referring Provider (PT): Dr Havery Moros   Encounter Date: 12/02/2019  PT End of Session - 12/02/19 1009    Visit Number  1    Number of Visits  12    Date for PT Re-Evaluation  01/13/20    Authorization Type  MCD - requested visits initial 3    PT Start Time  1009   in late   PT Stop Time  1059    PT Time Calculation (min)  50 min    Activity Tolerance  Patient tolerated treatment well    Behavior During Therapy  [facility] for tasks assessed/performed       Past Medical History:  Diagnosis Date  . Chronic pain of right knee   . Diabetes mellitus    type 2   . Hypertension     Past Surgical History:  Procedure Laterality Date  . BREAST BIOPSY Left unsure  . CATARACT EXTRACTION Right   . surgery on ears   63 or 63 yo   " they did surgery to make me hear better but it just made it worse"   . TONSILLECTOMY  childhood  . TOTAL KNEE ARTHROPLASTY Right 10/28/2019   Procedure: TOTAL KNEE ARTHROPLASTY;  Surgeon: Marchia Bond, MD;  Location: [facility];  Service: Orthopedics;  Laterality: Right;    There were no vitals filed for this visit.   Subjective Assessment - 12/02/19 1010    Subjective  Pt had an elective Rt TkR on 10/28/2019, had HHPT after for 2 wks, currently she is walking with her walker , doing the exerices she was taught.    Pertinent History  HOH.    Patient Stated Goals  stay on walker but learn to walk better    Currently in Pain?  Yes    Pain Score  --   pt reports her knee hurts a little, not able to put a number on it   Pain Location  Knee    Pain Orientation  Right    Pain Descriptors / Indicators  Aching;Tightness    Pain Type  Surgical pain    Aggravating Factors   walking    Pain Relieving  Factors  icing         [facility] PT Assessment - 12/02/19 0001      Assessment   Medical Diagnosis  Rt TKR    Referring Provider (PT)  Dr Havery Moros    Onset Date/Surgical Date  10/28/19    Next MD Visit  not sure    Prior Therapy  HHPT      Precautions   Precautions  None    Precaution Comments  HOH      Restrictions   Weight Bearing Restrictions  No      Balance Screen   Has the patient fallen in the past 6 months  No    Has the patient had a decrease in activity level because of a fear of falling?   No      Home Environment   Living Environment  Private residence    [place]  One level      Prior Function   Level of Independence  Independent    Vocation  On disability  Leisure  crafts      Observation/Other Assessments   Other Surveys   Lower Extremity Functional Scale    Lower Extremity Functional Scale   37/80, 46.3%       Observation/Other Assessments-Edema    Edema  --   (+) heat, edema, decreased scar mobiliztation      Posture/Postural Control   Posture Comments  edema Rt knee      ROM / Strength   AROM / PROM / Strength  AROM;PROM;Strength      AROM   AROM Assessment Site  Knee    Right/Left Knee  Right   Lt ext to flex 0-140   Right Knee Extension  -10    Right Knee Flexion  98      PROM   PROM Assessment Site  Knee    Right/Left Knee  Right    Right Knee Extension  -8    Right Knee Flexion  105      Strength   Overall Strength Comments  poor Rt quad contraction    Strength Assessment Site  Hip;Knee;Ankle    Right/Left Hip  Right    Right Hip Flexion  4+/5    Right Hip Extension  4/5    Right Hip ABduction  3+/5    Right/Left Knee  Right   Lt WNL   Right Knee Flexion  4-/5    Right Knee Extension  4/5    Right/Left Ankle  --   WNL     Flexibility   Soft Tissue Assessment /Muscle Length  yes    Hamstrings  supine SLR Rt 60, Lt 70    Quadriceps  tight Rt       Palpation   Patella mobility  Rt  hypomobile     Palpation comment  very tight in Rt hamstrings, ITB and quads with some tenderness       Ambulation/Gait   Ambulation/Gait  Yes    Ambulation/Gait Assistance  6: Modified independent (Device/Increase time)    Ambulation Distance (Feet)  --   observed in gym    Assistive device  Rolling walker    Gait Pattern  Decreased stance time - right;Decreased step length - right;Trunk flexed   decreased heel strike and knee extension Rt    Ambulation Surface  Level    Gait velocity  slow                Objective measurements completed on examination: See above findings.      [facility] Adult PT Treatment/Exercise - 12/02/19 0001      Exercises   Exercises  Knee/Hip      Knee/Hip Exercises: Aerobic   Recumbent Bike  x4' able to make full revolutions.       Knee/Hip Exercises: Standing   Terminal Knee Extension  Strengthening;Right;10 reps   into ball on wall     Modalities   Modalities  Vasopneumatic      Vasopneumatic   Number Minutes Vasopneumatic   15 minutes    Vasopnuematic Location   Knee   rt   Vasopneumatic Pressure  High    Vasopneumatic Temperature   3*             PT Education - 12/02/19 1149    Education Details  POC    Person(s) Educated  Patient    Methods  Explanation    Comprehension  Returned demonstration       PT Short Term Goals - 12/02/19 1155  PT SHORT TERM GOAL #1   Title  I with initial HEP for Rt knee ROM and gait (12/23/2019)    Baseline  pt only performing some exercise at home    Time  3    Period  Weeks    Target Date  12/23/19      PT SHORT TERM GOAL #2   Title  improve Rt knee motion -5 extension to 110 flexion to assist with gait ( 12/23/2019)    Baseline  -10 extension , 98 flexion, gait abnormalities    Time  3    Period  Weeks    Status  New    Target Date  12/23/19      PT SHORT TERM GOAL #3   Title  ambulate with upright posture and good heel toe pattern Rt LE with least restrictive assistive  device ( 12/23/2019)    Baseline  flexed posture and decreased Rt heel strike and knee extension    Time  3    Period  Weeks    Status  New    Target Date  12/23/19        PT Long Term Goals - 12/02/19 1157      PT LONG TERM GOAL #1   Title  to be established at renewal for MCD    Period  Weeks    Status  New    Target Date  12/23/19             Plan - 12/02/19 1149    Clinical Impression Statement  63 yo female ~ 5 wks s/p Rt TKA.  She had HHPT for the first two weeks then has been working on walking and the exercise on her own.  She is very HOH and needs clear loud instruction.  She is walking with a walker and per her report has been using this for a while.  She lives with her daughter in a one level home.  She has decreased Rt knee ROM and strength along with some hip weakness.  Her knee is swollen, with heat and decreased scar and patellar mobility.  She ambulated with forward trunk flexion, decreased heel strike and knee extension on the Rt side.  She would benefit from PT to maximize her functional potential and decrease her risk of falling.    Personal Factors and Comorbidities  Behavior Pattern;Past/Current Experience;Comorbidity 3+    Comorbidities  DM, HTN, h/o breast CA    Examination-Activity Limitations  Sleep;Squat;Hygiene/Grooming;Locomotion Level    Examination-Participation Restrictions  Cleaning;Driving;Other;Laundry    Stability/Clinical Decision Making  Evolving/Moderate complexity    Clinical Decision Making  Moderate    Rehab Potential  Good    PT Frequency  2x / week    PT Duration  6 weeks    PT Treatment/Interventions  Iontophoresis 4mg /ml Dexamethasone;Stair training;Vasopneumatic Device;Patient/family education;Functional mobility training;Moist Heat;Therapeutic activities;Passive range of motion;Therapeutic exercise;Ultrasound;Cryotherapy;Electrical Stimulation;DME Instruction;Balance training;Neuromuscular re-education;Manual techniques;Taping    PT  Next Visit Plan  progress Rt knee ROM, LE strength, gait training, modalitiies PRN for edema    Consulted and Agree with Plan of Care  Patient       Patient will benefit from skilled therapeutic intervention in order to improve the following deficits and impairments:  Abnormal gait, Decreased range of motion, Difficulty walking, Pain, Decreased scar mobility, Hypomobility, Increased edema, Decreased strength  Visit Diagnosis: General weakness - Plan: PT plan of care cert/re-cert  Stiffness of right knee, not elsewhere classified - Plan: PT plan of care cert/re-cert  Generalized edema - Plan: PT plan of care cert/re-cert  Other abnormalities of gait and mobility - Plan: PT plan of care cert/re-cert     Problem List Patient Active Problem List   Diagnosis Date Noted  . Osteoarthritis of right knee 10/28/2019  . S/P TKR (total knee replacement) using cement, right 10/28/2019  . HTN (hypertension), malignant 12/03/2014  . DOE (dyspnea on exertion) 12/03/2014  . Acute serous otitis media 08/20/2014  . Essential hypertension, malignant 08/20/2014  . Other and unspecified hyperlipidemia 08/20/2014  . T2DM (type 2 diabetes mellitus) (HCC) 08/20/2014  . Immunization due 08/20/2014  . Need for Tdap vaccination 08/20/2014  . History of palpitations 08/20/2014  . History of CVA with residual deficit 08/20/2014  . Arthritis of right knee 08/20/2014    Roderic Scarce PT  12/02/2019, 12:15 PM  [facility] [address] [place], [place], [address] Phone: [phone]   Fax:  [phone]  Name: Wilhelmina Hark MRN: [id number] Date of Birth: 19-Jun-1956

## 2019-12-09 ENCOUNTER — Ambulatory Visit: Payer: Medicaid Other | Admitting: Physical Therapy

## 2019-12-11 ENCOUNTER — Other Ambulatory Visit: Payer: Self-pay

## 2019-12-11 ENCOUNTER — Ambulatory Visit: Payer: Medicaid Other | Admitting: Physical Therapy

## 2019-12-11 DIAGNOSIS — R601 Generalized edema: Secondary | ICD-10-CM | POA: Diagnosis not present

## 2019-12-11 DIAGNOSIS — R531 Weakness: Secondary | ICD-10-CM

## 2019-12-11 DIAGNOSIS — M25661 Stiffness of right knee, not elsewhere classified: Secondary | ICD-10-CM

## 2019-12-11 DIAGNOSIS — R2689 Other abnormalities of gait and mobility: Secondary | ICD-10-CM | POA: Diagnosis not present

## 2019-12-11 NOTE — Therapy (Signed)
[facility] [address] [place], [place], [address] Phone: [phone]   Fax:  [phone]  Physical Therapy Treatment  Patient Details  Name: Candace Diaz MRN: [id number] Date of Birth: October 14, 1956 Referring Provider (PT): Dr Howell Rucks   Encounter Date: 12/11/2019  PT End of Session - 12/11/19 1329    Visit Number  2    Number of Visits  12    Date for PT Re-Evaluation  01/13/20    PT Start Time  1130    PT Stop Time  1220    PT Time Calculation (min)  50 min       Past Medical History:  Diagnosis Date  . Chronic pain of right knee   . Diabetes mellitus    type 2   . Hypertension     Past Surgical History:  Procedure Laterality Date  . BREAST BIOPSY Left unsure  . CATARACT EXTRACTION Right   . surgery on ears   10 or 63 yo   " they did surgery to make me hear better but it just made it worse"   . TONSILLECTOMY  childhood  . TOTAL KNEE ARTHROPLASTY Right 10/28/2019   Procedure: TOTAL KNEE ARTHROPLASTY;  Surgeon: Teryl Lucy, MD;  Location: [facility];  Service: Orthopedics;  Laterality: Right;    There were no vitals filed for this visit.  Subjective Assessment - 12/11/19 1135    Subjective  pt amb in on RW with heavy leaning on RW and decreased knee ext- minimal to no c/o pain    Currently in Pain?  Yes    Pain Score  2     Pain Location  Knee    Pain Orientation  Right         [facility] PT Assessment - 12/11/19 0001      PROM   PROM Assessment Site  Knee    Right/Left Knee  Right    Right Knee Extension  6    Right Knee Flexion  110                   [facility] Adult PT Treatment/Exercise - 12/11/19 0001      Ambulation/Gait   Pre-Gait Activities  cued with gait with RW to stand up tall, stay inside walker and heel strike    Gait Comments  SPC- min A verb and tacile cuing to stay up tall, notto put SPC out so far and squencing, multi brekas needed d/t fatigue      Posture/Postural  Control   Posture Comments  edema Rt knee   pt verb she would ice at home     Knee/Hip Exercises: Aerobic   Nustep  L 4 6 min - cuing to push into ext      Knee/Hip Exercises: Machines for Strengthening   Cybex Leg Press  20# 2 sets 10   cued for TKE and quad activation     Knee/Hip Exercises: Seated   Long Arc Quad  Left;2 sets;10 reps   red tband   Long Arc Quad Limitations  verb and tactile cuing for TKE    Other Seated Knee/Hip Exercises  TKE red tabnd    Hamstring Curl  Strengthening;Left;2 sets;10 reps   red tband     Manual Therapy   Manual Therapy  Joint mobilization;Passive ROM   focus on ext   Passive ROM  focus on ext  PT Education - 12/11/19 1136    Education Details  pt able to verb and demo ex from HHPT. pt needed verb and tactile cuing to activate quad    Person(s) Educated  Patient    Methods  Explanation;Demonstration    Comprehension  Verbalized understanding;Returned demonstration;Tactile cues required;Verbal cues required       PT Short Term Goals - 12/02/19 1155      PT SHORT TERM GOAL #1   Title  I with initial HEP for Rt knee ROM and gait (12/23/2019)    Baseline  pt only performing some exercise at home    Time  3    Period  Weeks    Target Date  12/23/19      PT SHORT TERM GOAL #2   Title  improve Rt knee motion -5 extension to 110 flexion to assist with gait ( 12/23/2019)    Baseline  -10 extension , 98 flexion, gait abnormalities    Time  3    Period  Weeks    Status  New    Target Date  12/23/19      PT SHORT TERM GOAL #3   Title  ambulate with upright posture and good heel toe pattern Rt LE with least restrictive assistive device ( 12/23/2019)    Baseline  flexed posture and decreased Rt heel strike and knee extension    Time  3    Period  Weeks    Status  New    Target Date  12/23/19      PT SHORT TERM GOAL #4   Title  improve Rt hip strength =/> 4+/5 to assist with gait ( 12/23/2019)    Baseline  hip ext 4/5,  abduction 3+/5    Time  3    Period  Weeks    Status  New    Target Date  12/23/19        PT Long Term Goals - 12/02/19 1157      PT LONG TERM GOAL #1   Title  to be established at renewal for MCD    Period  Weeks    Status  New    Target Date  12/23/19            Plan - 12/11/19 1329    Clinical Impression Statement  amb in with RW with fwd lean and heavy use on UE, educ on less UE support, staying upright and inside walker . [facility] but needed min A d/t poor tech, unsteady and freq rest. Reviewed HEPs from HHPT and cuing needed for quad activation and to decrease compensation.    PT Treatment/Interventions  Iontophoresis 4mg /ml Dexamethasone;Stair training;Vasopneumatic Device;Patient/family education;Functional mobility training;Moist Heat;Therapeutic activities;Passive range of motion;Therapeutic exercise;Ultrasound;Cryotherapy;Electrical Stimulation;DME Instruction;Balance training;Neuromuscular re-education;Manual techniques;Taping    PT Next Visit Plan  TKE, progress gait,quad strength       Patient will benefit from skilled therapeutic intervention in order to improve the following deficits and impairments:  Abnormal gait, Decreased range of motion, Difficulty walking, Pain, Decreased scar mobility, Hypomobility, Increased edema, Decreased strength  Visit Diagnosis: General weakness  Stiffness of right knee, not elsewhere classified     Problem List Patient Active Problem List   Diagnosis Date Noted  . Osteoarthritis of right knee 10/28/2019  . S/P TKR (total knee replacement) using cement, right 10/28/2019  . HTN (hypertension), malignant 12/03/2014  . DOE (dyspnea on exertion) 12/03/2014  . Acute serous otitis media 08/20/2014  . Essential hypertension, malignant  08/20/2014  . Other and unspecified hyperlipidemia 08/20/2014  . T2DM (type 2 diabetes mellitus) ([place]) 08/20/2014  . Immunization due 08/20/2014  . Need for Tdap vaccination  08/20/2014  . History of palpitations 08/20/2014  . History of CVA with residual deficit 08/20/2014  . Arthritis of right knee 08/20/2014    Santasia Rew,ANGIE PTA 12/11/2019, 1:33 PM  [place], [place], [address] Phone: [phone]   Fax:  [phone]  Name: Allora Bains MRN: [id number] Date of Birth: [birth year]

## 2019-12-15 ENCOUNTER — Encounter: Payer: Self-pay | Admitting: Physical Therapy

## 2019-12-15 ENCOUNTER — Ambulatory Visit: Payer: Medicaid Other | Admitting: Physical Therapy

## 2019-12-15 ENCOUNTER — Other Ambulatory Visit: Payer: Self-pay

## 2019-12-15 DIAGNOSIS — R531 Weakness: Secondary | ICD-10-CM

## 2019-12-15 DIAGNOSIS — R2689 Other abnormalities of gait and mobility: Secondary | ICD-10-CM | POA: Diagnosis not present

## 2019-12-15 DIAGNOSIS — M25661 Stiffness of right knee, not elsewhere classified: Secondary | ICD-10-CM

## 2019-12-15 DIAGNOSIS — R601 Generalized edema: Secondary | ICD-10-CM

## 2019-12-15 NOTE — Therapy (Signed)
[name], [place], [address] Phone: [phone]   Fax:  [phone]  Physical Therapy Treatment  Patient Details  Name: Candace Diaz MRN: [id number] Date of Birth: 10/22/1956 Referring Provider (PT): Dr Havery Moros   Encounter Date: 12/15/2019  PT End of Session - 12/15/19 0839    Visit Number  3    Date for PT Re-Evaluation  01/13/20    Authorization Type  MCD - requested visits initial 3    PT Start Time  0811    PT Stop Time  0843    PT Time Calculation (min)  32 min    Activity Tolerance  Patient tolerated treatment well       Past Medical History:  Diagnosis Date  . Chronic pain of right knee   . Diabetes mellitus    type 2   . Hypertension     Past Surgical History:  Procedure Laterality Date  . BREAST BIOPSY Left unsure  . CATARACT EXTRACTION Right   . surgery on ears   41 or 63 yo   " they did surgery to make me hear better but it just made it worse"   . TONSILLECTOMY  childhood  . TOTAL KNEE ARTHROPLASTY Right 10/28/2019   Procedure: TOTAL KNEE ARTHROPLASTY;  Surgeon: Marchia Bond, MD;  Location: [facility];  Service: Orthopedics;  Laterality: Right;    There were no vitals filed for this visit.  Subjective Assessment - 12/15/19 0809    Subjective  "I feel fine" Amb in heavy lean on RW, decrease R hip and knee flex    Currently in Pain?  No/denies                       [facility] Adult PT Treatment/Exercise - 12/15/19 0001      Knee/Hip Exercises: Aerobic   Nustep  L 4 6 min - cuing to push into ext      Knee/Hip Exercises: Machines for Strengthening   Cybex Leg Press  20lb 2x10 cues for TKE, RLE only 20lb TKE x5      Knee/Hip Exercises: Seated   Long Arc Quad  Left;2 sets;10 reps    Long Arc Quad Weight  2 lbs.    Long [employer] Limitations  verb and tactile cuing for TKE    Other Seated Knee/Hip Exercises  TKE green tabnd    Hamstring Curl   Strengthening;Right;2 sets;10 reps    Hamstring Limitations  green tband     Sit to Sand  2 sets;with UE support;10 reps   elevated UBE, cues to stand erect     Manual Therapy   Manual Therapy  Joint mobilization;Passive ROM    Passive ROM  Flex and Ext               PT Short Term Goals - 12/02/19 1155      PT SHORT TERM GOAL #1   Title  I with initial HEP for Rt knee ROM and gait (12/23/2019)    Baseline  pt only performing some exercise at home    Time  3    Period  Weeks    Target Date  12/23/19      PT SHORT TERM GOAL #2   Title  improve Rt knee motion -5 extension to 110 flexion to assist with gait ( 12/23/2019)    Baseline  -10 extension , 98 flexion, gait abnormalities  Time  3    Period  Weeks    Status  New    Target Date  12/23/19      PT SHORT TERM GOAL #3   Title  ambulate with upright posture and good heel toe pattern Rt LE with least restrictive assistive device ( 12/23/2019)    Baseline  flexed posture and decreased Rt heel strike and knee extension    Time  3    Period  Weeks    Status  New    Target Date  12/23/19      PT SHORT TERM GOAL #4   Title  improve Rt hip strength =/> 4+/5 to assist with gait ( 12/23/2019)    Baseline  hip ext 4/5, abduction 3+/5    Time  3    Period  Weeks    Status  New    Target Date  12/23/19        PT Long Term Goals - 12/02/19 1157      PT LONG TERM GOAL #1   Title  to be established at renewal for MCD    Period  Weeks    Status  New    Target Date  12/23/19            Plan - 12/15/19 0840    Clinical Impression Statement  Pt is very hard of hearing making it difficulty for her to understand commands even with and elevated voice. Passive knee flexion is good but pt is lacking extension. She leans heavy on RW with decrease R knee extension.    Comorbidities  DM, HTN, h/o breast CA    Examination-Activity Limitations  Sleep;Squat;Hygiene/Grooming;Locomotion Level    Examination-Participation  Restrictions  Cleaning;Driving;Other;Laundry    Rehab Potential  Good    PT Frequency  2x / week    PT Duration  6 weeks    PT Treatment/Interventions  Iontophoresis 4mg /ml Dexamethasone;Stair training;Vasopneumatic Device;Patient/family education;Functional mobility training;Moist Heat;Therapeutic activities;Passive range of motion;Therapeutic exercise;Ultrasound;Cryotherapy;Electrical Stimulation;DME Instruction;Balance training;Neuromuscular re-education;Manual techniques;Taping    PT Next Visit Plan  TKE, progress gait,quad strength       Patient will benefit from skilled therapeutic intervention in order to improve the following deficits and impairments:  Abnormal gait, Decreased range of motion, Difficulty walking, Pain, Decreased scar mobility, Hypomobility, Increased edema, Decreased strength  Visit Diagnosis: Generalized edema  Stiffness of right knee, not elsewhere classified  General weakness     Problem List Patient Active Problem List   Diagnosis Date Noted  . Osteoarthritis of right knee 10/28/2019  . S/P TKR (total knee replacement) using cement, right 10/28/2019  . HTN (hypertension), malignant 12/03/2014  . DOE (dyspnea on exertion) 12/03/2014  . Acute serous otitis media 08/20/2014  . Essential hypertension, malignant 08/20/2014  . Other and unspecified hyperlipidemia 08/20/2014  . T2DM (type 2 diabetes mellitus) ([facility]) 08/20/2014  . Immunization due 08/20/2014  . Need for Tdap vaccination 08/20/2014  . History of palpitations 08/20/2014  . History of CVA with residual deficit 08/20/2014  . Arthritis of right knee 08/20/2014    08/22/2014, PTA 12/15/2019, 8:43 AM  [facility] [address] [place], [place], [place] Phone: [phone]   Fax:  [phone]  Name: Candace Diaz MRN: Lupita Shutter Date of Birth: 1956/08/19

## 2019-12-16 ENCOUNTER — Other Ambulatory Visit: Payer: Self-pay

## 2019-12-16 ENCOUNTER — Ambulatory Visit: Payer: Medicaid Other | Admitting: Physical Therapy

## 2019-12-16 DIAGNOSIS — R601 Generalized edema: Secondary | ICD-10-CM | POA: Diagnosis not present

## 2019-12-16 DIAGNOSIS — R2689 Other abnormalities of gait and mobility: Secondary | ICD-10-CM | POA: Diagnosis not present

## 2019-12-16 DIAGNOSIS — R531 Weakness: Secondary | ICD-10-CM | POA: Diagnosis not present

## 2019-12-16 DIAGNOSIS — M25661 Stiffness of right knee, not elsewhere classified: Secondary | ICD-10-CM

## 2019-12-16 NOTE — Therapy (Signed)
[place], [place], [address] Phone: [phone]   Fax:  [phone]  Physical Therapy Treatment  Patient Details  Name: Candace Diaz MRN: [id number] Date of Birth: [birth year] Referring Provider (PT): Dr Havery Moros   Encounter Date: 12/16/2019  PT End of Session - 12/16/19 1241    Visit Number  4    Number of Visits  12    Date for PT Re-Evaluation  01/13/20    PT Start Time  7782    PT Stop Time  1243    PT Time Calculation (min)  42 min       Past Medical History:  Diagnosis Date  . Chronic pain of right knee   . Diabetes mellitus    type 2   . Hypertension     Past Surgical History:  Procedure Laterality Date  . BREAST BIOPSY Left unsure  . CATARACT EXTRACTION Right   . surgery on ears   79 or 63 yo   " they did surgery to make me hear better but it just made it worse"   . TONSILLECTOMY  childhood  . TOTAL KNEE ARTHROPLASTY Right 10/28/2019   Procedure: TOTAL KNEE ARTHROPLASTY;  Surgeon: Marchia Bond, MD;  Location: [facility];  Service: Orthopedics;  Laterality: Right;    There were no vitals filed for this visit.  Subjective Assessment - 12/16/19 1205    Subjective  15 min late d/t transportation. amb with RW -heavy use of UE and knee flexed. MD Jan 11    Currently in Pain?  Yes    Pain Score  2     Pain Location  Knee    Pain Orientation  Right         [facility] PT Assessment - 12/16/19 0001      AROM   AROM Assessment Site  Knee    Right/Left Knee  Right    Right Knee Extension  10    Right Knee Flexion  105      PROM   PROM Assessment Site  Knee    Right/Left Knee  Right    Right Knee Extension  5    Right Knee Flexion  110                   [facility] Adult PT Treatment/Exercise - 12/16/19 0001      Ambulation/Gait   Gait Comments  worked on upright posture and heel strike with RW, better after cued.      Knee/Hip Exercises: Aerobic   Recumbent Bike   5 min full rev    Nustep  L 4 6 min- LE only      Knee/Hip Exercises: Machines for Strengthening   Cybex Leg Press  20ln 2x10 cues for TKE,      Knee/Hip Exercises: Standing   Terminal Knee Extension  Strengthening;Right;2 sets;10 reps   with ball     Knee/Hip Exercises: Seated   Long Arc Quad  10 reps;Right;3 sets    Long Arc Quad Weight  3 lbs.    Long [employer] Limitations  verb and tactile cuing for TKE and quad activation    Other Seated Knee/Hip Exercises  fitter 2 blue ext 3 sets 10      Manual Therapy   Manual Therapy  Joint mobilization    Joint Mobilization  with belt to increase ext             PT  Education - 12/16/19 1231    Education Details  LLLD stretch with dried beans or rice 2-3 # over knee in ext 5-10 min 3 times day    Person(s) Educated  Patient    Methods  Explanation;Demonstration    Comprehension  Verbalized understanding       PT Short Term Goals - 12/16/19 1235      PT SHORT TERM GOAL #1   Title  I with initial HEP for Rt knee ROM and gait (12/23/2019)    Status  Achieved      PT SHORT TERM GOAL #2   Title  improve Rt knee motion -5 extension to 110 flexion to assist with gait ( 12/23/2019)    Status  Partially Met      PT SHORT TERM GOAL #3   Title  ambulate with upright posture and good heel toe pattern Rt LE with least restrictive assistive device ( 12/23/2019)    Baseline  flexed posture and decreased Rt heel strike and knee extension    Status  On-going      PT SHORT TERM GOAL #4   Title  improve Rt hip strength =/> 4+/5 to assist with gait ( 12/23/2019)    Baseline  4/5    Status  Partially Met        PT Long Term Goals - 12/02/19 1157      PT LONG TERM GOAL #1   Title  to be established at renewal for MCD    Period  Weeks    Status  New    Target Date  12/23/19            Plan - 12/16/19 1241    Clinical Impression Statement  excellent flexion but decreased knee ext , combination of tightenss and weakness,  educ on LLLD stretching at home. pt can improve gait when cued but continues to amb with flexed posture, RT knee flexion and decreased heel strike. Pt progressing with goals.    PT Treatment/Interventions  Iontophoresis 65m/ml Dexamethasone;Stair training;Vasopneumatic Device;Patient/family education;Functional mobility training;Moist Heat;Therapeutic activities;Passive range of motion;Therapeutic exercise;Ultrasound;Cryotherapy;Electrical Stimulation;DME Instruction;Balance training;Neuromuscular re-education;Manual techniques;Taping    PT Next Visit Plan  Pt has used all initial apporved visits . On 12/30 will nee dot put in for further authorization       Patient will benefit from skilled therapeutic intervention in order to improve the following deficits and impairments:  Abnormal gait, Decreased range of motion, Difficulty walking, Pain, Decreased scar mobility, Hypomobility, Increased edema, Decreased strength  Visit Diagnosis: Stiffness of right knee, not elsewhere classified  General weakness     Problem List Patient Active Problem List   Diagnosis Date Noted  . Osteoarthritis of right knee 10/28/2019  . S/P TKR (total knee replacement) using cement, right 10/28/2019  . HTN (hypertension), malignant 12/03/2014  . DOE (dyspnea on exertion) 12/03/2014  . Acute serous otitis media 08/20/2014  . Essential hypertension, malignant 08/20/2014  . Other and unspecified hyperlipidemia 08/20/2014  . T2DM (type 2 diabetes mellitus) ([place] 08/20/2014  . Immunization due 08/20/2014  . Need for Tdap vaccination 08/20/2014  . History of palpitations 08/20/2014  . History of CVA with residual deficit 08/20/2014  . Arthritis of right knee 08/20/2014    Curtis Cain,ANGIE PTA 12/16/2019, 12:44 PM  [place] [address]Phone: [phone]  Fax:  [phone] Name: Candace LacasseMRN:  [id number] of Birth: 602/04/1956

## 2019-12-17 ENCOUNTER — Ambulatory Visit: Payer: Medicaid Other | Admitting: Physical Therapy

## 2020-01-03 ENCOUNTER — Other Ambulatory Visit: Payer: Self-pay | Admitting: Family Medicine

## 2020-01-19 ENCOUNTER — Other Ambulatory Visit: Payer: Self-pay

## 2020-01-19 ENCOUNTER — Encounter: Payer: Self-pay | Admitting: Physical Therapy

## 2020-01-19 ENCOUNTER — Ambulatory Visit: Payer: Medicaid Other | Attending: Orthopedic Surgery | Admitting: Physical Therapy

## 2020-01-19 DIAGNOSIS — R601 Generalized edema: Secondary | ICD-10-CM

## 2020-01-19 DIAGNOSIS — R531 Weakness: Secondary | ICD-10-CM

## 2020-01-19 DIAGNOSIS — M25661 Stiffness of right knee, not elsewhere classified: Secondary | ICD-10-CM | POA: Diagnosis not present

## 2020-01-19 DIAGNOSIS — R2689 Other abnormalities of gait and mobility: Secondary | ICD-10-CM

## 2020-01-19 NOTE — Therapy (Signed)
[place], [place], [address] Phone: [phone]   Fax:  [phone]  Physical Therapy Treatment  Patient Details  Name: Candace Diaz MRN: [id number] Date of Birth: 1956-01-03 Referring Provider (PT): Dr Havery Moros   Encounter Date: 01/19/2020  PT End of Session - 01/19/20 1645    Visit Number  5    Number of Visits  12    Date for PT Re-Evaluation  01/13/20    PT Start Time  1600    PT Stop Time  1645    PT Time Calculation (min)  45 min    Activity Tolerance  Patient tolerated treatment well    Behavior During Therapy  [facility] for tasks assessed/performed       Past Medical History:  Diagnosis Date  . Chronic pain of right knee   . Diabetes mellitus    type 2   . Hypertension     Past Surgical History:  Procedure Laterality Date  . BREAST BIOPSY Left unsure  . CATARACT EXTRACTION Right   . surgery on ears   64 or 64 yo   " they did surgery to make me hear better but it just made it worse"   . TONSILLECTOMY  childhood  . TOTAL KNEE ARTHROPLASTY Right 10/28/2019   Procedure: TOTAL KNEE ARTHROPLASTY;  Surgeon: Marchia Bond, MD;  [facility]: [facility];  Service: Orthopedics;  Laterality: Right;    There were no vitals filed for this visit.  Subjective Assessment - 01/19/20 1608    Subjective  "Im ok"    Currently in Pain?  No/denies         [facility] PT Assessment - 01/19/20 0001      AROM   Right Knee Extension  13    Right Knee Flexion  97      PROM   Right Knee Extension  5    Right Knee Flexion  107                   [facility] Adult PT Treatment/Exercise - 01/19/20 0001      Ambulation/Gait   Ambulation/Gait  Yes    Ambulation/Gait Assistance  4: Min guard    Ambulation Distance (Feet)  60 Feet    Assistive device  None    Gait Pattern  Decreased arm swing - right;Decreased arm swing - left;Decreased step length - right;Decreased stance time - right;Decreased  hip/knee flexion - right    Ambulation Surface  Level      Knee/Hip Exercises: Aerobic   Recumbent Bike  4 min full rev    Nustep  L 4 6 min      Knee/Hip Exercises: Machines for Strengthening   Cybex Leg Press  20ln 2x10 cues for TKE,      Knee/Hip Exercises: Seated   Long Arc Quad  10 reps;Right;3 sets;2 sets    [employer] Weight  2 lbs.    Long [employer] Limitations  verb and tactile cuing for TKE and quad activation    Sit to Sand  2 sets;10 reps;without UE support   from lowered UBE               PT Short Term Goals - 12/16/19 1235      PT SHORT TERM GOAL #1   Title  I with initial HEP for Rt knee ROM and gait (12/23/2019)    Status  Achieved  PT SHORT TERM GOAL #2   Title  improve Rt knee motion -5 extension to 110 flexion to assist with gait ( 12/23/2019)    Status  Partially Met      PT SHORT TERM GOAL #3   Title  ambulate with upright posture and good heel toe pattern Rt LE with least restrictive assistive device ( 12/23/2019)    Baseline  flexed posture and decreased Rt heel strike and knee extension    Status  On-going      PT SHORT TERM GOAL #4   Title  improve Rt hip strength =/> 4+/5 to assist with gait ( 12/23/2019)    Baseline  4/5    Status  Partially Met        PT Long Term Goals - 12/02/19 1157      PT LONG TERM GOAL #1   Title  to be established at renewal for MCD    Period  Weeks    Status  New    Target Date  12/23/19            Plan - 01/19/20 1646    Clinical Impression Statement  Pt has had a slight decrease in R knee AROM. She is very limited with R knee extension. She is unstable with I ambulation. Decrease R heel strike, knee, and hip flexion. Pt also holds her UE back sticking out her chest. R knee extension is very weak, when doing LAQ she reveals that she has a mild stroke that affected her R leg about 5 years ago that is why extension is so hard to do.    Personal Factors and Comorbidities  Behavior  Pattern;Past/Current Experience;Comorbidity 3+    Comorbidities  DM, HTN, h/o breast CA    Examination-Activity Limitations  Sleep;Squat;Hygiene/Grooming;Locomotion Level    Stability/Clinical Decision Making  Evolving/Moderate complexity    Rehab Potential  Good    PT Duration  6 weeks    PT Treatment/Interventions  Iontophoresis 46m/ml Dexamethasone;Stair training;Vasopneumatic Device;Patient/family education;Functional mobility training;Moist Heat;Therapeutic activities;Passive range of motion;Therapeutic exercise;Ultrasound;Cryotherapy;Electrical Stimulation;DME Instruction;Balance training;Neuromuscular re-education;Manual techniques;Taping    PT Next Visit Plan  R knee ROM, strenght, and gait.       Patient will benefit from skilled therapeutic intervention in order to improve the following deficits and impairments:  Abnormal gait, Decreased range of motion, Difficulty walking, Pain, Decreased scar mobility, Hypomobility, Increased edema, Decreased strength  Visit Diagnosis: Stiffness of right knee, not elsewhere classified  General weakness  Generalized edema  Other abnormalities of gait and mobility     Problem List Patient Active Problem List   Diagnosis Date Noted  . Osteoarthritis of right knee 10/28/2019  . S/P TKR (total knee replacement) using cement, right 10/28/2019  . HTN (hypertension), malignant 12/03/2014  . DOE (dyspnea on exertion) 12/03/2014  . Acute serous otitis media 08/20/2014  . Essential hypertension, malignant 08/20/2014  . Other and unspecified hyperlipidemia 08/20/2014  . T2DM (type 2 diabetes mellitus) ([facility] 08/20/2014  . Immunization due 08/20/2014  . Need for Tdap vaccination 08/20/2014  . History of palpitations 08/20/2014  . History of CVA with residual deficit 08/20/2014  . Arthritis of right knee 08/20/2014    RScot Jun PTA 01/19/2020, 4:54 PM  [place] [address]Phone: [phone]  Fax:  [phone] Name: Candace ButzinMRN: [id number] of Birth: 6Jan 06, 1957

## 2020-01-28 ENCOUNTER — Other Ambulatory Visit: Payer: Self-pay

## 2020-01-28 ENCOUNTER — Encounter: Payer: Self-pay | Admitting: Family Medicine

## 2020-01-28 ENCOUNTER — Ambulatory Visit (INDEPENDENT_AMBULATORY_CARE_PROVIDER_SITE_OTHER): Payer: Medicaid Other | Admitting: Family Medicine

## 2020-01-28 VITALS — BP 129/90 | HR 82 | Temp 98.2°F | Ht 68.0 in | Wt 224.0 lb

## 2020-01-28 DIAGNOSIS — E118 Type 2 diabetes mellitus with unspecified complications: Secondary | ICD-10-CM

## 2020-01-28 DIAGNOSIS — I10 Essential (primary) hypertension: Secondary | ICD-10-CM | POA: Diagnosis not present

## 2020-01-28 DIAGNOSIS — Z09 Encounter for follow-up examination after completed treatment for conditions other than malignant neoplasm: Secondary | ICD-10-CM

## 2020-01-28 DIAGNOSIS — Z96651 Presence of right artificial knee joint: Secondary | ICD-10-CM

## 2020-01-28 LAB — POCT URINALYSIS DIPSTICK
Bilirubin, UA: NEGATIVE
Blood, UA: NEGATIVE
Glucose, UA: NEGATIVE
Ketones, UA: NEGATIVE
Leukocytes, UA: NEGATIVE
Nitrite, UA: NEGATIVE
Protein, UA: POSITIVE — AB
Spec Grav, UA: 1.02 (ref 1.010–1.025)
Urobilinogen, UA: 0.2 E.U./dL
pH, UA: 7.5 (ref 5.0–8.0)

## 2020-01-28 LAB — GLUCOSE, POCT (MANUAL RESULT ENTRY): POC Glucose: 146 mg/dl — AB (ref 70–99)

## 2020-01-28 NOTE — Progress Notes (Signed)
Patient [facility] Follow Up  Subjective:  Patient ID: Candace Diaz, female    DOB: 07-Jan-1956  Age: 64 y.o. MRN: [id number]  CC:  Chief Complaint  Patient presents with  . Follow-up    DM    HPI [name] is a 64 year old female who presents for Follow Up today.   Past Medical History:  Diagnosis Date  . Chronic pain of right knee   . Diabetes mellitus    type 2   . Hypertension    Current Status: Since her last office visit, she has been hospitalized for Right Total Knee Replacement. She has completed all PT treatments. She has graduated from walker to cane. She states that her pain has improved greatly, but she does have occasional stiffness. She denies visual changes, chest pain, cough, shortness of breath, heart palpitations, and falls. She has occasional headaches and dizziness with position changes. Denies severe headaches, confusion, seizures, double vision, and blurred vision, nausea and vomiting. Her most recent normal range of preprandial blood glucose levels have been between 110-160. She has seen low range of 60 and high of 220 since his last office visit. She denies fatigue, frequent urination, blurred vision, excessive hunger, excessive thirst, weight gain, weight loss, and poor wound healing. She continues to check her feet regularly. Her anxiety is mild today. She denies suicidal ideations, homicidal ideations, or auditory hallucinations. She denies fevers, chills, recent infections, weight loss, and night sweats.  No reports of GI problems such as diarrhea, and constipation. She has no reports of blood in stools, dysuria and hematuria. She denies pain today. She is accompanied by her daughter today.   Past Surgical History:  Procedure Laterality Date  . BREAST BIOPSY Left unsure  . CATARACT EXTRACTION Right   . surgery on ears   66 or 64 yo   " they did surgery to make me hear better but it just made it  worse"   . TONSILLECTOMY  childhood  . TOTAL KNEE ARTHROPLASTY Right 10/28/2019   Procedure: TOTAL KNEE ARTHROPLASTY;  Surgeon: Marchia Bond, MD;  Location: [facility];  Service: Orthopedics;  Laterality: Right;    Family History  Problem Relation Age of Onset  . Diabetes Brother   . Hypertension Brother   . Diabetes Other   . Hypertension Other     Social History   Socioeconomic History  . Marital status: Single    Spouse name: Not on file  . Number of children: Not on file  . Years of education: Not on file  . Highest education level: Not on file  Occupational History  . Not on file  Tobacco Use  . Smoking status: Former Smoker    Quit date: 2000    Years since quitting: 21.1  . Smokeless tobacco: Never Used  Substance and Sexual Activity  . Alcohol use: No  . Drug use: No  . Sexual activity: Not Currently  Other Topics Concern  . Not on file  Social History Narrative  . Not on file   Social Determinants of Health   Financial Resource Strain:   . Difficulty of Paying Living Expenses: Not on file  Food Insecurity:   . Worried About Charity fundraiser in the Last Year: Not on file  . Ran Out of Food in the Last Year: Not on file  Transportation Needs:   . Lack of Transportation (Medical): Not on file  .  Lack of Transportation (Non-Medical): Not on file  Physical Activity:   . Days of Exercise per Week: Not on file  . Minutes of Exercise per Session: Not on file  Stress:   . Feeling of Stress : Not on file  Social Connections:   . Frequency of Communication with Friends and Family: Not on file  . Frequency of Social Gatherings with Friends and Family: Not on file  . Attends Religious Services: Not on file  . Active Member of Clubs or Organizations: Not on file  . Attends Banker Meetings: Not on file  . Marital Status: Not on file  Intimate Partner Violence:   . Fear of Current or Ex-Partner: Not on file  . Emotionally Abused: Not on file  .  Physically Abused: Not on file  . Sexually Abused: Not on file    Outpatient Medications Prior to Visit  Medication Sig Dispense Refill  . ACCU-CHEK AVIVA PLUS test strip USE TO CHECK BLOOD SUGAR TWICE DAILY 50 each 3  . acetaminophen (TYLENOL) 500 MG tablet Take 1,000 mg by mouth every 6 (six) hours as needed for moderate pain or headache.    . albuterol (PROAIR HFA) 108 (90 Base) MCG/ACT inhaler INHALE 2 PUFFS BY MOUTH FOUR TIMES A DAY AS NEEDED FOR SHORTNESS OF BREATH. (Patient taking differently: Inhale 2 puffs into the lungs every 4 (four) hours as needed for wheezing or shortness of breath. ) 8.5 each 3  . amLODipine (NORVASC) 10 MG tablet Take 1 tablet (10 mg total) by mouth daily. 90 tablet 3  . baclofen (LIORESAL) 10 MG tablet Take 1 tablet (10 mg total) by mouth 3 (three) times daily. As needed for muscle spasm 50 tablet 0  . cholecalciferol (VITAMIN D3) 25 MCG (1000 UT) tablet Take 2,000 Units by mouth daily.    Marland Kitchen EASY TOUCH INSULIN SYRINGE 31G X 5/16" 0.5 ML MISC USE TO INJECT INSULIN EVERY DAY 100 each 1  . enoxaparin (LOVENOX) 30 MG/0.3ML injection Inject 0.3 mLs (30 mg total) into the skin every 12 (twelve) hours. 18 mL 0  . hydrochlorothiazide (HYDRODIURIL) 25 MG tablet Take 1 tablet (25 mg total) by mouth daily. 90 tablet 1  . Insulin Glargine (LANTUS SOLOSTAR) 100 UNIT/ML Solostar Pen Inject 10 Units into the skin daily at 10 pm. 5 pen 8  . Insulin Pen Needle (NOVOFINE) 30G X 8 MM MISC Administer insulin subcutaneously once daily. ICD 10 E.11 100 each 5  . JANUVIA 25 MG tablet Take 1 tablet by mouth once daily 90 tablet 0  . Lancets (ACCU-CHEK MULTICLIX) lancets USE TO CHECK BLOOD SUGAR TWICE DAILY 102 each 2  . Lancets (ACCU-CHEK MULTICLIX) lancets USE TO CHECK BLOOD SUGAR TWICE DAILY 102 each 0  . lisinopril (ZESTRIL) 20 MG tablet Take 1 tablet (20 mg total) by mouth daily. 90 tablet 2  . metFORMIN (GLUCOPHAGE) 1000 MG tablet TAKE 1 TABLET BY MOUTH TWICE DAILY WITH A MEAL  (Patient taking differently: Take 1,000 mg by mouth 2 (two) times daily. ) 180 tablet 2  . Multiple Vitamins-Minerals (MULTIVITAMIN WITH MINERALS) tablet Take 2 tablets by mouth daily.     . ondansetron (ZOFRAN) 4 MG tablet Take 1 tablet (4 mg total) by mouth every 8 (eight) hours as needed for nausea or vomiting. 10 tablet 0  . oxyCODONE (ROXICODONE) 5 MG immediate release tablet Take 1 tablet (5 mg total) by mouth every 4 (four) hours as needed for severe pain. 30 tablet 0  . polyethylene  glycol (MIRALAX / GLYCOLAX) 17 g packet Take 17 g by mouth daily as needed for mild constipation.    . Polyvinyl Alcohol-Povidone (REFRESH OP) Place 1 drop into both eyes daily as needed (dry eyes).    . sennosides-docusate sodium (SENOKOT-S) 8.6-50 MG tablet Take 2 tablets by mouth daily. 30 tablet 1  . simvastatin (ZOCOR) 10 MG tablet TAKE 1 TABLET BY MOUTH ONCE DAILY IN THE EVENING 30 tablet 3   No facility-administered medications prior to visit.    Allergies  Allergen Reactions  . Aspirin Other (See Comments)    Internal bleeding  . Penicillins Hives    Did it involve swelling of the face/tongue/throat, SOB, or low BP? Unknown Did it involve sudden or severe rash/hives, skin peeling, or any reaction on the inside of your mouth or nose? Unknown Did you need to seek medical attention at a hospital or doctor's office? Unknown When did it last happen?years If all above answers are "NO", may proceed with cephalosporin use.    ROS Review of Systems  Constitutional: Negative.   HENT: Negative.   Eyes: Negative.   Respiratory: Negative.   Cardiovascular: Negative.   Gastrointestinal: Negative.   Endocrine: Negative.   Genitourinary: Negative.   Musculoskeletal: Negative.   Skin: Negative.   Allergic/Immunologic: Negative.   Neurological: Positive for dizziness (occasional ) and headaches (occasional ).  Hematological: Negative.   Psychiatric/Behavioral: Negative.       Objective:      Physical Exam  Constitutional: She is oriented to person, place, and time. She appears well-developed and well-nourished.  HENT:  Head: Normocephalic and atraumatic.  Eyes: Conjunctivae are normal.  Cardiovascular: Normal rate, regular rhythm, normal heart sounds and intact distal pulses.  Pulmonary/Chest: Effort normal and breath sounds normal.  Abdominal: Soft. Bowel sounds are normal.  Musculoskeletal:     Cervical back: Normal range of motion and neck supple.     Comments: Limited ROM in right knee.   Neurological: She is alert and oriented to person, place, and time. She has normal reflexes.  Skin: Skin is warm and dry.  Psychiatric: She has a normal mood and affect. Her behavior is normal. Judgment and thought content normal.  Nursing note and vitals reviewed.   BP 129/90   Pulse 82   Temp 98.2 F (36.8 C) (Oral)   Ht 5\' 8"  (1.727 m)   Wt 224 lb (101.6 kg)   SpO2 99%   BMI 34.06 kg/m  Wt Readings from Last 3 Encounters:  01/28/20 224 lb (101.6 kg)  10/28/19 221 lb 3.2 oz (100.3 kg)  10/22/19 221 lb 3.2 oz (100.3 kg)     Health Maintenance Due  Topic Date Due  . OPHTHALMOLOGY EXAM  01/28/2016  . FOOT EXAM  03/02/2019  . INFLUENZA VACCINE  07/26/2019    There are no preventive care reminders to display for this patient.  Lab Results  Component Value Date   TSH 1.840 03/01/2015   Lab Results  Component Value Date   WBC 10.9 (H) 10/29/2019   HGB 9.2 (L) 10/29/2019   HCT 29.7 (L) 10/29/2019   MCV 93.1 10/29/2019   PLT 250 10/29/2019   Lab Results  Component Value Date   NA 140 10/29/2019   K 3.6 10/29/2019   CO2 25 10/29/2019   GLUCOSE 106 (H) 10/29/2019   BUN 18 10/29/2019   CREATININE 0.79 10/29/2019   BILITOT 0.5 04/11/2019   ALKPHOS 51 04/11/2019   AST 33 04/11/2019   ALT 36 (  H) 04/11/2019   PROT 7.8 04/11/2019   ALBUMIN 4.5 04/11/2019   CALCIUM 8.2 (L) 10/29/2019   ANIONGAP 11 10/29/2019   Lab Results  Component Value Date   CHOL 141  04/11/2019   Lab Results  Component Value Date   HDL 54 04/11/2019   Lab Results  Component Value Date   LDLCALC 74 04/11/2019   Lab Results  Component Value Date   TRIG 66 04/11/2019   Lab Results  Component Value Date   CHOLHDL 2.6 04/11/2019   Lab Results  Component Value Date   HGBA1C 6.4 (H) 10/22/2019      Assessment & Plan:   1. Hospital discharge follow-up  2. S/P total knee replacement using cement, right  3. Type 2 diabetes mellitus with complication, without long-term current use of insulin (HCC) Stable. She will continue medication as prescribed, to decrease foods/beverages high in sugars and carbs and follow Heart Healthy or DASH diet. Increase physical activity to at least 30 minutes cardio exercise daily.  - POCT glucose (manual entry) - POCT urinalysis dipstick  4. HTN (hypertension), malignant Blood pressure is stable today. She will continue to take medications as prescribed, to decrease high sodium intake, excessive alcohol intake, increase potassium intake, smoking cessation, and increase physical activity of at least 30 minutes of cardio activity daily. She will continue to follow Heart Healthy or DASH diet.  5. Follow up She will follow up in 6 months.   No orders of the defined types were placed in this encounter.   Orders Placed This Encounter  Procedures  . POCT glucose (manual entry)  . POCT urinalysis dipstick    Referral Orders  No referral(s) requested today    Raliegh Ip,  MSN, FNP-BC [facility] [address] [place], [place] [address] [phone]- fax   Problem List Items Addressed This Visit      Cardiovascular and Mediastinum   HTN (hypertension), malignant (Chronic)    Other Visit Diagnoses    Hospital discharge follow-up    -  Primary   S/P total knee replacement using cement, right       Type 2 diabetes mellitus with  complication, without long-term current use of insulin (HCC)       Relevant Orders   POCT glucose (manual entry) (Completed)   POCT urinalysis dipstick (Completed)   Follow up          No orders of the defined types were placed in this encounter.   Follow-up: Return in about 6 months (around 07/27/2020) for Labs/OV.    Kallie Locks, FNP

## 2020-02-04 ENCOUNTER — Other Ambulatory Visit: Payer: Self-pay | Admitting: Family Medicine

## 2020-02-04 DIAGNOSIS — E118 Type 2 diabetes mellitus with unspecified complications: Secondary | ICD-10-CM

## 2020-02-06 ENCOUNTER — Other Ambulatory Visit: Payer: Self-pay

## 2020-02-06 ENCOUNTER — Telehealth: Payer: Self-pay | Admitting: Family Medicine

## 2020-02-06 MED ORDER — ACCU-CHEK MULTICLIX LANCETS MISC: 0 refills | Status: AC

## 2020-02-09 ENCOUNTER — Other Ambulatory Visit: Payer: Self-pay | Admitting: Family Medicine

## 2020-02-09 DIAGNOSIS — E118 Type 2 diabetes mellitus with unspecified complications: Secondary | ICD-10-CM

## 2020-02-09 MED ORDER — ACCU-CHEK GUIDE VI STRP: ORAL_STRIP | 12 refills | Status: AC

## 2020-02-09 NOTE — Telephone Encounter (Signed)
done

## 2020-03-02 ENCOUNTER — Other Ambulatory Visit: Payer: Self-pay | Admitting: Family Medicine

## 2020-03-02 DIAGNOSIS — I10 Essential (primary) hypertension: Secondary | ICD-10-CM

## 2020-03-24 DIAGNOSIS — Z96652 Presence of left artificial knee joint: Secondary | ICD-10-CM | POA: Diagnosis not present

## 2020-04-06 ENCOUNTER — Other Ambulatory Visit: Payer: Self-pay | Admitting: Family Medicine

## 2020-04-28 ENCOUNTER — Other Ambulatory Visit: Payer: Self-pay | Admitting: Family Medicine

## 2020-04-28 DIAGNOSIS — E785 Hyperlipidemia, unspecified: Secondary | ICD-10-CM

## 2020-05-05 ENCOUNTER — Other Ambulatory Visit: Payer: Self-pay | Admitting: Family Medicine

## 2020-05-14 ENCOUNTER — Other Ambulatory Visit: Payer: Self-pay | Admitting: Family Medicine

## 2020-06-25 ENCOUNTER — Ambulatory Visit (INDEPENDENT_AMBULATORY_CARE_PROVIDER_SITE_OTHER): Payer: Medicaid Other | Admitting: Family Medicine

## 2020-06-25 ENCOUNTER — Encounter: Payer: Self-pay | Admitting: Family Medicine

## 2020-06-25 ENCOUNTER — Other Ambulatory Visit: Payer: Self-pay

## 2020-06-25 VITALS — BP 147/85 | HR 80 | Temp 97.0°F | Ht 68.0 in | Wt 221.0 lb

## 2020-06-25 DIAGNOSIS — I10 Essential (primary) hypertension: Secondary | ICD-10-CM

## 2020-06-25 DIAGNOSIS — E118 Type 2 diabetes mellitus with unspecified complications: Secondary | ICD-10-CM

## 2020-06-25 DIAGNOSIS — Z09 Encounter for follow-up examination after completed treatment for conditions other than malignant neoplasm: Secondary | ICD-10-CM | POA: Diagnosis not present

## 2020-06-25 DIAGNOSIS — Z01419 Encounter for gynecological examination (general) (routine) without abnormal findings: Secondary | ICD-10-CM

## 2020-06-25 DIAGNOSIS — Z124 Encounter for screening for malignant neoplasm of cervix: Secondary | ICD-10-CM | POA: Diagnosis not present

## 2020-06-25 NOTE — Progress Notes (Signed)
[facility]    Pap Smear  Subjective:  Patient ID: Candace Diaz, female    DOB: 18-Oct-1956  Age: 64 y.o. MRN: [id number]  CC:  Chief Complaint  Patient presents with  . pap smear    HPI Candace Diaz is a 64 year old female who presents for Pap Smear today.    Patient Active Problem List   Diagnosis Date Noted  . Osteoarthritis of right knee 10/28/2019  . S/P TKR (total knee replacement) using cement, right 10/28/2019  . HTN (hypertension), malignant 12/03/2014  . DOE (dyspnea on exertion) 12/03/2014  . Acute serous otitis media 08/20/2014  . Essential hypertension, malignant 08/20/2014  . Other and unspecified hyperlipidemia 08/20/2014  . T2DM (type 2 diabetes mellitus) (HCC) 08/20/2014  . Immunization due 08/20/2014  . Need for Tdap vaccination 08/20/2014  . History of palpitations 08/20/2014  . History of CVA with residual deficit 08/20/2014  . Arthritis of right knee 08/20/2014    Past Medical History:  Diagnosis Date  . Chronic pain of right knee   . Diabetes mellitus    type 2   . Hypertension     Gynecological Exam  She denies any known history of abnormal PAP. No family history of gynecological cancers or breast cancer. Denies dysuria, or vaginal bleeding. She complains of vaginal irritations. Denies excoriation or odor. She is overdue for her mammogram. Denies prior abnormal breast cancer screenings.    Past Surgical History:  Procedure Laterality Date  . BREAST BIOPSY Left unsure  . CATARACT EXTRACTION Right   . surgery on ears   10 or 63 yo   " they did surgery to make me hear better but it just made it worse"   . TONSILLECTOMY  childhood  . TOTAL KNEE ARTHROPLASTY Right 10/28/2019   Procedure: TOTAL KNEE ARTHROPLASTY;  Surgeon: Teryl Lucy, MD;  Location: [facility];  Service: Orthopedics;  Laterality: Right;    Family History  Problem Relation Age of Onset  . Diabetes Brother   . Hypertension  Brother   . Diabetes Other   . Hypertension Other     Social History   Socioeconomic History  . Marital status: Single    Spouse name: Not on file  . Number of children: Not on file  . Years of education: Not on file  . Highest education level: Not on file  Occupational History  . Not on file  Tobacco Use  . Smoking status: Former Smoker    Quit date: 2000    Years since quitting: 21.5  . Smokeless tobacco: Never Used  Vaping Use  . Vaping Use: Never used  Substance and Sexual Activity  . Alcohol use: No  . Drug use: No  . Sexual activity: Not Currently  Other Topics Concern  . Not on file  Social History Narrative  . Not on file   Social Determinants of Health   Financial Resource Strain:   . Difficulty of Paying Living Expenses:   Food Insecurity:   . Worried About Programme researcher, broadcasting/film/video in the Last Year:   . Barista in the Last Year:   Transportation Needs:   . Freight forwarder (Medical):   Marland Kitchen Lack of Transportation (Non-Medical):   Physical Activity:   . Days of Exercise per Week:   . Minutes of Exercise per Session:   Stress:   . Feeling of Stress :   Social Connections:   .  Frequency of Communication with Friends and Family:   . Frequency of Social Gatherings with Friends and Family:   . Attends Religious Services:   . Active Member of Clubs or Organizations:   . Attends Banker Meetings:   Marland Kitchen Marital Status:   Intimate Partner Violence:   . Fear of Current or Ex-Partner:   . Emotionally Abused:   Marland Kitchen Physically Abused:   . Sexually Abused:     Outpatient Medications Prior to Visit  Medication Sig Dispense Refill  . acetaminophen (TYLENOL) 500 MG tablet Take 1,000 mg by mouth every 6 (six) hours as needed for moderate pain or headache.    Marland Kitchen amLODipine (NORVASC) 10 MG tablet Take 1 tablet by mouth once daily 90 tablet 0  . baclofen (LIORESAL) 10 MG tablet Take 1 tablet (10 mg total) by mouth 3 (three) times daily. As needed for  muscle spasm 50 tablet 0  . cholecalciferol (VITAMIN D3) 25 MCG (1000 UT) tablet Take 2,000 Units by mouth daily.    Marland Kitchen EASY TOUCH INSULIN SYRINGE 31G X 5/16" 0.5 ML MISC USE TO INJECT INSULIN EVERY DAY 100 each 1  . glucose blood (ACCU-CHEK GUIDE) test strip Use as instructed 102 each 12  . hydrochlorothiazide (HYDRODIURIL) 25 MG tablet Take 1 tablet (25 mg total) by mouth daily. 90 tablet 1  . Insulin Pen Needle (NOVOFINE) 30G X 8 MM MISC Administer insulin subcutaneously once daily. ICD 10 E.11 100 each 5  . JANUVIA 25 MG tablet Take 1 tablet by mouth once daily 90 tablet 0  . Lancets (ACCU-CHEK MULTICLIX) lancets USE TO CHECK BLOOD SUGAR TWICE DAILY 102 each 2  . Lancets (ACCU-CHEK MULTICLIX) lancets USE TO CHECK BLOOD SUGAR TWICE DAILY 102 each 0  . LANTUS SOLOSTAR 100 UNIT/ML Solostar Pen INJECT 10 UNITS INTO THE SKIN DAILY AT 10 PM. 15 mL 44  . lisinopril (ZESTRIL) 20 MG tablet Take 1 tablet by mouth once daily 90 tablet 0  . metFORMIN (GLUCOPHAGE) 1000 MG tablet TAKE 1 TABLET BY MOUTH TWICE DAILY WITH A MEAL 180 tablet 0  . Multiple Vitamins-Minerals (MULTIVITAMIN WITH MINERALS) tablet Take 2 tablets by mouth daily.     . ondansetron (ZOFRAN) 4 MG tablet Take 1 tablet (4 mg total) by mouth every 8 (eight) hours as needed for nausea or vomiting. 10 tablet 0  . oxyCODONE (ROXICODONE) 5 MG immediate release tablet Take 1 tablet (5 mg total) by mouth every 4 (four) hours as needed for severe pain. 30 tablet 0  . polyethylene glycol (MIRALAX / GLYCOLAX) 17 g packet Take 17 g by mouth daily as needed for mild constipation.    . Polyvinyl Alcohol-Povidone (REFRESH OP) Place 1 drop into both eyes daily as needed (dry eyes).    . sennosides-docusate sodium (SENOKOT-S) 8.6-50 MG tablet Take 2 tablets by mouth daily. 30 tablet 1  . simvastatin (ZOCOR) 10 MG tablet TAKE 1 TABLET BY MOUTH ONCE DAILY IN THE EVENING 30 tablet 2  . albuterol (PROAIR HFA) 108 (90 Base) MCG/ACT inhaler INHALE 2 PUFFS BY  MOUTH FOUR TIMES A DAY AS NEEDED FOR SHORTNESS OF BREATH. (Patient not taking: Reported on 06/25/2020) 8.5 each 3  . enoxaparin (LOVENOX) 30 MG/0.3ML injection Inject 0.3 mLs (30 mg total) into the skin every 12 (twelve) hours. 18 mL 0   No facility-administered medications prior to visit.    Allergies  Allergen Reactions  . Aspirin Other (See Comments)    Internal bleeding  . Penicillins Hives  Did it involve swelling of the face/tongue/throat, SOB, or low BP? Unknown Did it involve sudden or severe rash/hives, skin peeling, or any reaction on the inside of your mouth or nose? Unknown Did you need to seek medical attention at a hospital or doctor's office? Unknown When did it last happen?years If all above answers are "NO", may proceed with cephalosporin use.    ROS Review of Systems  Constitutional: Negative.   HENT: Negative.   Respiratory: Negative.   Cardiovascular: Negative.   Gastrointestinal: Negative.   Endocrine: Negative.   Musculoskeletal: Negative.   Neurological: Negative.       Objective:    Physical Exam Vitals and nursing note reviewed.  Constitutional:      Appearance: Normal appearance.  HENT:     Head: Normocephalic and atraumatic.     Nose: Nose normal.     Mouth/Throat:     Mouth: Mucous membranes are moist.     Pharynx: Oropharynx is clear.  Cardiovascular:     Rate and Rhythm: Normal rate and regular rhythm.     Pulses: Normal pulses.     Heart sounds: Normal heart sounds.  Pulmonary:     Effort: Pulmonary effort is normal.     Breath sounds: Normal breath sounds.  Abdominal:     General: Bowel sounds are normal.     Palpations: Abdomen is soft.  Musculoskeletal:        General: Normal range of motion.     Cervical back: Normal range of motion and neck supple.  Skin:    General: Skin is warm and dry.  Neurological:     General: No focal deficit present.     Mental Status: She is alert and oriented to person, place, and time.    Psychiatric:        Mood and Affect: Mood normal.        Behavior: Behavior normal.        Thought Content: Thought content normal.        Judgment: Judgment normal.     BP (!) 147/85 (BP Location: Right Arm, Patient Position: Sitting)   Pulse 80   Temp (!) 97 F (36.1 C) (Temporal)   Ht 5\' 8"  (1.727 m)   Wt 221 lb (100.2 kg)   SpO2 100%   BMI 33.60 kg/m  Wt Readings from Last 3 Encounters:  06/25/20 221 lb (100.2 kg)  01/28/20 224 lb (101.6 kg)  10/28/19 221 lb 3.2 oz (100.3 kg)     Health Maintenance Due  Topic Date Due  . COVID-19 Vaccine (1) Never done  . OPHTHALMOLOGY EXAM  01/28/2016  . FOOT EXAM  03/02/2019  . HEMOGLOBIN A1C  04/21/2020    There are no preventive care reminders to display for this patient.  Lab Results  Component Value Date   TSH 1.840 03/01/2015   Lab Results  Component Value Date   WBC 10.9 (H) 10/29/2019   HGB 9.2 (L) 10/29/2019   HCT 29.7 (L) 10/29/2019   MCV 93.1 10/29/2019   PLT 250 10/29/2019   Lab Results  Component Value Date   NA 140 10/29/2019   K 3.6 10/29/2019   CO2 25 10/29/2019   GLUCOSE 106 (H) 10/29/2019   BUN 18 10/29/2019   CREATININE 0.79 10/29/2019   BILITOT 0.5 04/11/2019   ALKPHOS 51 04/11/2019   AST 33 04/11/2019   ALT 36 (H) 04/11/2019   PROT 7.8 04/11/2019   ALBUMIN 4.5 04/11/2019   CALCIUM 8.2 (L) 10/29/2019  ANIONGAP 11 10/29/2019   Lab Results  Component Value Date   CHOL 141 04/11/2019   Lab Results  Component Value Date   HDL 54 04/11/2019   Lab Results  Component Value Date   LDLCALC 74 04/11/2019   Lab Results  Component Value Date   TRIG 66 04/11/2019   Lab Results  Component Value Date   CHOLHDL 2.6 04/11/2019   Lab Results  Component Value Date   HGBA1C 6.4 (H) 10/22/2019      Assessment & Plan:   1. Encounter for cervical Pap smear with pelvic exam Patient tolerated exam well with no complaints. Pap Smear results are pending.  - Pap IG w/ reflex to HPV when  ASC-U ([facility])   2. Screening for cervical cancer - IGP, rfx Aptima HPV ASCU  3. Type 2 diabetes mellitus with complication, without long-term current use of insulin (HCC)  She denies fatigue, frequent urination, blurred vision, excessive hunger, excessive thirst, weight gain, weight loss, and poor wound healing. She continues to check her feet regularly.   4. HTN (hypertension), malignant She denies visual changes, chest pain, cough, shortness of breath, heart palpitations, and falls. She has occasional headaches and dizziness with position changes. Denies severe headaches, confusion, seizures, double vision, and blurred vision, nausea and vomiting. She will continue to take medications as prescribed, to decrease high sodium intake, excessive alcohol intake, increase potassium intake, smoking cessation, and increase physical activity of at least 30 minutes of cardio activity daily. She will continue to follow Heart Healthy or DASH diet.  5. Follow up  No orders of the defined types were placed in this encounter.  No orders of the defined types were placed in this encounter.   Referral Orders  No referral(s) requested today   Raliegh IpNatalie Marilyne Haseley,  MSN, FNP-BC [facility] [address]  [place], [place] [address] [phone] fax   Problem List Items Addressed This Visit      Cardiovascular and Mediastinum   HTN (hypertension), malignant (Chronic)    Other Visit Diagnoses    Encounter for cervical Pap smear with pelvic exam    -  Primary   Screening for cervical cancer       Relevant Orders   IGP, rfx Aptima HPV ASCU (Completed)   Type 2 diabetes mellitus with complication, without long-term current use of insulin (HCC)       Follow up          No orders of the defined types were placed in this encounter.   Follow-up: No follow-ups on file.    Kallie LocksNatalie M Mikhaila Roh,  FNP

## 2020-06-30 LAB — IGP, RFX APTIMA HPV ASCU

## 2020-07-12 ENCOUNTER — Other Ambulatory Visit: Payer: Self-pay | Admitting: Family Medicine

## 2020-07-12 DIAGNOSIS — I10 Essential (primary) hypertension: Secondary | ICD-10-CM

## 2020-07-20 ENCOUNTER — Other Ambulatory Visit: Payer: Self-pay | Admitting: Family Medicine

## 2020-07-20 DIAGNOSIS — Z1231 Encounter for screening mammogram for malignant neoplasm of breast: Secondary | ICD-10-CM

## 2020-07-21 ENCOUNTER — Other Ambulatory Visit: Payer: Self-pay | Admitting: Family Medicine

## 2020-07-25 DIAGNOSIS — R2681 Unsteadiness on feet: Secondary | ICD-10-CM

## 2020-07-25 HISTORY — DX: Unsteadiness on feet: R26.81

## 2020-07-27 ENCOUNTER — Ambulatory Visit (INDEPENDENT_AMBULATORY_CARE_PROVIDER_SITE_OTHER): Payer: Medicaid Other | Admitting: Family Medicine

## 2020-07-27 ENCOUNTER — Encounter: Payer: Self-pay | Admitting: Family Medicine

## 2020-07-27 ENCOUNTER — Other Ambulatory Visit: Payer: Self-pay

## 2020-07-27 VITALS — BP 128/72 | HR 78 | Temp 97.9°F | Resp 17 | Ht 69.0 in | Wt 222.8 lb

## 2020-07-27 DIAGNOSIS — Z09 Encounter for follow-up examination after completed treatment for conditions other than malignant neoplasm: Secondary | ICD-10-CM | POA: Diagnosis not present

## 2020-07-27 DIAGNOSIS — R269 Unspecified abnormalities of gait and mobility: Secondary | ICD-10-CM | POA: Diagnosis not present

## 2020-07-27 DIAGNOSIS — H539 Unspecified visual disturbance: Secondary | ICD-10-CM

## 2020-07-27 DIAGNOSIS — R8290 Unspecified abnormal findings in urine: Secondary | ICD-10-CM

## 2020-07-27 DIAGNOSIS — Z96651 Presence of right artificial knee joint: Secondary | ICD-10-CM

## 2020-07-27 DIAGNOSIS — R739 Hyperglycemia, unspecified: Secondary | ICD-10-CM | POA: Diagnosis not present

## 2020-07-27 DIAGNOSIS — R7309 Other abnormal glucose: Secondary | ICD-10-CM

## 2020-07-27 DIAGNOSIS — R2681 Unsteadiness on feet: Secondary | ICD-10-CM

## 2020-07-27 DIAGNOSIS — E118 Type 2 diabetes mellitus with unspecified complications: Secondary | ICD-10-CM

## 2020-07-27 DIAGNOSIS — I10 Essential (primary) hypertension: Secondary | ICD-10-CM | POA: Diagnosis not present

## 2020-07-27 DIAGNOSIS — I6930 Unspecified sequelae of cerebral infarction: Secondary | ICD-10-CM

## 2020-07-27 LAB — POCT GLYCOSYLATED HEMOGLOBIN (HGB A1C)
HbA1c POC (<> result, manual entry): 6.3 % (ref 4.0–5.6)
HbA1c, POC (controlled diabetic range): 6.3 % (ref 0.0–7.0)
HbA1c, POC (prediabetic range): 6.3 % (ref 5.7–6.4)
Hemoglobin A1C: 6.3 % — AB (ref 4.0–5.6)

## 2020-07-27 LAB — POCT URINALYSIS DIPSTICK
Bilirubin, UA: NEGATIVE
Blood, UA: NEGATIVE
Glucose, UA: NEGATIVE
Ketones, UA: NEGATIVE
Nitrite, UA: NEGATIVE
Protein, UA: POSITIVE — AB
Spec Grav, UA: 1.02 (ref 1.010–1.025)
Urobilinogen, UA: 0.2 E.U./dL
pH, UA: 7.5 (ref 5.0–8.0)

## 2020-07-27 LAB — GLUCOSE, POCT (MANUAL RESULT ENTRY): POC Glucose: 115 mg/dl — AB (ref 70–99)

## 2020-07-27 NOTE — Progress Notes (Signed)
[facility]    Established Patient Office Visit  Subjective:  Patient ID: Candace Diaz, female    DOB: 1956/08/20  Age: 64 y.o. MRN: [id number]  CC:  Chief Complaint  Patient presents with  . Follow-up    Pt states she is here for a f/u on her BP. Pt states she wants to know why is breathing hard. Pt states it happens sometimes. Pt states she did have a inhaler but nol onger uses it.    HPI Candace Diaz is a 64 year old female who presents for Follow Up today.   Past Medical History:  Diagnosis Date  . Chronic pain of right knee   . Diabetes mellitus    type 2   . Hypertension     Past Surgical History:  Procedure Laterality Date  . BREAST BIOPSY Left unsure  . CATARACT EXTRACTION Right   . surgery on ears   10 or 64 yo   " they did surgery to make me hear better but it just made it worse"   . TONSILLECTOMY  childhood  . TOTAL KNEE ARTHROPLASTY Right 10/28/2019   Procedure: TOTAL KNEE ARTHROPLASTY;  Surgeon: Teryl LucyLandau, Joshua, MD;  Location: [facility];  Service: Orthopedics;  Laterality: Right;   Current Status: Since her last office visit, she is doing well with no complaints. She denies visual changes, chest pain, cough, shortness of breath, heart palpitations, and falls. She has occasional headaches and dizziness with position changes. Denies severe headaches, confusion, seizures, double vision, and blurred vision, nausea and vomiting. Her most recent normal range of preprandial blood glucose levels have been between 125-135. She has seen low range of 110 and high of 135 since his last office visit. She denies fatigue, frequent urination, blurred vision, excessive hunger, excessive thirst, weight gain, weight loss, and poor wound healing. She continues to check her feet regularly. She denies fevers, chills, recent infections, weight loss, and night sweats. Denies GI problems such as diarrhea, and constipation. She has no reports  of blood in stools, dysuria and hematuria. No depression or anxiety reported today. She is taking all medications as prescribed. She denies pain today.   Family History  Problem Relation Age of Onset  . Diabetes Brother   . Hypertension Brother   . Diabetes Other   . Hypertension Other     Social History   Socioeconomic History  . Marital status: Single    Spouse name: Not on file  . Number of children: Not on file  . Years of education: Not on file  . Highest education level: Not on file  Occupational History  . Not on file  Tobacco Use  . Smoking status: Former Smoker    Quit date: 2000    Years since quitting: 21.6  . Smokeless tobacco: Never Used  Vaping Use  . Vaping Use: Never used  Substance and Sexual Activity  . Alcohol use: No  . Drug use: No  . Sexual activity: Not Currently  Other Topics Concern  . Not on file  Social History Narrative  . Not on file   Social Determinants of Health   Financial Resource Strain:   . Difficulty of Paying Living Expenses:   Food Insecurity:   . Worried About Programme researcher, broadcasting/film/videounning Out of Food in the Last Year:   . Baristaan Out of Food in the Last Year:   Transportation Needs:   . Freight forwarderLack of Transportation (Medical):   Marland Kitchen. Lack  of Transportation (Non-Medical):   Physical Activity:   . Days of Exercise per Week:   . Minutes of Exercise per Session:   Stress:   . Feeling of Stress :   Social Connections:   . Frequency of Communication with Friends and Family:   . Frequency of Social Gatherings with Friends and Family:   . Attends Religious Services:   . Active Member of Clubs or Organizations:   . Attends Banker Meetings:   Marland Kitchen Marital Status:   Intimate Partner Violence:   . Fear of Current or Ex-Partner:   . Emotionally Abused:   Marland Kitchen Physically Abused:   . Sexually Abused:     Outpatient Medications Prior to Visit  Medication Sig Dispense Refill  . acetaminophen (TYLENOL) 500 MG tablet Take 1,000 mg by mouth every 6 (six)  hours as needed for moderate pain or headache.    Marland Kitchen amLODipine (NORVASC) 10 MG tablet Take 1 tablet by mouth once daily 90 tablet 0  . cholecalciferol (VITAMIN D3) 25 MCG (1000 UT) tablet Take 2,000 Units by mouth daily.    Marland Kitchen EASY TOUCH INSULIN SYRINGE 31G X 5/16" 0.5 ML MISC USE TO INJECT INSULIN EVERY DAY 100 each 1  . glucose blood (ACCU-CHEK GUIDE) test strip Use as instructed 102 each 12  . Insulin Pen Needle (NOVOFINE) 30G X 8 MM MISC Administer insulin subcutaneously once daily. ICD 10 E.11 100 each 5  . JANUVIA 25 MG tablet Take 1 tablet by mouth once daily 90 tablet 0  . Lancets (ACCU-CHEK MULTICLIX) lancets USE TO CHECK BLOOD SUGAR TWICE DAILY 102 each 2  . Lancets (ACCU-CHEK MULTICLIX) lancets USE TO CHECK BLOOD SUGAR TWICE DAILY 102 each 0  . LANTUS SOLOSTAR 100 UNIT/ML Solostar Pen INJECT 10 UNITS INTO THE SKIN DAILY AT 10 PM. 15 mL 44  . lisinopril (ZESTRIL) 20 MG tablet Take 1 tablet by mouth once daily 90 tablet 0  . metFORMIN (GLUCOPHAGE) 1000 MG tablet TAKE 1 TABLET BY MOUTH TWICE DAILY WITH A MEAL 180 tablet 0  . Multiple Vitamins-Minerals (MULTIVITAMIN WITH MINERALS) tablet Take 2 tablets by mouth daily.     . sennosides-docusate sodium (SENOKOT-S) 8.6-50 MG tablet Take 2 tablets by mouth daily. 30 tablet 1  . hydrochlorothiazide (HYDRODIURIL) 25 MG tablet Take 1 tablet by mouth once daily 90 tablet 0  . simvastatin (ZOCOR) 10 MG tablet TAKE 1 TABLET BY MOUTH ONCE DAILY IN THE EVENING 30 tablet 2  . albuterol (PROAIR HFA) 108 (90 Base) MCG/ACT inhaler INHALE 2 PUFFS BY MOUTH FOUR TIMES A DAY AS NEEDED FOR SHORTNESS OF BREATH. (Patient not taking: Reported on 06/25/2020) 8.5 each 3  . baclofen (LIORESAL) 10 MG tablet Take 1 tablet (10 mg total) by mouth 3 (three) times daily. As needed for muscle spasm (Patient not taking: Reported on 07/27/2020) 50 tablet 0  . ondansetron (ZOFRAN) 4 MG tablet Take 1 tablet (4 mg total) by mouth every 8 (eight) hours as needed for nausea or vomiting.  (Patient not taking: Reported on 07/27/2020) 10 tablet 0  . oxyCODONE (ROXICODONE) 5 MG immediate release tablet Take 1 tablet (5 mg total) by mouth every 4 (four) hours as needed for severe pain. (Patient not taking: Reported on 07/27/2020) 30 tablet 0  . polyethylene glycol (MIRALAX / GLYCOLAX) 17 g packet Take 17 g by mouth daily as needed for mild constipation.    . Polyvinyl Alcohol-Povidone (REFRESH OP) Place 1 drop into both eyes daily as needed (dry eyes). (  Patient not taking: Reported on 07/27/2020)    . enoxaparin (LOVENOX) 30 MG/0.3ML injection Inject 0.3 mLs (30 mg total) into the skin every 12 (twelve) hours. 18 mL 0   No facility-administered medications prior to visit.    Allergies  Allergen Reactions  . Aspirin Other (See Comments)    Internal bleeding  . Penicillins Hives    Did it involve swelling of the face/tongue/throat, SOB, or low BP? Unknown Did it involve sudden or severe rash/hives, skin peeling, or any reaction on the inside of your mouth or nose? Unknown Did you need to seek medical attention at a hospital or doctor's office? Unknown When did it last happen?years If all above answers are "NO", may proceed with cephalosporin use.    ROS Review of Systems  Constitutional: Negative.   HENT: Negative.   Eyes: Negative.   Respiratory: Negative.   Cardiovascular: Negative.   Gastrointestinal: Positive for abdominal distention.  Endocrine: Negative.   Genitourinary: Negative.   Musculoskeletal: Positive for arthralgias (generalized joint pain; right knee pain).  Skin: Negative.   Allergic/Immunologic: Negative.   Neurological: Positive for dizziness (occasional ), weakness (generalized weakness) and headaches (occasional ).  Hematological: Negative.   Psychiatric/Behavioral: Negative.       Objective:    Physical Exam Vitals and nursing note reviewed.  Constitutional:      Appearance: Normal appearance.  HENT:     Head: Normocephalic and atraumatic.      Nose: Nose normal.     Mouth/Throat:     Mouth: Mucous membranes are moist.     Pharynx: Oropharynx is clear.  Cardiovascular:     Rate and Rhythm: Normal rate and regular rhythm.     Pulses: Normal pulses.     Heart sounds: Normal heart sounds.  Pulmonary:     Effort: Pulmonary effort is normal.     Breath sounds: Normal breath sounds.  Abdominal:     General: Bowel sounds are normal. There is distension.     Palpations: Abdomen is soft.  Musculoskeletal:     Cervical back: Normal range of motion and neck supple.     Comments: Limited ROM in right knee  Skin:    General: Skin is warm and dry.  Neurological:     General: No focal deficit present.     Mental Status: She is alert and oriented to person, place, and time.     Motor: Weakness (right knee) present.     Gait: Gait abnormal.  Psychiatric:        Mood and Affect: Mood normal.        Behavior: Behavior normal.        Thought Content: Thought content normal.        Judgment: Judgment normal.     BP 128/72 (BP Location: Left Arm, Patient Position: Sitting, Cuff Size: Large)   Pulse 78   Temp 97.9 F (36.6 C)   Resp 17   Ht 5\' 9"  (1.753 m)   Wt 222 lb 12.8 oz (101.1 kg)   SpO2 98%   BMI 32.90 kg/m  Wt Readings from Last 3 Encounters:  07/27/20 222 lb 12.8 oz (101.1 kg)  06/25/20 221 lb (100.2 kg)  01/28/20 224 lb (101.6 kg)     Health Maintenance Due  Topic Date Due  . COVID-19 Vaccine (1) Never done  . OPHTHALMOLOGY EXAM  01/28/2016  . FOOT EXAM  03/02/2019  . INFLUENZA VACCINE  07/25/2020    There are no preventive care reminders  to display for this patient.  Lab Results  Component Value Date   TSH 1.840 03/01/2015   Lab Results  Component Value Date   WBC 10.9 (H) 10/29/2019   HGB 9.2 (L) 10/29/2019   HCT 29.7 (L) 10/29/2019   MCV 93.1 10/29/2019   PLT 250 10/29/2019   Lab Results  Component Value Date   NA 140 10/29/2019   K 3.6 10/29/2019   CO2 25 10/29/2019   GLUCOSE 106 (H)  10/29/2019   BUN 18 10/29/2019   CREATININE 0.79 10/29/2019   BILITOT 0.5 04/11/2019   ALKPHOS 51 04/11/2019   AST 33 04/11/2019   ALT 36 (H) 04/11/2019   PROT 7.8 04/11/2019   ALBUMIN 4.5 04/11/2019   CALCIUM 8.2 (L) 10/29/2019   ANIONGAP 11 10/29/2019   Lab Results  Component Value Date   CHOL 141 04/11/2019   Lab Results  Component Value Date   HDL 54 04/11/2019   Lab Results  Component Value Date   LDLCALC 74 04/11/2019   Lab Results  Component Value Date   TRIG 66 04/11/2019   Lab Results  Component Value Date   CHOLHDL 2.6 04/11/2019   Lab Results  Component Value Date   HGBA1C 6.3 (A) 07/27/2020   HGBA1C 6.3 07/27/2020   HGBA1C 6.3 07/27/2020   HGBA1C 6.3 07/27/2020      Assessment & Plan:   1. History of CVA with residual deficit Stable today. No signs or symptoms or recurrence noted or reported today.   2. Type 2 diabetes mellitus with complication, without long-term current use of insulin (HCC) She will continue medication as prescribed, to decrease foods/beverages high in sugars and carbs and follow Heart Healthy or DASH diet. Increase physical activity to at least 30 minutes cardio exercise daily.  - POC Glucose (CBG) - POC HgB A1c - Urinalysis Dipstick  3. Hemoglobin A1c less than 7.0%  4. Hyperglycemia  5. HTN (hypertension), malignant The current medical regimen is effective; blood pressure is stable at 128/72 today; continue present plan and medications as prescribed. She will continue to take medications as prescribed, to decrease high sodium intake, excessive alcohol intake, increase potassium intake, smoking cessation, and increase physical activity of at least 30 minutes of cardio activity daily. She will continue to follow Heart Healthy or DASH diet.  6. Visual changes Basic visual acuity exam performed today. We will refer her to Optometry.   7. Unsteady gait when walking She is currently using cane to aid in ambulation.   8.  Abnormality of gait and mobility  9. History of total right knee replacement (TKR) Stable.  10. Abnormal urinalysis  11. Follow up She will follow up in 6 months.    No orders of the defined types were placed in this encounter.   Orders Placed This Encounter  Procedures  . Urine Culture  . POC Glucose (CBG)  . POC HgB A1c  . Urinalysis Dipstick    Referral Orders  No referral(s) requested today    Raliegh Ip,  MSN, FNP-BC [facility] [address] [place], [place] [address] [phone]   Problem List Items Addressed This Visit      Cardiovascular and Mediastinum   HTN (hypertension), malignant (Chronic)     Other   History of CVA with residual deficit - Primary    Other Visit Diagnoses    Type 2 diabetes mellitus with complication, without long-term current use  of insulin (HCC)       Relevant Orders   POC Glucose (CBG) (Completed)   POC HgB A1c (Completed)   Urinalysis Dipstick (Completed)   Hemoglobin A1c less than 7.0%       Hyperglycemia       Visual changes       Unsteady gait when walking       Abnormality of gait and mobility       History of total right knee replacement (TKR)       Abnormal urine finding       Relevant Orders   Urine Culture (Completed)   Follow up          No orders of the defined types were placed in this encounter.   Follow-up: No follow-ups on file.    Kallie Locks, FNP

## 2020-07-27 NOTE — Progress Notes (Signed)
Pt used the snellen chart to see what her vision acuity her R eye is 20/50 and L eye 20/70 both corrective vision 20/40. Pt states she does not wear glasses.

## 2020-08-01 ENCOUNTER — Encounter: Payer: Self-pay | Admitting: Family Medicine

## 2020-08-01 LAB — URINE CULTURE

## 2020-08-02 ENCOUNTER — Other Ambulatory Visit: Payer: Self-pay | Admitting: Family Medicine

## 2020-08-02 DIAGNOSIS — I10 Essential (primary) hypertension: Secondary | ICD-10-CM

## 2020-08-03 ENCOUNTER — Other Ambulatory Visit: Payer: Self-pay | Admitting: Family Medicine

## 2020-08-03 ENCOUNTER — Encounter: Payer: Self-pay | Admitting: Family Medicine

## 2020-08-03 DIAGNOSIS — E785 Hyperlipidemia, unspecified: Secondary | ICD-10-CM

## 2020-08-03 DIAGNOSIS — E118 Type 2 diabetes mellitus with unspecified complications: Secondary | ICD-10-CM

## 2020-08-03 MED ORDER — SIMVASTATIN 10 MG PO TABS: 10.0000 mg | ORAL_TABLET | Freq: Every evening | ORAL | 3 refills | Status: DC

## 2020-08-04 ENCOUNTER — Ambulatory Visit
Admission: RE | Admit: 2020-08-04 | Discharge: 2020-08-04 | Disposition: A | Discharge status: Home / Self Care | Payer: Medicaid Other | Source: Ambulatory Visit | Attending: Family Medicine | Admitting: Family Medicine

## 2020-08-04 ENCOUNTER — Other Ambulatory Visit: Payer: Self-pay | Admitting: Family Medicine

## 2020-08-04 ENCOUNTER — Other Ambulatory Visit: Payer: Self-pay

## 2020-08-04 DIAGNOSIS — Z1231 Encounter for screening mammogram for malignant neoplasm of breast: Secondary | ICD-10-CM | POA: Diagnosis not present

## 2020-08-08 ENCOUNTER — Encounter: Payer: Self-pay | Admitting: Family Medicine

## 2020-08-12 NOTE — Progress Notes (Signed)
Pt was called @ 4:23 pm but she didn't answer. A  phone a message for her to give me a call back.

## 2020-08-18 ENCOUNTER — Telehealth: Payer: Self-pay

## 2020-08-18 NOTE — Telephone Encounter (Signed)
Pt was called to dis her RX that was sent to the pharmacy. Pt stated she understood how Important it is to take this medication and will keep her f/u appt.

## 2020-08-18 NOTE — Telephone Encounter (Signed)
-----   Message from Kallie Locks, FNP sent at 08/03/2020  7:23 PM EDT ----- Regarding: "Statin use with Diabetes" Rx refills for Simvastatin sent to pharmacy today. Please inform patient that this medication is important as Diabetics have a higher risk of developing Arthrosclerosis which puts them at risk of a Stroke or Heart Attack. Please inform patient. Thank you.

## 2020-08-19 ENCOUNTER — Telehealth: Payer: Self-pay | Admitting: Family Medicine

## 2020-08-19 ENCOUNTER — Other Ambulatory Visit: Payer: Self-pay | Admitting: Family Medicine

## 2020-08-19 NOTE — Telephone Encounter (Signed)
Pt is requesting a call back to discuss meds. States pharm. Does not have meds. I'm unable to verify which med she's inquiring about

## 2020-08-20 ENCOUNTER — Telehealth: Payer: Self-pay | Admitting: Family Medicine

## 2020-08-20 ENCOUNTER — Other Ambulatory Visit: Payer: Self-pay | Admitting: Family Medicine

## 2020-08-20 DIAGNOSIS — E785 Hyperlipidemia, unspecified: Secondary | ICD-10-CM

## 2020-08-20 DIAGNOSIS — R0600 Dyspnea, unspecified: Secondary | ICD-10-CM

## 2020-08-20 DIAGNOSIS — I10 Essential (primary) hypertension: Secondary | ICD-10-CM

## 2020-08-20 DIAGNOSIS — R0609 Other forms of dyspnea: Secondary | ICD-10-CM

## 2020-08-20 DIAGNOSIS — E118 Type 2 diabetes mellitus with unspecified complications: Secondary | ICD-10-CM

## 2020-08-20 MED ORDER — ALBUTEROL SULFATE HFA 108 (90 BASE) MCG/ACT IN AERS: INHALATION_SPRAY | RESPIRATORY_TRACT | 11 refills | Status: DC

## 2020-08-20 MED ORDER — ONDANSETRON HCL 4 MG PO TABS: 4.0000 mg | ORAL_TABLET | Freq: Three times a day (TID) | ORAL | 0 refills | Status: DC | PRN

## 2020-08-20 MED ORDER — POLYETHYLENE GLYCOL 3350 17 G PO PACK: 17.0000 g | PACK | Freq: Every day | ORAL | 99 refills | Status: DC | PRN

## 2020-08-20 MED ORDER — BACLOFEN 10 MG PO TABS: 10.0000 mg | ORAL_TABLET | Freq: Three times a day (TID) | ORAL | 0 refills | Status: DC

## 2020-08-20 MED ORDER — SIMVASTATIN 10 MG PO TABS: 10.0000 mg | ORAL_TABLET | Freq: Every evening | ORAL | 3 refills | Status: DC

## 2020-08-20 MED ORDER — AMLODIPINE BESYLATE 10 MG PO TABS: 10.0000 mg | ORAL_TABLET | Freq: Every day | ORAL | 3 refills | Status: DC

## 2020-08-20 MED ORDER — METFORMIN HCL 1000 MG PO TABS
ORAL_TABLET | ORAL | 3 refills | Status: DC
Start: 2020-08-20 — End: 2021-09-12

## 2020-08-20 MED ORDER — LISINOPRIL 20 MG PO TABS: 20.0000 mg | ORAL_TABLET | Freq: Every day | ORAL | 3 refills | Status: DC

## 2020-08-20 MED ORDER — HYDROCHLOROTHIAZIDE 25 MG PO TABS: 25.0000 mg | ORAL_TABLET | Freq: Every day | ORAL | 3 refills | Status: DC

## 2020-08-20 MED ORDER — SITAGLIPTIN PHOSPHATE 25 MG PO TABS: 25.0000 mg | ORAL_TABLET | Freq: Every day | ORAL | 3 refills | Status: DC

## 2020-08-20 NOTE — Telephone Encounter (Signed)
All Rx refills sent to pharmacy today. Multiple attempts to contact patient. Message left on daughter's voicemail to return call.

## 2020-08-20 NOTE — Telephone Encounter (Signed)
Pt was calle

## 2020-09-06 DIAGNOSIS — R2689 Other abnormalities of gait and mobility: Secondary | ICD-10-CM | POA: Diagnosis not present

## 2020-09-06 DIAGNOSIS — R278 Other lack of coordination: Secondary | ICD-10-CM | POA: Diagnosis not present

## 2020-09-06 DIAGNOSIS — I69398 Other sequelae of cerebral infarction: Secondary | ICD-10-CM | POA: Diagnosis not present

## 2020-09-09 NOTE — Telephone Encounter (Signed)
-----   Message from Kallie Locks, FNP sent at 08/31/2020  3:45 PM EDT ----- Regarding: "Rollator" We placed order for Rollator awhile ago for this patient. Item is currently on backorder with [facility], but a shipment should be in sometimes this week. Rollator should arrive to her home within the next week. Please inform patient.

## 2020-10-28 ENCOUNTER — Other Ambulatory Visit: Payer: Self-pay | Admitting: Family Medicine

## 2020-11-13 ENCOUNTER — Other Ambulatory Visit: Payer: Self-pay | Admitting: Family Medicine

## 2020-12-30 ENCOUNTER — Other Ambulatory Visit: Payer: Self-pay | Admitting: Family Medicine

## 2020-12-31 NOTE — Telephone Encounter (Signed)
Is this okay to refill? 

## 2021-01-26 ENCOUNTER — Other Ambulatory Visit: Payer: Self-pay

## 2021-01-26 ENCOUNTER — Ambulatory Visit (INDEPENDENT_AMBULATORY_CARE_PROVIDER_SITE_OTHER): Payer: Medicaid Other | Admitting: Family Medicine

## 2021-01-26 ENCOUNTER — Encounter: Payer: Self-pay | Admitting: Family Medicine

## 2021-01-26 VITALS — BP 136/89 | HR 77 | Temp 98.2°F | Ht 69.0 in | Wt 224.4 lb

## 2021-01-26 DIAGNOSIS — Z0000 Encounter for general adult medical examination without abnormal findings: Secondary | ICD-10-CM | POA: Diagnosis not present

## 2021-01-26 DIAGNOSIS — I6930 Unspecified sequelae of cerebral infarction: Secondary | ICD-10-CM

## 2021-01-26 DIAGNOSIS — Z09 Encounter for follow-up examination after completed treatment for conditions other than malignant neoplasm: Secondary | ICD-10-CM

## 2021-01-26 DIAGNOSIS — E118 Type 2 diabetes mellitus with unspecified complications: Secondary | ICD-10-CM | POA: Diagnosis not present

## 2021-01-26 DIAGNOSIS — H539 Unspecified visual disturbance: Secondary | ICD-10-CM

## 2021-01-26 DIAGNOSIS — R7309 Other abnormal glucose: Secondary | ICD-10-CM

## 2021-01-26 DIAGNOSIS — R531 Weakness: Secondary | ICD-10-CM | POA: Diagnosis not present

## 2021-01-26 DIAGNOSIS — H9193 Unspecified hearing loss, bilateral: Secondary | ICD-10-CM

## 2021-01-26 DIAGNOSIS — R0609 Other forms of dyspnea: Secondary | ICD-10-CM

## 2021-01-26 DIAGNOSIS — R0600 Dyspnea, unspecified: Secondary | ICD-10-CM | POA: Diagnosis not present

## 2021-01-26 DIAGNOSIS — R739 Hyperglycemia, unspecified: Secondary | ICD-10-CM

## 2021-01-26 LAB — POCT GLYCOSYLATED HEMOGLOBIN (HGB A1C)
HbA1c POC (<> result, manual entry): 6.1 % (ref 4.0–5.6)
HbA1c, POC (controlled diabetic range): 6.1 % (ref 0.0–7.0)
HbA1c, POC (prediabetic range): 6.1 % (ref 5.7–6.4)
Hemoglobin A1C: 6.1 % — AB (ref 4.0–5.6)

## 2021-01-26 NOTE — Progress Notes (Signed)
[facility]  Annual Physical  Subjective:  Patient ID: Candace Diaz, female    DOB: 26-Aug-1956  Age: 65 y.o. MRN: [id number]  CC:  Chief Complaint  Patient presents with  . Follow-up    6 month follow up for diabetes , pt has no concerns     HPI Candace ShutterZola Mae Sparks is a 65 year old female who presents for her Annual Physical today.    Patient Active Problem List   Diagnosis Date Noted  . Osteoarthritis of right knee 10/28/2019  . S/P TKR (total knee replacement) using cement, right 10/28/2019  . HTN (hypertension), malignant 12/03/2014  . DOE (dyspnea on exertion) 12/03/2014  . Acute serous otitis media 08/20/2014  . Essential hypertension, malignant 08/20/2014  . Other and unspecified hyperlipidemia 08/20/2014  . T2DM (type 2 diabetes mellitus) (HCC) 08/20/2014  . Immunization due 08/20/2014  . Need for Tdap vaccination 08/20/2014  . History of palpitations 08/20/2014  . History of CVA with residual deficit 08/20/2014  . Arthritis of right knee 08/20/2014   Current Status: Since her last office visit, she is doing well with no complaints. She reports occasional shortness of breath. She experience residual left sided weakness, r/t history of Stroke. Her most recent normal range of preprandial blood glucose levels have been between 100-110. She has seen low range of 50, which she eats or drinks small snack; and high of 130 since his last office visit. She denies fatigue, frequent urination, blurred vision, excessive hunger, excessive thirst, weight gain, weight loss, and poor wound healing. She continues to check her feet regularly. She denies fevers, chills, fatigue, recent infections, weight loss, and night sweats. She has not had any headaches, dizziness, and falls. She continues to use Rollator for ambulation as needed. No chest pain, heart palpitations, cough and shortness of breath reported. Denies GI problems such as nausea,  vomiting, diarrhea, and constipation. She has no reports of blood in stools, dysuria and hematuria. No depression or anxiety reported today. She is taking all medications as prescribed. She denies pain today.   Past Medical History:  Diagnosis Date  . Chronic pain of right knee   . Diabetes mellitus    type 2   . History of CVA (cerebrovascular accident)   . History of total knee arthroplasty, right 10/2019  . Hypertension   . Unsteady gait 07/2020    Past Surgical History:  Procedure Laterality Date  . BREAST BIOPSY Left unsure  . CATARACT EXTRACTION Right   . surgery on ears   10 or 65 yo   " they did surgery to make me hear better but it just made it worse"   . TONSILLECTOMY  childhood  . TOTAL KNEE ARTHROPLASTY Right 10/28/2019   Procedure: TOTAL KNEE ARTHROPLASTY;  Surgeon: Teryl LucyLandau, Joshua, MD;  Location: [facility];  Service: Orthopedics;  Laterality: Right;    Family History  Problem Relation Age of Onset  . Diabetes Brother   . Hypertension Brother   . Diabetes Other   . Hypertension Other     Social History   Socioeconomic History  . Marital status: Single    Spouse name: Not on file  . Number of children: Not on file  . Years of education: Not on file  . Highest education level: Not on file  Occupational History  . Not on file  Tobacco Use  . Smoking status: Former Smoker    Quit date: 2000    Years  since quitting: 22.1  . Smokeless tobacco: Never Used  Vaping Use  . Vaping Use: Never used  Substance and Sexual Activity  . Alcohol use: No  . Drug use: No  . Sexual activity: Not Currently  Other Topics Concern  . Not on file  Social History Narrative  . Not on file   Social Determinants of Health   Financial Resource Strain: Not on file  Food Insecurity: Not on file  Transportation Needs: Not on file  Physical Activity: Not on file  Stress: Not on file  Social Connections: Not on file  Intimate Partner Violence: Not on file    Outpatient  Medications Prior to Visit  Medication Sig Dispense Refill  . acetaminophen (TYLENOL) 500 MG tablet Take 1,000 mg by mouth every 6 (six) hours as needed for moderate pain or headache.    . albuterol (PROAIR HFA) 108 (90 Base) MCG/ACT inhaler INHALE 2 PUFFS BY MOUTH FOUR TIMES A DAY AS NEEDED FOR SHORTNESS OF BREATH. 8.5 each 11  . amLODipine (NORVASC) 10 MG tablet Take 1 tablet (10 mg total) by mouth daily. 90 tablet 3  . B-D ULTRAFINE III SHORT PEN 31G X 8 MM MISC USE WITH INSULIN INJECTIONS ONCE DAILY 300 each 3  . baclofen (LIORESAL) 10 MG tablet Take 1 tablet (10 mg total) by mouth 3 (three) times daily. As needed for muscle spasm 50 tablet 0  . cholecalciferol (VITAMIN D3) 25 MCG (1000 UT) tablet Take 2,000 Units by mouth daily.    Marland Kitchen EASY TOUCH INSULIN SYRINGE 31G X 5/16" 0.5 ML MISC USE TO INJECT INSULIN EVERY DAY 100 each 1  . glucose blood (ACCU-CHEK GUIDE) test strip Use as instructed 102 each 12  . hydrochlorothiazide (HYDRODIURIL) 25 MG tablet Take 1 tablet (25 mg total) by mouth daily. 90 tablet 3  . Insulin Pen Needle (NOVOFINE) 30G X 8 MM MISC Administer insulin subcutaneously once daily. ICD 10 E.11 100 each 5  . Lancets (ACCU-CHEK MULTICLIX) lancets USE TO CHECK BLOOD SUGAR TWICE DAILY 102 each 2  . Lancets (ACCU-CHEK MULTICLIX) lancets USE TO CHECK BLOOD SUGAR TWICE DAILY 102 each 0  . LANTUS SOLOSTAR 100 UNIT/ML Solostar Pen INJECT 10 UNITS INTO THE SKIN DAILY AT 10 PM. 15 mL 44  . lisinopril (ZESTRIL) 20 MG tablet Take 1 tablet (20 mg total) by mouth daily. 90 tablet 3  . metFORMIN (GLUCOPHAGE) 1000 MG tablet Take 1 tablet by mouth 2 times a day. 180 tablet 3  . Multiple Vitamins-Minerals (MULTIVITAMIN WITH MINERALS) tablet Take 2 tablets by mouth daily.     . ondansetron (ZOFRAN) 4 MG tablet TAKE 1 TABLET BY MOUTH EVERY 8 HOURS AS NEEDED FOR NAUSEA FOR VOMITING 20 tablet 2  . oxyCODONE (ROXICODONE) 5 MG immediate release tablet Take 1 tablet (5 mg total) by mouth every 4 (four)  hours as needed for severe pain. 30 tablet 0  . polyethylene glycol (MIRALAX / GLYCOLAX) 17 g packet Take 17 g by mouth daily as needed for mild constipation. 14 each prn  . Polyvinyl Alcohol-Povidone (REFRESH OP) Place 1 drop into both eyes daily as needed (dry eyes).    . sennosides-docusate sodium (SENOKOT-S) 8.6-50 MG tablet Take 2 tablets by mouth daily. 30 tablet 1  . simvastatin (ZOCOR) 10 MG tablet Take 1 tablet (10 mg total) by mouth every evening. 90 tablet 3  . sitaGLIPtin (JANUVIA) 25 MG tablet Take 1 tablet (25 mg total) by mouth daily. 90 tablet 3   No facility-administered  medications prior to visit.    Allergies  Allergen Reactions  . Aspirin Other (See Comments)    Internal bleeding  . Penicillins Hives    Did it involve swelling of the face/tongue/throat, SOB, or low BP? Unknown Did it involve sudden or severe rash/hives, skin peeling, or any reaction on the inside of your mouth or nose? Unknown Did you need to seek medical attention at a hospital or doctor's office? Unknown When did it last happen?years If all above answers are "NO", may proceed with cephalosporin use.    ROS Review of Systems  Constitutional: Negative.   HENT: Positive for hearing loss.   Eyes: Positive for visual disturbance (visual changes).  Respiratory: Positive for shortness of breath (occasional ).   Cardiovascular: Negative.   Gastrointestinal: Positive for abdominal distention (obese).  Endocrine: Negative.   Genitourinary: Negative.   Musculoskeletal: Positive for arthralgias (generalized joint pain) and gait problem (left sided residual weakness r/t Stroke history).  Skin: Negative.   Allergic/Immunologic: Negative.   Neurological: Positive for dizziness (occasional ) and headaches (occasional ).  Hematological: Negative.   Psychiatric/Behavioral: Negative.       Objective:    Physical Exam Vitals and nursing note reviewed.  Constitutional:      Appearance: Normal  appearance.  HENT:     Head: Normocephalic and atraumatic.     Nose: Nose normal.     Mouth/Throat:     Mouth: Mucous membranes are moist.     Pharynx: Oropharynx is clear.  Cardiovascular:     Rate and Rhythm: Normal rate and regular rhythm.     Pulses: Normal pulses.     Heart sounds: Normal heart sounds.  Pulmonary:     Effort: Pulmonary effort is normal.     Breath sounds: Normal breath sounds.  Abdominal:     General: Bowel sounds are normal. There is distension (obese).     Palpations: Abdomen is soft.  Musculoskeletal:     Cervical back: Normal range of motion and neck supple.     Comments: Left extremity weakness  Skin:    General: Skin is warm and dry.  Neurological:     General: No focal deficit present.     Mental Status: She is alert and oriented to person, place, and time.     Motor: Weakness (left extremity) present.  Psychiatric:        Mood and Affect: Mood normal.        Behavior: Behavior normal.        Thought Content: Thought content normal.        Judgment: Judgment normal.     BP 136/89 (BP Location: Left Arm, Patient Position: Sitting)   Pulse 77   Temp 98.2 F (36.8 C) (Temporal)   Ht 5\' 9"  (1.753 m)   Wt 224 lb 6.4 oz (101.8 kg)   SpO2 99%   BMI 33.14 kg/m  Wt Readings from Last 3 Encounters:  01/26/21 224 lb 6.4 oz (101.8 kg)  07/27/20 222 lb 12.8 oz (101.1 kg)  06/25/20 221 lb (100.2 kg)     Health Maintenance Due  Topic Date Due  . COVID-19 Vaccine (1) Never done  . OPHTHALMOLOGY EXAM  01/28/2016  . FOOT EXAM  03/02/2019  . INFLUENZA VACCINE  07/25/2020    There are no preventive care reminders to display for this patient.  Lab Results  Component Value Date   TSH 1.840 03/01/2015   Lab Results  Component Value Date   WBC 10.9 (  H) 10/29/2019   HGB 9.2 (L) 10/29/2019   HCT 29.7 (L) 10/29/2019   MCV 93.1 10/29/2019   PLT 250 10/29/2019   Lab Results  Component Value Date   NA 140 10/29/2019   K 3.6 10/29/2019   CO2  25 10/29/2019   GLUCOSE 106 (H) 10/29/2019   BUN 18 10/29/2019   CREATININE 0.79 10/29/2019   BILITOT 0.5 04/11/2019   ALKPHOS 51 04/11/2019   AST 33 04/11/2019   ALT 36 (H) 04/11/2019   PROT 7.8 04/11/2019   ALBUMIN 4.5 04/11/2019   CALCIUM 8.2 (L) 10/29/2019   ANIONGAP 11 10/29/2019   Lab Results  Component Value Date   CHOL 141 04/11/2019   Lab Results  Component Value Date   HDL 54 04/11/2019   Lab Results  Component Value Date   LDLCALC 74 04/11/2019   Lab Results  Component Value Date   TRIG 66 04/11/2019   Lab Results  Component Value Date   CHOLHDL 2.6 04/11/2019   Lab Results  Component Value Date   HGBA1C 6.1 (A) 01/26/2021   HGBA1C 6.1 01/26/2021   HGBA1C 6.1 01/26/2021   HGBA1C 6.1 01/26/2021      Assessment & Plan:   1. Type 2 diabetes mellitus with complication, without long-term current use of insulin (HCC) She will continue medication as prescribed, to decrease foods/beverages high in sugars and carbs and follow Heart Healthy or DASH diet. Increase physical activity to at least 30 minutes cardio exercise daily.  - POCT glycosylated hemoglobin (Hb A1C)  2. Hemoglobin A1c less than 7.0% Stable at 6.1. Monitor.   3. Hyperglycemia  4. Visual changes She will follow up with Optometry asap.   5. History of CVA with residual deficit No signs or symptoms of recurrence noted or reported.   6. Left-sided weakness  7. DOE (dyspnea on exertion)  8. Bilateral hearing loss, unspecified hearing loss type  9. Healthcare maintenance - CBC with Differential - Comprehensive metabolic panel - Lipid Panel - TSH - Vitamin B12 - Vitamin D, 25-hydroxy  10. Follow up She will follow up in 3 months.   No orders of the defined types were placed in this encounter.   Orders Placed This Encounter  Procedures  . CBC with Differential  . Comprehensive metabolic panel  . Lipid Panel  . TSH  . Vitamin B12  . Vitamin D, 25-hydroxy  . POCT  glycosylated hemoglobin (Hb A1C)    Referral Orders  No referral(s) requested today    Raliegh Ip, MSN, ANE, FNP-BC [facility] [address] [place], [place] [address] [phone] fax  Problem List Items Addressed This Visit      Other   DOE (dyspnea on exertion)   History of CVA with residual deficit    Other Visit Diagnoses    Type 2 diabetes mellitus with complication, without long-term current use of insulin (HCC)    -  Primary   Relevant Orders   POCT glycosylated hemoglobin (Hb A1C) (Completed)   Hemoglobin A1c less than 7.0%       Hyperglycemia       Visual changes       Left-sided weakness       Bilateral hearing loss, unspecified hearing loss type       Healthcare maintenance       Relevant Orders   CBC with Differential   Comprehensive metabolic panel   Lipid Panel  TSH   Vitamin B12   Vitamin D, 25-hydroxy   Follow up          No orders of the defined types were placed in this encounter.   Follow-up: No follow-ups on file.    Kallie Locks, FNP

## 2021-01-27 LAB — CBC WITH DIFFERENTIAL/PLATELET
Basophils Absolute: 0.1 10*3/uL (ref 0.0–0.2)
Basos: 1 %
EOS (ABSOLUTE): 0.1 10*3/uL (ref 0.0–0.4)
Eos: 2 %
Hematocrit: 36 % (ref 34.0–46.6)
Hemoglobin: 11.8 g/dL (ref 11.1–15.9)
Immature Grans (Abs): 0 10*3/uL (ref 0.0–0.1)
Immature Granulocytes: 0 %
Lymphocytes Absolute: 1.9 10*3/uL (ref 0.7–3.1)
Lymphs: 34 %
MCH: 28.2 pg (ref 26.6–33.0)
MCHC: 32.8 g/dL (ref 31.5–35.7)
MCV: 86 fL (ref 79–97)
Monocytes Absolute: 0.4 10*3/uL (ref 0.1–0.9)
Monocytes: 7 %
Neutrophils Absolute: 3.2 10*3/uL (ref 1.4–7.0)
Neutrophils: 56 %
Platelets: 348 10*3/uL (ref 150–450)
RBC: 4.18 x10E6/uL (ref 3.77–5.28)
RDW: 11.9 % (ref 11.7–15.4)
WBC: 5.7 10*3/uL (ref 3.4–10.8)

## 2021-01-27 LAB — LIPID PANEL
Chol/HDL Ratio: 2.5 ratio (ref 0.0–4.4)
Cholesterol, Total: 140 mg/dL (ref 100–199)
HDL: 57 mg/dL (ref >39)
LDL Chol Calc (NIH): 68 mg/dL (ref 0–99)
Triglycerides: 75 mg/dL (ref 0–149)
VLDL Cholesterol Cal: 15 mg/dL (ref 5–40)

## 2021-01-27 LAB — COMPREHENSIVE METABOLIC PANEL
ALT: 41 IU/L — ABNORMAL HIGH (ref 0–32)
AST: 38 IU/L (ref 0–40)
Albumin/Globulin Ratio: 1.4 (ref 1.2–2.2)
Albumin: 4.4 g/dL (ref 3.8–4.8)
Alkaline Phosphatase: 58 IU/L (ref 44–121)
BUN/Creatinine Ratio: 12 (ref 12–28)
BUN: 9 mg/dL (ref 8–27)
Bilirubin Total: 0.6 mg/dL (ref 0.0–1.2)
CO2: 26 mmol/L (ref 20–29)
Calcium: 9.4 mg/dL (ref 8.7–10.3)
Chloride: 101 mmol/L (ref 96–106)
Creatinine, Ser: 0.77 mg/dL (ref 0.57–1.00)
GFR calc Af Amer: 95 mL/min/{1.73_m2} (ref >59)
GFR calc non Af Amer: 82 mL/min/{1.73_m2} (ref >59)
Globulin, Total: 3.2 g/dL (ref 1.5–4.5)
Glucose: 96 mg/dL (ref 65–99)
Potassium: 3.5 mmol/L (ref 3.5–5.2)
Sodium: 141 mmol/L (ref 134–144)
Total Protein: 7.6 g/dL (ref 6.0–8.5)

## 2021-01-27 LAB — TSH: TSH: 1.62 u[IU]/mL (ref 0.450–4.500)

## 2021-01-27 LAB — VITAMIN B12: Vitamin B-12: 607 pg/mL (ref 232–1245)

## 2021-01-27 LAB — VITAMIN D 25 HYDROXY (VIT D DEFICIENCY, FRACTURES): Vit D, 25-Hydroxy: 36.2 ng/mL (ref 30.0–100.0)

## 2021-02-01 ENCOUNTER — Telehealth: Payer: Self-pay

## 2021-02-01 NOTE — Telephone Encounter (Signed)
-----   Message from Kallie Locks, FNP sent at 02/01/2021 11:02 AM EST ----- All labs are stable. Keep follow up appointment.

## 2021-02-01 NOTE — Telephone Encounter (Signed)
Left message for patient to call back regarding results and recommendations. °

## 2021-04-01 ENCOUNTER — Encounter: Payer: Self-pay | Admitting: Family Medicine

## 2021-04-01 ENCOUNTER — Ambulatory Visit: Payer: Medicaid Other | Admitting: Family Medicine

## 2021-04-01 ENCOUNTER — Other Ambulatory Visit: Payer: Self-pay

## 2021-04-01 VITALS — BP 153/93 | HR 82 | Temp 97.5°F | Ht 69.0 in | Wt 224.0 lb

## 2021-04-01 DIAGNOSIS — Z09 Encounter for follow-up examination after completed treatment for conditions other than malignant neoplasm: Secondary | ICD-10-CM | POA: Diagnosis not present

## 2021-04-01 DIAGNOSIS — R531 Weakness: Secondary | ICD-10-CM

## 2021-04-01 DIAGNOSIS — H539 Unspecified visual disturbance: Secondary | ICD-10-CM

## 2021-04-01 DIAGNOSIS — I10 Essential (primary) hypertension: Secondary | ICD-10-CM

## 2021-04-01 DIAGNOSIS — R7309 Other abnormal glucose: Secondary | ICD-10-CM | POA: Diagnosis not present

## 2021-04-01 DIAGNOSIS — I6930 Unspecified sequelae of cerebral infarction: Secondary | ICD-10-CM

## 2021-04-01 DIAGNOSIS — E118 Type 2 diabetes mellitus with unspecified complications: Secondary | ICD-10-CM | POA: Diagnosis not present

## 2021-04-01 DIAGNOSIS — R739 Hyperglycemia, unspecified: Secondary | ICD-10-CM

## 2021-04-01 DIAGNOSIS — H259 Unspecified age-related cataract: Secondary | ICD-10-CM | POA: Diagnosis not present

## 2021-04-01 DIAGNOSIS — H9193 Unspecified hearing loss, bilateral: Secondary | ICD-10-CM | POA: Diagnosis not present

## 2021-04-01 LAB — POCT GLYCOSYLATED HEMOGLOBIN (HGB A1C)
HbA1c POC (<> result, manual entry): 6.1 % (ref 4.0–5.6)
HbA1c, POC (controlled diabetic range): 6.1 % (ref 0.0–7.0)
HbA1c, POC (prediabetic range): 6.1 % (ref 5.7–6.4)
Hemoglobin A1C: 6.1 % — AB (ref 4.0–5.6)

## 2021-04-01 NOTE — Progress Notes (Signed)
[facility]   Established Patient Office Visit  Subjective:  Patient ID: Candace Diaz, female    DOB: 1956/03/25  Age: 65 y.o. MRN: [id number]  CC:  Chief Complaint  Patient presents with  . Follow-up    Follow up diabetes     HPI Candace Diaz is a 65 year old female who presents for Follow Up today.   Patient Active Problem List   Diagnosis Date Noted  . Osteoarthritis of right knee 10/28/2019  . S/P TKR (total knee replacement) using cement, right 10/28/2019  . HTN (hypertension), malignant 12/03/2014  . DOE (dyspnea on exertion) 12/03/2014  . Acute serous otitis media 08/20/2014  . Essential hypertension, malignant 08/20/2014  . Other and unspecified hyperlipidemia 08/20/2014  . T2DM (type 2 diabetes mellitus) (HCC) 08/20/2014  . Immunization due 08/20/2014  . Need for Tdap vaccination 08/20/2014  . History of palpitations 08/20/2014  . History of CVA with residual deficit 08/20/2014  . Arthritis of right knee 08/20/2014   Current Status: Since her last office visit, she is doing well with no complaints. She has been scheduled for left eye cataract surgery on 05/04/2021. She denies visual changes, chest pain, cough, shortness of breath, heart palpitations, and falls. She has occasional headaches and dizziness with position changes. Denies severe headaches, confusion, seizures, double vision, and blurred vision, nausea and vomiting. She denies fatigue, frequent urination, blurred vision, excessive hunger, excessive thirst, weight gain, weight loss,  and poor wound healing. She continues to check her feet regularly. She denies fevers, chills, recent infections, weight loss, and night sweats. Denies GI problems such as diarrhea, and constipation. She has no reports of blood in stools, dysuria and hematuria. No depression or anxiety reported today. She is taking all medications as prescribed. She denies pain today  Past Medical  History:  Diagnosis Date  . Chronic pain of right knee   . Diabetes mellitus    type 2   . History of CVA (cerebrovascular accident)   . History of total knee arthroplasty, right 10/2019  . Hypertension   . Unsteady gait 07/2020    Past Surgical History:  Procedure Laterality Date  . BREAST BIOPSY Left unsure  . CATARACT EXTRACTION Right   . surgery on ears   10 or 65 yo   " they did surgery to make me hear better but it just made it worse"   . TONSILLECTOMY  childhood  . TOTAL KNEE ARTHROPLASTY Right 10/28/2019   Procedure: TOTAL KNEE ARTHROPLASTY;  Surgeon: Teryl Lucy, MD;  Location: [facility];  Service: Orthopedics;  Laterality: Right;    Family History  Problem Relation Age of Onset  . Diabetes Brother   . Hypertension Brother   . Diabetes Other   . Hypertension Other     Social History   Socioeconomic History  . Marital status: Single    Spouse name: Not on file  . Number of children: Not on file  . Years of education: Not on file  . Highest education level: Not on file  Occupational History  . Not on file  Tobacco Use  . Smoking status: Former Smoker    Quit date: 2000    Years since quitting: 22.2  . Smokeless tobacco: Never Used  Vaping Use  . Vaping Use: Never used  Substance and Sexual Activity  . Alcohol use: No  . Drug use: No  . Sexual activity: Not Currently  Other Topics Concern  .  Not on file  Social History Narrative  . Not on file   Social Determinants of Health   Financial Resource Strain: Not on file  Food Insecurity: Not on file  Transportation Needs: Not on file  Physical Activity: Not on file  Stress: Not on file  Social Connections: Not on file  Intimate Partner Violence: Not on file    Outpatient Medications Prior to Visit  Medication Sig Dispense Refill  . acetaminophen (TYLENOL) 500 MG tablet Take 1,000 mg by mouth every 6 (six) hours as needed for moderate pain or headache.    . albuterol (PROAIR HFA) 108 (90 Base)  MCG/ACT inhaler INHALE 2 PUFFS BY MOUTH FOUR TIMES A DAY AS NEEDED FOR SHORTNESS OF BREATH. 8.5 each 11  . amLODipine (NORVASC) 10 MG tablet Take 1 tablet (10 mg total) by mouth daily. 90 tablet 3  . B-D ULTRAFINE III SHORT PEN 31G X 8 MM MISC USE WITH INSULIN INJECTIONS ONCE DAILY 300 each 3  . baclofen (LIORESAL) 10 MG tablet Take 1 tablet (10 mg total) by mouth 3 (three) times daily. As needed for muscle spasm 50 tablet 0  . cholecalciferol (VITAMIN D3) 25 MCG (1000 UT) tablet Take 2,000 Units by mouth daily.    Marland Kitchen EASY TOUCH INSULIN SYRINGE 31G X 5/16" 0.5 ML MISC USE TO INJECT INSULIN EVERY DAY 100 each 1  . glucose blood (ACCU-CHEK GUIDE) test strip Use as instructed 102 each 12  . hydrochlorothiazide (HYDRODIURIL) 25 MG tablet Take 1 tablet (25 mg total) by mouth daily. 90 tablet 3  . Insulin Pen Needle (NOVOFINE) 30G X 8 MM MISC Administer insulin subcutaneously once daily. ICD 10 E.11 100 each 5  . Lancets (ACCU-CHEK MULTICLIX) lancets USE TO CHECK BLOOD SUGAR TWICE DAILY 102 each 2  . Lancets (ACCU-CHEK MULTICLIX) lancets USE TO CHECK BLOOD SUGAR TWICE DAILY 102 each 0  . LANTUS SOLOSTAR 100 UNIT/ML Solostar Pen INJECT 10 UNITS INTO THE SKIN DAILY AT 10 PM. 15 mL 44  . lisinopril (ZESTRIL) 20 MG tablet Take 1 tablet (20 mg total) by mouth daily. 90 tablet 3  . metFORMIN (GLUCOPHAGE) 1000 MG tablet Take 1 tablet by mouth 2 times a day. 180 tablet 3  . Multiple Vitamins-Minerals (MULTIVITAMIN WITH MINERALS) tablet Take 2 tablets by mouth daily.     . ondansetron (ZOFRAN) 4 MG tablet TAKE 1 TABLET BY MOUTH EVERY 8 HOURS AS NEEDED FOR NAUSEA FOR VOMITING 20 tablet 2  . oxyCODONE (ROXICODONE) 5 MG immediate release tablet Take 1 tablet (5 mg total) by mouth every 4 (four) hours as needed for severe pain. 30 tablet 0  . polyethylene glycol (MIRALAX / GLYCOLAX) 17 g packet Take 17 g by mouth daily as needed for mild constipation. 14 each prn  . Polyvinyl Alcohol-Povidone (REFRESH OP) Place 1 drop  into both eyes daily as needed (dry eyes).    . sennosides-docusate sodium (SENOKOT-S) 8.6-50 MG tablet Take 2 tablets by mouth daily. 30 tablet 1  . simvastatin (ZOCOR) 10 MG tablet Take 1 tablet (10 mg total) by mouth every evening. 90 tablet 3  . sitaGLIPtin (JANUVIA) 25 MG tablet Take 1 tablet (25 mg total) by mouth daily. 90 tablet 3   No facility-administered medications prior to visit.    Allergies  Allergen Reactions  . Aspirin Other (See Comments)    Internal bleeding  . Penicillins Hives    Did it involve swelling of the face/tongue/throat, SOB, or low BP? Unknown Did it involve sudden  or severe rash/hives, skin peeling, or any reaction on the inside of your mouth or nose? Unknown Did you need to seek medical attention at a hospital or doctor's office? Unknown When did it last happen?years If all above answers are "NO", may proceed with cephalosporin use.    ROS Review of Systems  Constitutional: Negative.   HENT: Negative.        HOH  Eyes: Positive for visual disturbance (cataracts).  Respiratory: Negative.   Cardiovascular: Negative.   Gastrointestinal: Positive for abdominal distention (obese).  Endocrine: Negative.   Genitourinary: Negative.   Musculoskeletal: Positive for arthralgias (generalized).  Skin: Negative.   Allergic/Immunologic: Negative.   Neurological: Positive for dizziness (occasional ), weakness (left extremity) and headaches (occasional ).  Hematological: Negative.   Psychiatric/Behavioral: Negative.       Objective:    Physical Exam Vitals and nursing note reviewed.  Constitutional:      Appearance: Normal appearance.  HENT:     Head: Normocephalic and atraumatic.     Nose: Nose normal.     Mouth/Throat:     Mouth: Mucous membranes are moist.  Cardiovascular:     Rate and Rhythm: Normal rate and regular rhythm.     Pulses: Normal pulses.     Heart sounds: Normal heart sounds.  Pulmonary:     Effort: Pulmonary effort is  normal.     Breath sounds: Normal breath sounds.  Abdominal:     General: Bowel sounds are normal.     Palpations: Abdomen is soft.  Musculoskeletal:     Cervical back: Normal range of motion and neck supple.     Comments: Decreased ROM in left extremity  Skin:    General: Skin is warm.  Neurological:     General: No focal deficit present.     Mental Status: She is alert and oriented to person, place, and time.     Motor: Weakness (left extremity) present.     Gait: Gait abnormal.  Psychiatric:        Mood and Affect: Mood normal.        Behavior: Behavior normal.        Thought Content: Thought content normal.        Judgment: Judgment normal.    BP (!) 153/93 (BP Location: Left Arm, Patient Position: Sitting, Cuff Size: Large)   Pulse 82   Temp (!) 97.5 F (36.4 C) (Temporal)   Ht 5\' 9"  (1.753 m)   Wt 224 lb (101.6 kg)   SpO2 98%   BMI 33.08 kg/m  Wt Readings from Last 3 Encounters:  04/01/21 224 lb (101.6 kg)  01/26/21 224 lb 6.4 oz (101.8 kg)  07/27/20 222 lb 12.8 oz (101.1 kg)    Health Maintenance Due  Topic Date Due  . COVID-19 Vaccine (1) Never done  . OPHTHALMOLOGY EXAM  01/28/2016  . FOOT EXAM  03/02/2019    There are no preventive care reminders to display for this patient.  Lab Results  Component Value Date   TSH 1.620 01/26/2021   Lab Results  Component Value Date   WBC 5.7 01/26/2021   HGB 11.8 01/26/2021   HCT 36.0 01/26/2021   MCV 86 01/26/2021   PLT 348 01/26/2021   Lab Results  Component Value Date   NA 141 01/26/2021   K 3.5 01/26/2021   CO2 26 01/26/2021   GLUCOSE 96 01/26/2021   BUN 9 01/26/2021   CREATININE 0.77 01/26/2021   BILITOT 0.6 01/26/2021   ALKPHOS  58 01/26/2021   AST 38 01/26/2021   ALT 41 (H) 01/26/2021   PROT 7.6 01/26/2021   ALBUMIN 4.4 01/26/2021   CALCIUM 9.4 01/26/2021   ANIONGAP 11 10/29/2019   Lab Results  Component Value Date   CHOL 140 01/26/2021   Lab Results  Component Value Date   HDL 57  01/26/2021   Lab Results  Component Value Date   LDLCALC 68 01/26/2021   Lab Results  Component Value Date   TRIG 75 01/26/2021   Lab Results  Component Value Date   CHOLHDL 2.5 01/26/2021   Lab Results  Component Value Date   HGBA1C 6.1 (A) 04/01/2021   HGBA1C 6.1 04/01/2021   HGBA1C 6.1 04/01/2021   HGBA1C 6.1 04/01/2021      Assessment & Plan:   1. History of CVA with residual deficit No signs or symptoms of recurrence noted or reported today.   2. Left-sided weakness  3. Type 2 diabetes mellitus with complication, without long-term current use of insulin (HCC)  She will continue medication as prescribed, to decrease foods/beverages high in sugars and carbs and follow Heart Healthy or DASH diet. Increase physical activity to at least 30 minutes cardio exercise daily.  - HgB A1c  4. Hemoglobin A1c less than 7.0% Hgb A1c stable at 6.1 today. Monitor.   5. Hyperglycemia  6. Essential hypertension, malignant She will continue to take medications as prescribed, to decrease high sodium intake, excessive alcohol intake, increase potassium intake, smoking cessation, and increase physical activity of at least 30 minutes of cardio activity daily. She will continue to follow Heart Healthy or DASH diet.  7. Bilateral hearing loss, unspecified hearing loss type  8. Visual changes History of Cataracts.   9. Age-related cataract of both eyes, unspecified age-related cataract type Keep appointment for cataract surgery.   10. Follow up She will follow up in 6 months.  No orders of the defined types were placed in this encounter.   Orders Placed This Encounter  Procedures  . HgB A1c    Referral Orders  No referral(s) requested today    Raliegh IpNatalie Lourdez Mcgahan, MSN, ANE, FNP-BC [facility] [address]  [place], [place] [phone] fax  Problem List Items  Addressed This Visit      Cardiovascular and Mediastinum   Essential hypertension, malignant     Other   History of CVA with residual deficit - Primary    Other Visit Diagnoses    Left-sided weakness       Type 2 diabetes mellitus with complication, without long-term current use of insulin (HCC)       Relevant Orders   HgB A1c (Completed)   Hemoglobin A1c less than 7.0%       Hyperglycemia       Bilateral hearing loss, unspecified hearing loss type       Visual changes       Age-related cataract of both eyes, unspecified age-related cataract type       Follow up          No orders of the defined types were placed in this encounter.   Follow-up: No follow-ups on file.    Kallie LocksNatalie M Modena Bellemare, FNP

## 2021-04-05 ENCOUNTER — Encounter: Payer: Self-pay | Admitting: Family Medicine

## 2021-04-29 ENCOUNTER — Ambulatory Visit: Payer: Medicaid Other | Admitting: Family Medicine

## 2021-05-02 ENCOUNTER — Other Ambulatory Visit: Payer: Self-pay

## 2021-05-02 DIAGNOSIS — E118 Type 2 diabetes mellitus with unspecified complications: Secondary | ICD-10-CM

## 2021-05-02 MED ORDER — LANTUS SOLOSTAR 100 UNIT/ML ~~LOC~~ SOPN: PEN_INJECTOR | SUBCUTANEOUS | 44 refills | Status: DC

## 2021-05-13 DIAGNOSIS — H25812 Combined forms of age-related cataract, left eye: Secondary | ICD-10-CM | POA: Diagnosis not present

## 2021-09-12 ENCOUNTER — Telehealth: Payer: Self-pay

## 2021-09-12 ENCOUNTER — Other Ambulatory Visit: Payer: Self-pay | Admitting: Nurse Practitioner

## 2021-09-12 DIAGNOSIS — I10 Essential (primary) hypertension: Secondary | ICD-10-CM

## 2021-09-12 MED ORDER — METFORMIN HCL 1000 MG PO TABS: ORAL_TABLET | ORAL | 3 refills | Status: DC

## 2021-09-12 MED ORDER — HYDROCHLOROTHIAZIDE 25 MG PO TABS: 25.0000 mg | ORAL_TABLET | Freq: Every day | ORAL | 3 refills | Status: DC

## 2021-09-12 NOTE — Telephone Encounter (Signed)
Metformin Hydrochlorothiazide  [facility] on [address]  Pt has an appt on file

## 2021-09-12 NOTE — Telephone Encounter (Signed)
error 

## 2021-09-19 ENCOUNTER — Other Ambulatory Visit: Payer: Self-pay | Admitting: Nurse Practitioner

## 2021-09-19 ENCOUNTER — Other Ambulatory Visit: Payer: Self-pay

## 2021-09-19 ENCOUNTER — Other Ambulatory Visit: Payer: Self-pay | Admitting: Genetic Counselor

## 2021-09-19 DIAGNOSIS — Z1231 Encounter for screening mammogram for malignant neoplasm of breast: Secondary | ICD-10-CM

## 2021-09-19 DIAGNOSIS — I10 Essential (primary) hypertension: Secondary | ICD-10-CM

## 2021-09-19 MED ORDER — AMLODIPINE BESYLATE 10 MG PO TABS: 10.0000 mg | ORAL_TABLET | Freq: Every day | ORAL | 3 refills | Status: DC

## 2021-09-19 MED ORDER — LISINOPRIL 20 MG PO TABS: 20.0000 mg | ORAL_TABLET | Freq: Every day | ORAL | 3 refills | Status: DC

## 2021-09-22 ENCOUNTER — Other Ambulatory Visit: Payer: Self-pay

## 2021-09-22 MED ORDER — POLYETHYLENE GLYCOL 3350 17 G PO PACK: 17.0000 g | PACK | Freq: Every day | ORAL | 99 refills | Status: DC | PRN

## 2021-10-07 ENCOUNTER — Encounter: Payer: Self-pay | Admitting: Nurse Practitioner

## 2021-10-07 ENCOUNTER — Ambulatory Visit: Payer: Medicaid Other | Admitting: Nurse Practitioner

## 2021-10-07 ENCOUNTER — Other Ambulatory Visit: Payer: Self-pay

## 2021-10-07 VITALS — BP 139/73 | HR 64 | Temp 98.1°F | Ht 68.0 in | Wt 219.8 lb

## 2021-10-07 DIAGNOSIS — R0609 Other forms of dyspnea: Secondary | ICD-10-CM | POA: Diagnosis not present

## 2021-10-07 DIAGNOSIS — Z23 Encounter for immunization: Secondary | ICD-10-CM | POA: Diagnosis not present

## 2021-10-07 DIAGNOSIS — H9193 Unspecified hearing loss, bilateral: Secondary | ICD-10-CM | POA: Diagnosis not present

## 2021-10-07 DIAGNOSIS — E785 Hyperlipidemia, unspecified: Secondary | ICD-10-CM

## 2021-10-07 DIAGNOSIS — Z1329 Encounter for screening for other suspected endocrine disorder: Secondary | ICD-10-CM | POA: Diagnosis not present

## 2021-10-07 DIAGNOSIS — E118 Type 2 diabetes mellitus with unspecified complications: Secondary | ICD-10-CM | POA: Diagnosis not present

## 2021-10-07 DIAGNOSIS — I10 Essential (primary) hypertension: Secondary | ICD-10-CM

## 2021-10-07 MED ORDER — POLYETHYLENE GLYCOL 3350 17 G PO PACK: 17.0000 g | PACK | Freq: Every day | ORAL | 99 refills | Status: DC | PRN

## 2021-10-07 MED ORDER — SITAGLIPTIN PHOSPHATE 25 MG PO TABS: 25.0000 mg | ORAL_TABLET | Freq: Every day | ORAL | 3 refills | Status: DC

## 2021-10-07 MED ORDER — SIMVASTATIN 10 MG PO TABS: 10.0000 mg | ORAL_TABLET | Freq: Every evening | ORAL | 3 refills | Status: DC

## 2021-10-07 MED ORDER — ALBUTEROL SULFATE HFA 108 (90 BASE) MCG/ACT IN AERS: INHALATION_SPRAY | RESPIRATORY_TRACT | 11 refills | Status: DC

## 2021-10-07 NOTE — Progress Notes (Signed)
[facility] Patient Candace Diaz [address] [place], [place]  [address] Phone:  [phone]   Fax:  [phone]   Established Patient Office Visit  Subjective:  Patient ID: Candace Diaz, female    DOB: 1956/02/15  Age: 65 y.o. MRN: [id number]  CC:  Chief Complaint  Patient presents with   Hypertension    6 month follow up visit;Hypertension     HPI [name] presents for follow up. A former patient of NP Stroud. She  has a past medical history of Chronic pain of right knee, Diabetes mellitus, History of CVA (cerebrovascular accident), History of total knee arthroplasty, right (10/2019), Hypertension, and Unsteady gait (07/2020).   Diabetes Mellitus Patient presents for follow up of diabetes. Current symptoms include: none. Symptoms have stabilized. Patient denies foot ulcerations, hyperglycemia, hypoglycemia , increased appetite, nausea, paresthesia of the feet, polydipsia, polyuria, visual disturbances, vomiting, and weight loss. Evaluation to date has included: hemoglobin A1C. . Current treatment: Continued insulin which has been effective, Continued metformin which has been effective, Continued ACE inhibitor/ARB which has been somewhat effective, and Continued Sitagliptin which has been effective.   Past Medical History:  Diagnosis Date   Chronic pain of right knee    Diabetes mellitus    type 2    History of CVA (cerebrovascular accident)    History of total knee arthroplasty, right 10/2019   Hypertension    Unsteady gait 07/2020    Past Surgical History:  Procedure Laterality Date   BREAST BIOPSY Left unsure   CATARACT EXTRACTION Right    surgery on ears   10 or 65 yo   " they did surgery to make me hear better but it just made it worse"    TONSILLECTOMY  childhood   TOTAL KNEE ARTHROPLASTY Right 10/28/2019   Procedure: TOTAL KNEE ARTHROPLASTY;  Surgeon: Teryl Lucy, MD;  Location: [facility];  Service: Orthopedics;  Laterality: Right;    Family History   Problem Relation Age of Onset   Diabetes Brother    Hypertension Brother    Diabetes Other    Hypertension Other     Social History   Socioeconomic History   Marital status: Single    Spouse name: Not on file   Number of children: Not on file   Years of education: Not on file   Highest education level: Not on file  Occupational History   Not on file  Tobacco Use   Smoking status: Former    Types: Cigarettes    Quit date: 2000    Years since quitting: 22.8   Smokeless tobacco: Never  Vaping Use   Vaping Use: Never used  Substance and Sexual Activity   Alcohol use: No   Drug use: No   Sexual activity: Not Currently  Other Topics Concern   Not on file  Social History Narrative   Not on file   Social Determinants of Health   Financial Resource Strain: Not on file  Food Insecurity: Not on file  Transportation Needs: Not on file  Physical Activity: Not on file  Stress: Not on file  Social Connections: Not on file  Intimate Partner Violence: Not on file    Outpatient Medications Prior to Visit  Medication Sig Dispense Refill   acetaminophen (TYLENOL) 500 MG tablet Take 1,000 mg by mouth every 6 (six) hours as needed for moderate pain or headache.     amLODipine (NORVASC) 10 MG tablet Take 1 tablet (10 mg total) by mouth  daily. 90 tablet 3   B-D ULTRAFINE III SHORT PEN 31G X 8 MM MISC USE WITH INSULIN INJECTIONS ONCE DAILY 300 each 3   baclofen (LIORESAL) 10 MG tablet Take 1 tablet (10 mg total) by mouth 3 (three) times daily. As needed for muscle spasm 50 tablet 0   cholecalciferol (VITAMIN D3) 25 MCG (1000 UT) tablet Take 2,000 Units by mouth daily.     EASY TOUCH INSULIN SYRINGE 31G X 5/16" 0.5 ML MISC USE TO INJECT INSULIN EVERY DAY 100 each 1   glucose blood (ACCU-CHEK GUIDE) test strip Use as instructed 102 each 12   hydrochlorothiazide (HYDRODIURIL) 25 MG tablet Take 1 tablet (25 mg total) by mouth daily. 90 tablet 3   insulin glargine (LANTUS SOLOSTAR) 100  UNIT/ML Solostar Pen INJECT 10 UNITS INTO THE SKIN DAILY AT 10 PM. 15 mL 44   Insulin Pen Needle (NOVOFINE) 30G X 8 MM MISC Administer insulin subcutaneously once daily. ICD 10 E.11 100 each 5   Lancets (ACCU-CHEK MULTICLIX) lancets USE TO CHECK BLOOD SUGAR TWICE DAILY 102 each 2   Lancets (ACCU-CHEK MULTICLIX) lancets USE TO CHECK BLOOD SUGAR TWICE DAILY 102 each 0   lisinopril (ZESTRIL) 20 MG tablet Take 1 tablet (20 mg total) by mouth daily. 90 tablet 3   metFORMIN (GLUCOPHAGE) 1000 MG tablet Take 1 tablet by mouth 2 times a day. 180 tablet 3   Multiple Vitamins-Minerals (MULTIVITAMIN WITH MINERALS) tablet Take 2 tablets by mouth daily.      ondansetron (ZOFRAN) 4 MG tablet TAKE 1 TABLET BY MOUTH EVERY 8 HOURS AS NEEDED FOR NAUSEA FOR VOMITING 20 tablet 2   Polyvinyl Alcohol-Povidone (REFRESH OP) Place 1 drop into both eyes daily as needed (dry eyes).     sennosides-docusate sodium (SENOKOT-S) 8.6-50 MG tablet Take 2 tablets by mouth daily. 30 tablet 1   albuterol (PROAIR HFA) 108 (90 Base) MCG/ACT inhaler INHALE 2 PUFFS BY MOUTH FOUR TIMES A DAY AS NEEDED FOR SHORTNESS OF BREATH. 8.5 each 11   polyethylene glycol (MIRALAX / GLYCOLAX) 17 g packet Take 17 g by mouth daily as needed for mild constipation. 14 each prn   simvastatin (ZOCOR) 10 MG tablet Take 1 tablet (10 mg total) by mouth every evening. 90 tablet 3   sitaGLIPtin (JANUVIA) 25 MG tablet Take 1 tablet (25 mg total) by mouth daily. 90 tablet 3   oxyCODONE (ROXICODONE) 5 MG immediate release tablet Take 1 tablet (5 mg total) by mouth every 4 (four) hours as needed for severe pain. (Patient not taking: Reported on 10/07/2021) 30 tablet 0   No facility-administered medications prior to visit.    Allergies  Allergen Reactions   Aspirin Other (See Comments)    Internal bleeding   Penicillins Hives    Did it involve swelling of the face/tongue/throat, SOB, or low BP? Unknown Did it involve sudden or severe rash/hives, skin peeling, or  any reaction on the inside of your mouth or nose? Unknown Did you need to seek medical attention at a hospital or doctor's office? Unknown When did it last happen?      years If all above answers are "NO", may proceed with cephalosporin use.    ROS Review of Systems  Respiratory:  Positive for shortness of breath and wheezing.      Objective:    Physical Exam Constitutional:      Appearance: She is obese.  HENT:     Head: Normocephalic and atraumatic.  Cardiovascular:  Rate and Rhythm: Normal rate and regular rhythm.     Pulses: Normal pulses.     Heart sounds: Normal heart sounds.  Pulmonary:     Effort: Pulmonary effort is normal.     Breath sounds: Normal breath sounds.     Comments: Increased abdominal girth   Abdominal:     General: Bowel sounds are normal.  Musculoskeletal:        General: Normal range of motion.     Cervical back: Normal range of motion.  Skin:    General: Skin is warm and dry.     Capillary Refill: Capillary refill takes less than 2 seconds.  Neurological:     General: No focal deficit present.     Mental Status: She is alert and oriented to person, place, and time.  Psychiatric:        Mood and Affect: Mood normal.        Behavior: Behavior normal.        Thought Content: Thought content normal.        Judgment: Judgment normal.    BP 139/73   Pulse 64   Temp 98.1 F (36.7 C)   Ht 5\' 8"  (1.727 m)   Wt 219 lb 12.8 oz (99.7 kg)   SpO2 100%   BMI 33.42 kg/m  Wt Readings from Last 3 Encounters:  10/07/21 219 lb 12.8 oz (99.7 kg)  04/01/21 224 lb (101.6 kg)  01/26/21 224 lb 6.4 oz (101.8 kg)     Health Maintenance Due  Topic Date Due   INFLUENZA VACCINE  07/25/2021   HEMOGLOBIN A1C  10/01/2021    There are no preventive care reminders to display for this patient.  Lab Results  Component Value Date   TSH 1.620 01/26/2021   Lab Results  Component Value Date   WBC 5.7 01/26/2021   HGB 11.8 01/26/2021   HCT 36.0  01/26/2021   MCV 86 01/26/2021   PLT 348 01/26/2021   Lab Results  Component Value Date   NA 141 01/26/2021   K 3.5 01/26/2021   CO2 26 01/26/2021   GLUCOSE 96 01/26/2021   BUN 9 01/26/2021   CREATININE 0.77 01/26/2021   BILITOT 0.6 01/26/2021   ALKPHOS 58 01/26/2021   AST 38 01/26/2021   ALT 41 (H) 01/26/2021   PROT 7.6 01/26/2021   ALBUMIN 4.4 01/26/2021   CALCIUM 9.4 01/26/2021   ANIONGAP 11 10/29/2019   Lab Results  Component Value Date   CHOL 140 01/26/2021   Lab Results  Component Value Date   HDL 57 01/26/2021   Lab Results  Component Value Date   LDLCALC 68 01/26/2021   Lab Results  Component Value Date   TRIG 75 01/26/2021   Lab Results  Component Value Date   CHOLHDL 2.5 01/26/2021   Lab Results  Component Value Date   HGBA1C 6.1 (A) 04/01/2021   HGBA1C 6.1 04/01/2021   HGBA1C 6.1 04/01/2021   HGBA1C 6.1 04/01/2021      Assessment & Plan:   Problem List Items Addressed This Visit       Cardiovascular and Mediastinum   HTN (hypertension), malignant (Chronic) Stable Encouraged on going compliance with current medication regimen Encouraged home monitoring and recording BP <130/80 Eating a heart-healthy diet with less salt Encouraged regular physical activity  Recommend Weight loss     Relevant Medications   simvastatin (ZOCOR) 10 MG tablet     Other   DOE (dyspnea on exertion)   Relevant  Medications   albuterol (PROAIR HFA) 108 (90 Base) MCG/ACT inhaler   Other Visit Diagnoses     Type 2 diabetes mellitus with complication, without long-term current use of insulin (HCC)    -  Primary Controlled Encourage compliance with current treatment regimen   Encourage regular CBG monitoring Encourage contacting office if excessive hyperglycemia and or hypoglycemia Lifestyle modification with healthy diet (fewer calories, more high fiber foods, whole grains and non-starchy vegetables, lower fat meat and fish, low-fat diary include healthy  oils) regular exercise (physical activity) and weight loss Opthalmology exam discussed  Nutritional consult recommended Regular dental visits encouraged Home BP monitoring also encouraged goal <130/80    Relevant Medications   sitaGLIPtin (JANUVIA) 25 MG tablet   simvastatin (ZOCOR) 10 MG tablet   Other Relevant Orders   HgB A1c   Comp. Metabolic Panel (12) (Completed)   Hyperlipidemia, unspecified hyperlipidemia type     Continue to encourage heart healthy diet   Relevant Medications   simvastatin (ZOCOR) 10 MG tablet   Other Relevant Orders   Comp. Metabolic Panel (12) (Completed)   Lipid panel (Completed)   Bilateral hearing loss, unspecified hearing loss type     Worsening Head to remove mass to accommodate patient ability to read lips   Flu vaccine need       Screening for thyroid disorder       Relevant Orders   TSH (Completed)       Meds ordered this encounter  Medications   albuterol (PROAIR HFA) 108 (90 Base) MCG/ACT inhaler    Sig: INHALE 2 PUFFS BY MOUTH FOUR TIMES A DAY AS NEEDED FOR SHORTNESS OF BREATH.    Dispense:  8.5 each    Refill:  11   polyethylene glycol (MIRALAX / GLYCOLAX) 17 g packet    Sig: Take 17 g by mouth daily as needed for mild constipation.    Dispense:  14 each    Refill:  prn   sitaGLIPtin (JANUVIA) 25 MG tablet    Sig: Take 1 tablet (25 mg total) by mouth daily.    Dispense:  90 tablet    Refill:  3    Order Specific Question:   Supervising Provider    Answer:   Quentin Angst [[id number]]   simvastatin (ZOCOR) 10 MG tablet    Sig: Take 1 tablet (10 mg total) by mouth every evening.    Dispense:  90 tablet    Refill:  3    Order Specific Question:   Supervising Provider    Answer:   Quentin Angst [id number]     Follow-up: Return in about 6 months (around 04/07/2022) for follow up [phone] [id number].    Barbette Merino, NP

## 2021-10-08 LAB — COMP. METABOLIC PANEL (12)
AST: 36 IU/L (ref 0–40)
Albumin/Globulin Ratio: 1.7 (ref 1.2–2.2)
Albumin: 4.7 g/dL (ref 3.8–4.8)
Alkaline Phosphatase: 61 IU/L (ref 44–121)
BUN/Creatinine Ratio: 18 (ref 12–28)
BUN: 16 mg/dL (ref 8–27)
Bilirubin Total: 0.6 mg/dL (ref 0.0–1.2)
Calcium: 9.2 mg/dL (ref 8.7–10.3)
Chloride: 102 mmol/L (ref 96–106)
Creatinine, Ser: 0.87 mg/dL (ref 0.57–1.00)
Globulin, Total: 2.7 g/dL (ref 1.5–4.5)
Glucose: 91 mg/dL (ref 70–99)
Potassium: 3.2 mmol/L — ABNORMAL LOW (ref 3.5–5.2)
Sodium: 141 mmol/L (ref 134–144)
Total Protein: 7.4 g/dL (ref 6.0–8.5)
eGFR: 74 mL/min/{1.73_m2} (ref >59)

## 2021-10-08 LAB — LIPID PANEL
Chol/HDL Ratio: 2.7 ratio (ref 0.0–4.4)
Cholesterol, Total: 145 mg/dL (ref 100–199)
HDL: 53 mg/dL (ref >39)
LDL Chol Calc (NIH): 78 mg/dL (ref 0–99)
Triglycerides: 68 mg/dL (ref 0–149)
VLDL Cholesterol Cal: 14 mg/dL (ref 5–40)

## 2021-10-08 LAB — TSH: TSH: 1.26 u[IU]/mL (ref 0.450–4.500)

## 2021-10-21 ENCOUNTER — Other Ambulatory Visit: Payer: Self-pay

## 2021-10-21 ENCOUNTER — Ambulatory Visit
Admission: RE | Admit: 2021-10-21 | Discharge: 2021-10-21 | Disposition: A | Discharge status: Home / Self Care | Payer: Medicaid Other | Source: Ambulatory Visit | Attending: Nurse Practitioner | Admitting: Nurse Practitioner

## 2021-10-21 DIAGNOSIS — Z1231 Encounter for screening mammogram for malignant neoplasm of breast: Secondary | ICD-10-CM

## 2021-12-13 ENCOUNTER — Other Ambulatory Visit: Payer: Self-pay

## 2021-12-14 ENCOUNTER — Other Ambulatory Visit: Payer: Self-pay

## 2021-12-14 DIAGNOSIS — E118 Type 2 diabetes mellitus with unspecified complications: Secondary | ICD-10-CM

## 2021-12-14 MED ORDER — BD PEN NEEDLE SHORT U/F 31G X 8 MM MISC: 3 refills | Status: DC

## 2022-02-20 ENCOUNTER — Telehealth: Payer: Self-pay | Admitting: Nurse Practitioner

## 2022-02-24 ENCOUNTER — Telehealth: Payer: Self-pay

## 2022-02-24 NOTE — Telephone Encounter (Signed)
Purple one  ?Lancets ? ? ?Said something about medicaid will not pay ? ?

## 2022-04-07 ENCOUNTER — Encounter: Payer: Self-pay | Admitting: Nurse Practitioner

## 2022-04-07 ENCOUNTER — Ambulatory Visit: Payer: Medicaid Other | Admitting: Nurse Practitioner

## 2022-04-07 VITALS — BP 138/84 | HR 69 | Temp 98.1°F | Ht 68.0 in | Wt 220.4 lb

## 2022-04-07 DIAGNOSIS — K5909 Other constipation: Secondary | ICD-10-CM | POA: Diagnosis not present

## 2022-04-07 DIAGNOSIS — E118 Type 2 diabetes mellitus with unspecified complications: Secondary | ICD-10-CM

## 2022-04-07 DIAGNOSIS — H04123 Dry eye syndrome of bilateral lacrimal glands: Secondary | ICD-10-CM | POA: Diagnosis not present

## 2022-04-07 DIAGNOSIS — R0609 Other forms of dyspnea: Secondary | ICD-10-CM | POA: Diagnosis not present

## 2022-04-07 LAB — POCT GLYCOSYLATED HEMOGLOBIN (HGB A1C)
HbA1c POC (<> result, manual entry): 6 % (ref 4.0–5.6)
HbA1c, POC (controlled diabetic range): 6 % (ref 0.0–7.0)
HbA1c, POC (prediabetic range): 6 % (ref 5.7–6.4)
Hemoglobin A1C: 6 % — AB (ref 4.0–5.6)

## 2022-04-07 MED ORDER — ALBUTEROL SULFATE HFA 108 (90 BASE) MCG/ACT IN AERS: INHALATION_SPRAY | RESPIRATORY_TRACT | 11 refills | Status: DC

## 2022-04-07 MED ORDER — REFRESH 1.4-0.6 % OP SOLN: 2.0000 [drp] | Freq: Every day | OPHTHALMIC | 5 refills | Status: AC | PRN

## 2022-04-07 MED ORDER — POLYETHYLENE GLYCOL 3350 17 G PO PACK: 17.0000 g | PACK | Freq: Every day | ORAL | 99 refills | Status: DC | PRN

## 2022-04-07 NOTE — Progress Notes (Signed)
? ?[place] ?[place]  [address] ?Phone:  [phone]   Fax:  [phone] ?Subjective:  ? Patient ID: Candace Diaz, female    DOB: Oct 21, 1956, 66 y.o.   MRN: [id number] ? ?Chief Complaint  ?Patient presents with  ? Follow-up  ?  Patient is here today for her 6 month follow up visit with no concerns or issues to discuss.  ? ?HPI ?Candace Diaz 66 y.o. 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 To the [facility] for reevaluation of chronic illness.  ? ?To the [facility] for wellness visit. Denies any concerns today. Currently compliant with all medications. Denies adhering to any diet and/ exercise regimen. Please restart monitoring diet and walking more frequently.  ? ?Diabetes Mellitus: Patient presents for follow up of diabetes. Symptoms: none. . Patient denies foot ulcerations, hypoglycemia , increase appetite, and nausea.  Evaluation to date has been included: hemoglobin A1C.  Home sugars: BGs range between 100 and 110 . Treatment to date: no recent interventions.  ? ?Denies any other concerns today. Denies any fatigue, chest pain, shortness of breath, HA or dizziness. Denies any blurred vision, numbness or tingling. ?Past Medical History:  ?Diagnosis Date  ? Chronic pain of right knee   ? Diabetes mellitus   ? type 2   ? History of CVA (cerebrovascular accident)   ? History of total knee arthroplasty, right 10/2019  ? Hypertension   ? Unsteady gait 07/2020  ? ? ?Past Surgical History:  ?Procedure Laterality Date  ? BREAST BIOPSY Left unsure  ? CATARACT EXTRACTION Right   ? surgery on ears   45 or 66 yo  ? " they did surgery to make me hear better but it just made it worse"   ? TONSILLECTOMY  childhood  ? TOTAL KNEE ARTHROPLASTY Right 10/28/2019  ? Procedure: TOTAL KNEE ARTHROPLASTY;  Surgeon:  Marchia Bond, MD;  Location: [facility];  Service: Orthopedics;  Laterality: Right;  ? ? ?Family History  ?Problem Relation Age of Onset  ? Diabetes Brother   ? Hypertension Brother   ? Diabetes Other   ? Hypertension Other   ? ? ?Social History  ? ?Socioeconomic History  ? Marital status: Single  ?  Spouse name: Not on file  ? Number of children: Not on file  ? Years of education: Not on file  ? Highest education level: Not on file  ?Occupational History  ? Not on file  ?Tobacco Use  ? Smoking status: Former  ?  Types: Cigarettes  ?  Quit date: 2000  ?  Years since quitting: 23.2  ? Smokeless tobacco: Never  ?Vaping Use  ? Vaping Use: Never used  ?Substance and Sexual Activity  ? Alcohol use: No  ? Drug use: No  ? Sexual activity: Not Currently  ?Other Topics Concern  ? Not on file  ?Social History Narrative  ? Not on file  ? ?Social Determinants of Health  ? ?Financial Resource Strain: Not on file  ?Food Insecurity: Not on file  ?Transportation Needs: Not on file  ?Physical Activity: Not on file  ?Stress: Not on file  ?Social Connections: Not on file  ?Intimate Partner Violence: Not on file  ? ? ?Outpatient Medications Prior to Visit  ?Medication Sig Dispense Refill  ? acetaminophen (TYLENOL) 500 MG tablet Take 1,000  mg by mouth every 6 (six) hours as needed for moderate pain or headache.    ? amLODipine (NORVASC) 10 MG tablet Take 1 tablet (10 mg total) by mouth daily. 90 tablet 3  ? baclofen (LIORESAL) 10 MG tablet Take 1 tablet (10 mg total) by mouth 3 (three) times daily. As needed for muscle spasm 50 tablet 0  ? cholecalciferol (VITAMIN D3) 25 MCG (1000 UT) tablet Take 2,000 Units by mouth daily.    ? EASY TOUCH INSULIN SYRINGE 31G X 5/16" 0.5 ML MISC USE TO INJECT INSULIN EVERY DAY 100 each 1  ? glucose blood (ACCU-CHEK GUIDE) test strip Use as instructed 102 each 12  ? hydrochlorothiazide (HYDRODIURIL) 25 MG tablet Take 1 tablet (25 mg total) by mouth daily. 90 tablet 3  ? insulin glargine (LANTUS SOLOSTAR)  100 UNIT/ML Solostar Pen INJECT 10 UNITS INTO THE SKIN DAILY AT 10 PM. 15 mL 44  ? Insulin Pen Needle (B-D ULTRAFINE III SHORT PEN) 31G X 8 MM MISC USE WITH INSULIN INJECTIONS ONCE DAILY 300 each 3  ? Insulin Pen Needle (NOVOFINE) 30G X 8 MM MISC Administer insulin subcutaneously once daily. ICD 10 E.11 100 each 5  ? Lancets (ACCU-CHEK MULTICLIX) lancets USE TO CHECK BLOOD SUGAR TWICE DAILY 102 each 2  ? Lancets (ACCU-CHEK MULTICLIX) lancets USE TO CHECK BLOOD SUGAR TWICE DAILY 102 each 0  ? lisinopril (ZESTRIL) 20 MG tablet Take 1 tablet (20 mg total) by mouth daily. 90 tablet 3  ? Multiple Vitamins-Minerals (MULTIVITAMIN WITH MINERALS) tablet Take 2 tablets by mouth daily.     ? ondansetron (ZOFRAN) 4 MG tablet TAKE 1 TABLET BY MOUTH EVERY 8 HOURS AS NEEDED FOR NAUSEA FOR VOMITING 20 tablet 2  ? sennosides-docusate sodium (SENOKOT-S) 8.6-50 MG tablet Take 2 tablets by mouth daily. 30 tablet 1  ? simvastatin (ZOCOR) 10 MG tablet Take 1 tablet (10 mg total) by mouth every evening. 90 tablet 3  ? sitaGLIPtin (JANUVIA) 25 MG tablet Take 1 tablet (25 mg total) by mouth daily. 90 tablet 3  ? albuterol (PROAIR HFA) 108 (90 Base) MCG/ACT inhaler INHALE 2 PUFFS BY MOUTH FOUR TIMES A DAY AS NEEDED FOR SHORTNESS OF BREATH. 8.5 each 11  ? metFORMIN (GLUCOPHAGE) 1000 MG tablet Take 1 tablet by mouth 2 times a day. 180 tablet 3  ? polyethylene glycol (MIRALAX / GLYCOLAX) 17 g packet Take 17 g by mouth daily as needed for mild constipation. 14 each prn  ? Polyvinyl Alcohol-Povidone (REFRESH OP) Place 1 drop into both eyes daily as needed (dry eyes).    ? oxyCODONE (ROXICODONE) 5 MG immediate release tablet Take 1 tablet (5 mg total) by mouth every 4 (four) hours as needed for severe pain. (Patient not taking: Reported on 10/07/2021) 30 tablet 0  ? ?No facility-administered medications prior to visit.  ? ? ?Allergies  ?Allergen Reactions  ? Aspirin Other (See Comments)  ?  Internal bleeding  ? Penicillins Hives  ?  Did it involve  swelling of the face/tongue/throat, SOB, or low BP? Unknown ?Did it involve sudden or severe rash/hives, skin peeling, or any reaction on the inside of your mouth or nose? Unknown ?Did you need to seek medical attention at a hospital or doctor's office? Unknown ?When did it last happen?      years ?If all above answers are ?NO?, may proceed with cephalosporin use.  ? ? ?Review of Systems  ?Constitutional: Negative.  Negative for chills, fever and malaise/fatigue.  ?HENT: Negative.    ?  Eyes: Negative.   ?Respiratory:  Negative for cough and shortness of breath.   ?Cardiovascular:  Negative for chest pain, palpitations and leg swelling.  ?Gastrointestinal:  Negative for abdominal pain, blood in stool, constipation, diarrhea, nausea and vomiting.  ?Genitourinary: Negative.   ?Musculoskeletal: Negative.   ?Skin: Negative.   ?Neurological: Negative.   ?Psychiatric/Behavioral:  Negative for depression. The patient is not nervous/anxious.   ?All other systems reviewed and are negative. ? ?   ?Objective:  ?  ?Physical Exam ?Vitals reviewed.  ?Constitutional:   ?   General: She is not in acute distress. ?   Appearance: Normal appearance. She is obese.  ?HENT:  ?   Head: Normocephalic.  ?   Right Ear: Tympanic membrane, ear canal and external ear normal. There is no impacted cerumen.  ?   Left Ear: Tympanic membrane, ear canal and external ear normal. There is no impacted cerumen.  ?   Nose: Nose normal. No congestion or rhinorrhea.  ?   Mouth/Throat:  ?   Mouth: Mucous membranes are moist.  ?   Pharynx: Oropharynx is clear. No oropharyngeal exudate or posterior oropharyngeal erythema.  ?Eyes:  ?   General: No scleral icterus.    ?   Right eye: No discharge.     ?   Left eye: No discharge.  ?   Extraocular Movements: Extraocular movements intact.  ?   Conjunctiva/sclera: Conjunctivae normal.  ?   Pupils: Pupils are equal, round, and reactive to light.  ?Neck:  ?   Vascular: No carotid bruit.  ?Cardiovascular:  ?   Rate and  Rhythm: Normal rate and regular rhythm.  ?   Pulses: Normal pulses.  ?   Heart sounds: Normal heart sounds.  ?   Comments: No obvious peripheral edema ?Pulmonary:  ?   Effort: Pulmonary effort is normal.

## 2022-04-07 NOTE — Patient Instructions (Signed)
You were seen today in the [facility] for wellness visit. Labs were collected, results will be available via MyChart or, if abnormal, you will be contacted by clinic staff. You were prescribed medications, please take as directed. Please follow up in 3 mths for reevaluation of DM. ?

## 2022-04-26 ENCOUNTER — Telehealth: Payer: Self-pay

## 2022-04-26 NOTE — Telephone Encounter (Signed)
PA Case: [id number], Status: Approved, Coverage Starts on: 04/26/2022 12:00:00 AM, Coverage Ends on: 04/26/2023 12:00:00 AM. ?

## 2022-04-26 NOTE — Telephone Encounter (Signed)
albuterol (PROAIR HFA) 108 (90 Base) MCG/ACT inhaler ? ?PA started on 04/26/22 ?

## 2022-05-22 IMAGING — MG DIGITAL SCREENING BILAT W/ TOMO W/ CAD
8 of 14 series · 8 of 40 positions shown · non-contrast
Comparison: Previous exam(s).

CLINICAL DATA: Screening.

EXAM:
DIGITAL SCREENING BILATERAL MAMMOGRAM WITH TOMO AND CAD

[R MLO synth-2D (1 of 2)]
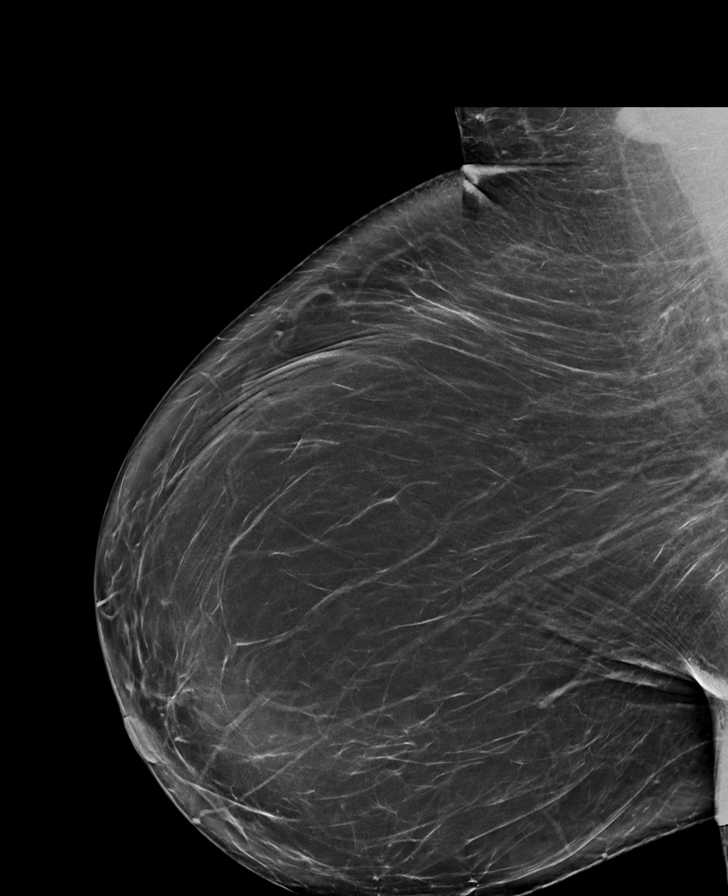

[L MLO synth-2D (1 of 2)]
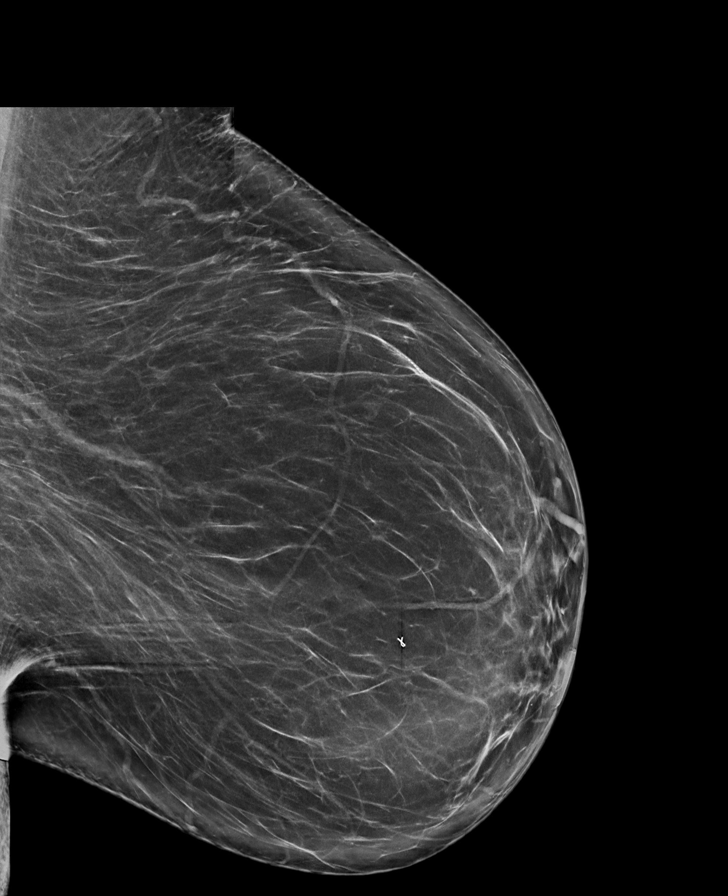

[R MLO synth-2D (2 of 2)]
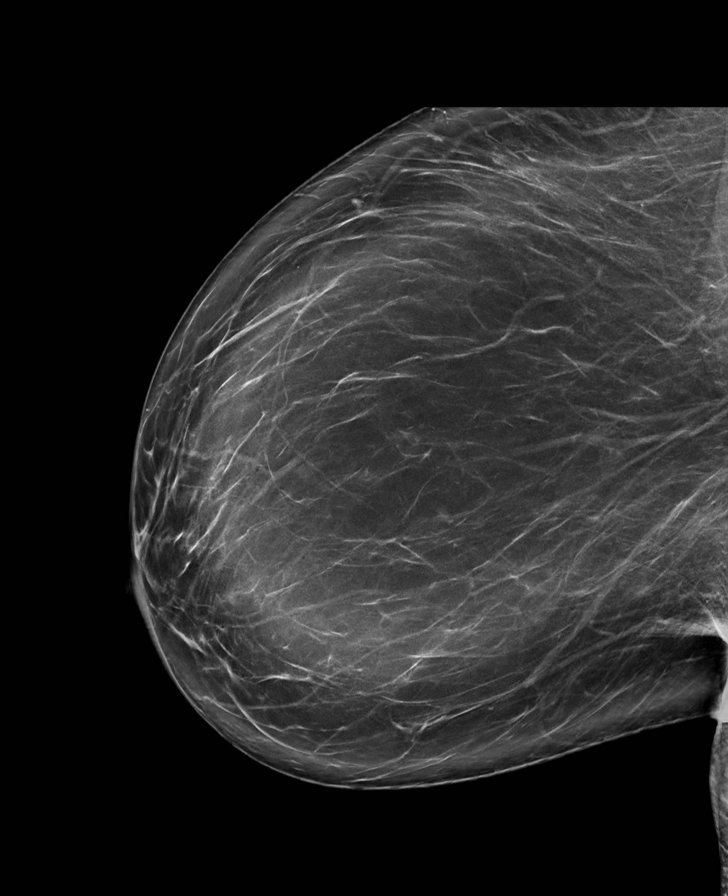

[R CC synth-2D (1 of 2)]
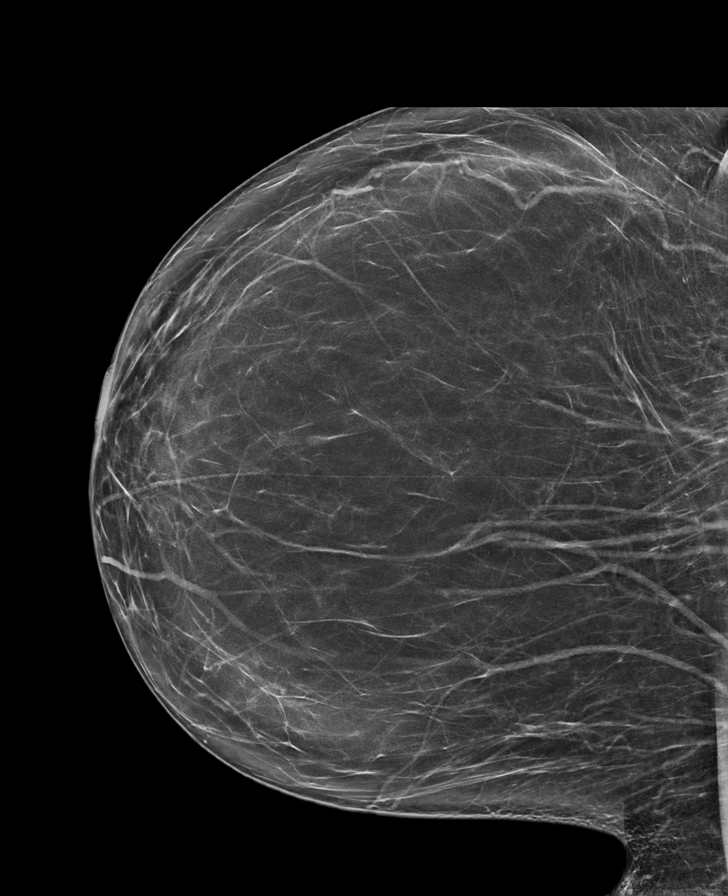

[L MLO synth-2D (2 of 2)]
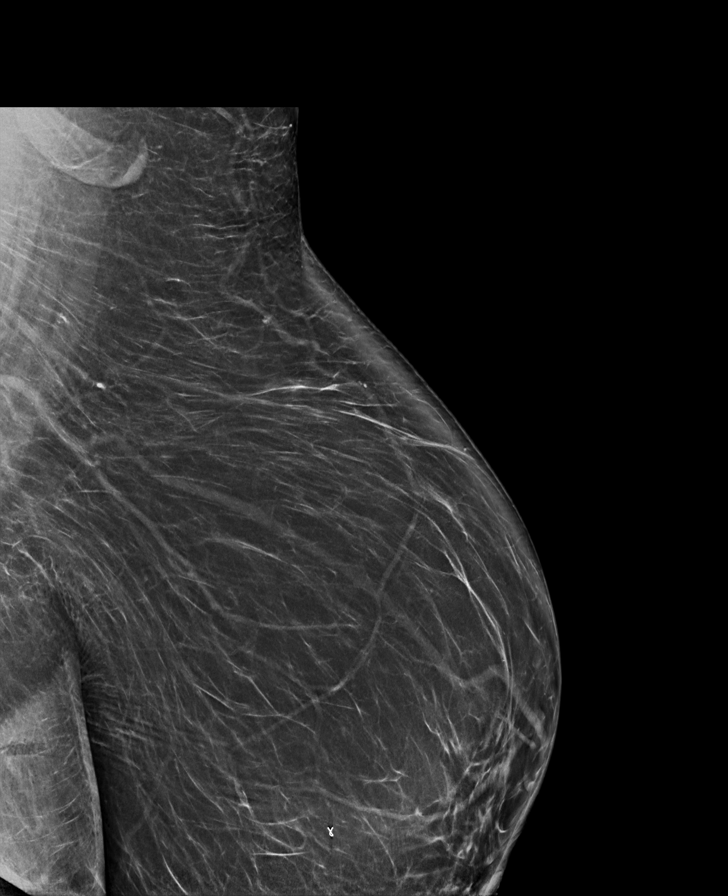

[L CC synth-2D]
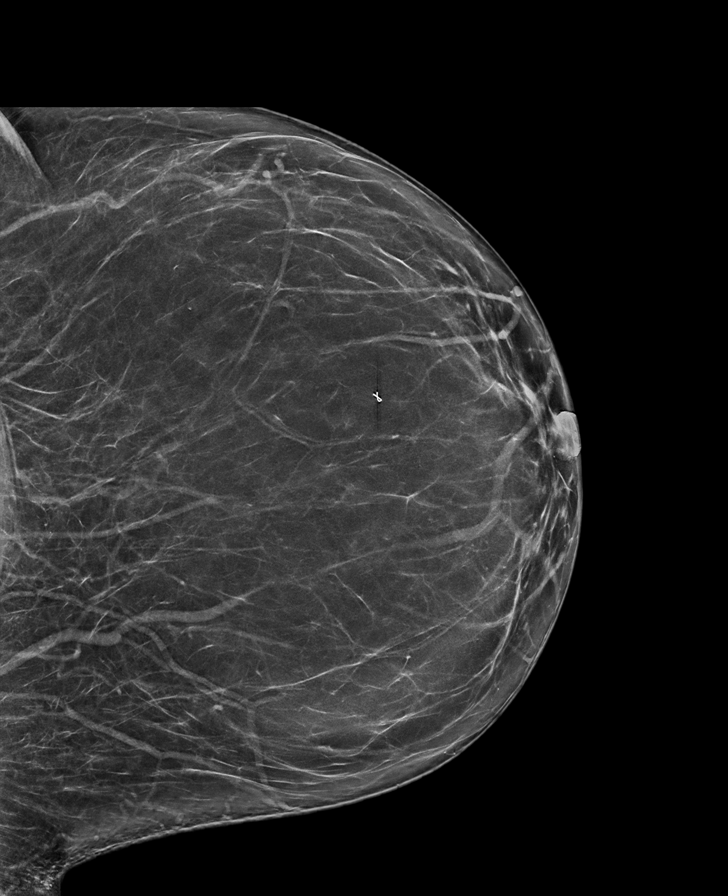

[R CC synth-2D (2 of 2)]
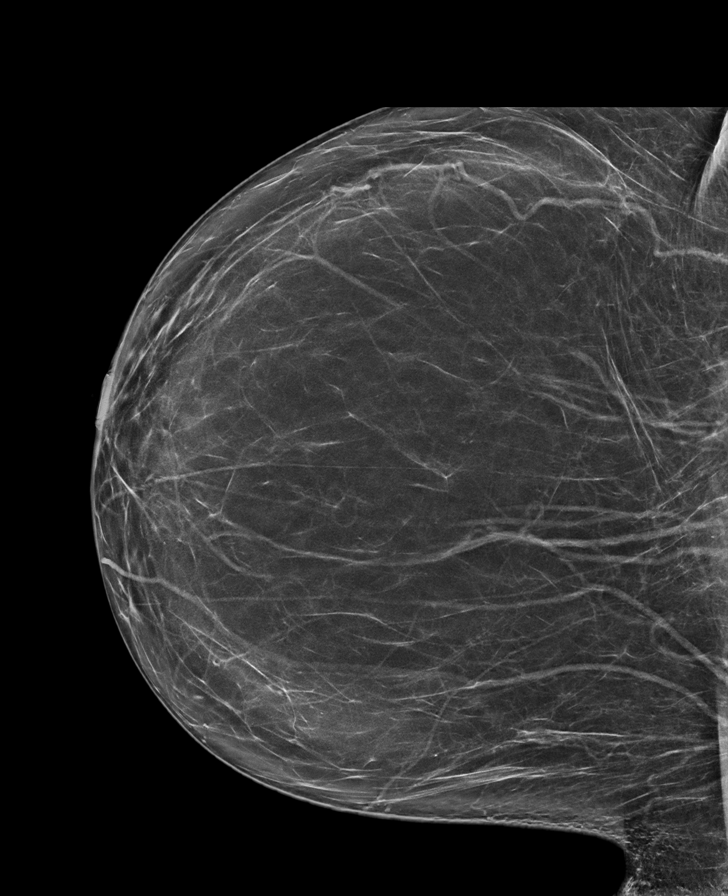

[L CC tomo · tomo slice 39/78.0]
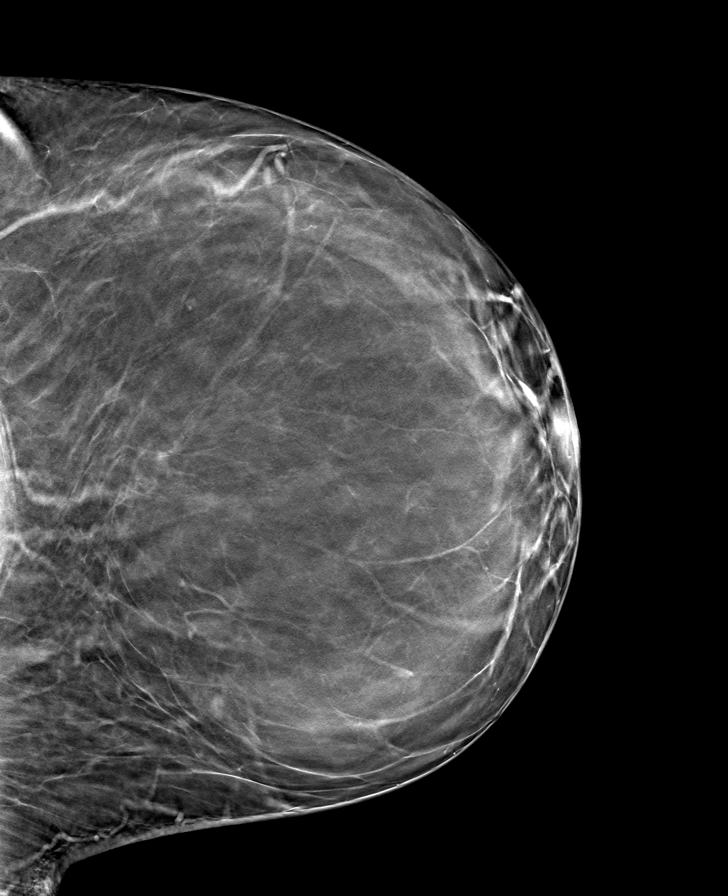

[8 of 40 positions shown; findings below may reference images not displayed]

ACR Breast Density Category b: There are scattered areas of
fibroglandular density.
FINDINGS: There are no findings suspicious for malignancy. Images were
processed with CAD.
IMPRESSION: No mammographic evidence of malignancy. A result letter of this
screening mammogram will be mailed directly to the patient.

RECOMMENDATION:
Screening mammogram in one year. (Code:[id number])

BI-RADS CATEGORY  1: Negative.

## 2022-06-11 ENCOUNTER — Other Ambulatory Visit: Payer: Self-pay | Admitting: Nurse Practitioner

## 2022-06-11 DIAGNOSIS — E118 Type 2 diabetes mellitus with unspecified complications: Secondary | ICD-10-CM

## 2022-07-03 NOTE — Telephone Encounter (Signed)
error 

## 2022-07-07 ENCOUNTER — Ambulatory Visit: Payer: Medicaid Other | Admitting: Nurse Practitioner

## 2022-07-07 ENCOUNTER — Other Ambulatory Visit: Payer: Self-pay | Admitting: Nurse Practitioner

## 2022-07-07 DIAGNOSIS — E118 Type 2 diabetes mellitus with unspecified complications: Secondary | ICD-10-CM

## 2022-07-13 ENCOUNTER — Encounter: Payer: Self-pay | Admitting: Nurse Practitioner

## 2022-07-13 ENCOUNTER — Ambulatory Visit (INDEPENDENT_AMBULATORY_CARE_PROVIDER_SITE_OTHER): Payer: Medicare Other | Admitting: Nurse Practitioner

## 2022-07-13 VITALS — BP 133/78 | HR 74 | Temp 98.0°F | Ht 68.0 in | Wt 218.0 lb

## 2022-07-13 DIAGNOSIS — E785 Hyperlipidemia, unspecified: Secondary | ICD-10-CM | POA: Diagnosis not present

## 2022-07-13 DIAGNOSIS — I10 Essential (primary) hypertension: Secondary | ICD-10-CM

## 2022-07-13 DIAGNOSIS — E118 Type 2 diabetes mellitus with unspecified complications: Secondary | ICD-10-CM

## 2022-07-13 LAB — POCT GLYCOSYLATED HEMOGLOBIN (HGB A1C)
HbA1c POC (<> result, manual entry): 7 % (ref 4.0–5.6)
HbA1c, POC (controlled diabetic range): 7 % (ref 0.0–7.0)
HbA1c, POC (prediabetic range): 7 % — AB (ref 5.7–6.4)
Hemoglobin A1C: 7 % — AB (ref 4.0–5.6)

## 2022-07-13 MED ORDER — LISINOPRIL 20 MG PO TABS: 20.0000 mg | ORAL_TABLET | Freq: Every day | ORAL | 3 refills | Status: DC

## 2022-07-13 MED ORDER — AMLODIPINE BESYLATE 10 MG PO TABS: 10.0000 mg | ORAL_TABLET | Freq: Every day | ORAL | 3 refills | Status: DC

## 2022-07-13 MED ORDER — SITAGLIPTIN PHOSPHATE 25 MG PO TABS: 25.0000 mg | ORAL_TABLET | Freq: Every day | ORAL | 3 refills | Status: DC

## 2022-07-13 MED ORDER — SIMVASTATIN 10 MG PO TABS: 10.0000 mg | ORAL_TABLET | Freq: Every evening | ORAL | 3 refills | Status: DC

## 2022-07-13 NOTE — Progress Notes (Signed)
@Patient  ID: , female    DOB: August 22, 1956, 66 y.o.   MRN: [id number]  Chief Complaint  Patient presents with   Follow-up    Pt is here for 3 month's DM follow up visit. Pt is requesting refill on all medications    Referring provider: [id number], NP   HPI  Candace Diaz 66 y.o. 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 To the [facility] for reevaluation of chronic illness.   Diabetes Mellitus: Patient presents for follow up of diabetes. Symptoms: none. . Patient denies foot ulcerations, hypoglycemia , increase appetite, and nausea.  Evaluation to date has been included: hemoglobin A1C.  Home sugars: BGs range between 100 and 110 . Treatment to date: no recent interventions.    Denies f/c/s, n/v/d, hemoptysis, PND, leg swelling Denies chest pain or edema   Allergies  Allergen Reactions   Aspirin Other (See Comments)    Internal bleeding   Penicillins Hives    Did it involve swelling of the face/tongue/throat, SOB, or low BP? Unknown Did it involve sudden or severe rash/hives, skin peeling, or any reaction on the inside of your mouth or nose? Unknown Did you need to seek medical attention at a hospital or doctor's office? Unknown When did it last happen?      years If all above answers are "NO", may proceed with cephalosporin use.    Immunization History  Administered Date(s) Administered   Influenza,inj,Quad PF,6+ Mos 12/03/2014, 09/09/2015, 10/30/2016, 08/31/2017, 09/09/2018, 10/07/2021   Pneumococcal Conjugate-13 01/24/2016   Pneumococcal Polysaccharide-23 08/14/2014   Tdap 08/14/2014    Past Medical History:  Diagnosis Date   Chronic pain of right knee    Diabetes mellitus    type 2    History of CVA (cerebrovascular accident)    History of total knee arthroplasty, right  10/2019   Hypertension    Unsteady gait 07/2020    Tobacco History: Social History   Tobacco Use  Smoking Status Former   Types: Cigarettes   Quit date: 2000   Years since quitting: 23.5  Smokeless Tobacco Never   Counseling given: Not Answered   Outpatient Encounter Medications as of 07/13/2022  Medication Sig   acetaminophen (TYLENOL) 500 MG tablet Take 1,000 mg by mouth every 6 (six) hours as needed for moderate pain or headache.   albuterol (PROAIR HFA) 108 (90 Base) MCG/ACT inhaler INHALE 2 PUFFS BY MOUTH FOUR TIMES A DAY AS NEEDED FOR SHORTNESS OF BREATH.   baclofen (LIORESAL) 10 MG tablet Take 1 tablet (10 mg total) by mouth 3 (three) times daily. As needed for muscle spasm   cholecalciferol (VITAMIN D3) 25 MCG (1000 UT) tablet Take 2,000 Units by mouth daily.   EASY TOUCH INSULIN SYRINGE 31G X 5/16" 0.5 ML MISC USE TO INJECT INSULIN EVERY DAY   glucose blood (ACCU-CHEK GUIDE) test strip Use as instructed   hydrochlorothiazide (HYDRODIURIL) 25 MG tablet Take 1 tablet (25 mg total) by mouth daily.   Insulin Pen Needle (B-D ULTRAFINE III SHORT PEN) 31G X 8 MM MISC USE WITH INSULIN INJECTIONS ONCE DAILY   Lancets (ACCU-CHEK MULTICLIX) lancets USE TO CHECK BLOOD SUGAR TWICE DAILY   Multiple Vitamins-Minerals (MULTIVITAMIN WITH MINERALS) tablet Take 2 tablets by mouth daily.    ondansetron (ZOFRAN) 4 MG tablet TAKE 1 TABLET BY MOUTH EVERY 8 HOURS AS NEEDED  FOR NAUSEA FOR VOMITING   polyethylene glycol (MIRALAX / GLYCOLAX) 17 g packet Take 17 g by mouth daily as needed for mild constipation.   Polyvinyl Alcohol-Povidone PF (REFRESH) 1.4-0.6 % SOLN Place 2 drops into both eyes daily as needed.   [DISCONTINUED] amLODipine (NORVASC) 10 MG tablet Take 1 tablet (10 mg total) by mouth daily.   [DISCONTINUED] insulin glargine (LANTUS SOLOSTAR) 100 UNIT/ML Solostar Pen INJECT 10 UNITS INTO THE SKIN DAILY AT 10 PM.   [DISCONTINUED] Insulin Pen Needle (NOVOFINE) 30G X 8 MM MISC Administer  insulin subcutaneously once daily. ICD 10 E.11   [DISCONTINUED] Lancets (ACCU-CHEK MULTICLIX) lancets USE TO CHECK BLOOD SUGAR TWICE DAILY   [DISCONTINUED] lisinopril (ZESTRIL) 20 MG tablet Take 1 tablet (20 mg total) by mouth daily.   [DISCONTINUED] simvastatin (ZOCOR) 10 MG tablet Take 1 tablet (10 mg total) by mouth every evening.   [DISCONTINUED] sitaGLIPtin (JANUVIA) 25 MG tablet Take 1 tablet (25 mg total) by mouth daily.   amLODipine (NORVASC) 10 MG tablet Take 1 tablet (10 mg total) by mouth daily.   lisinopril (ZESTRIL) 20 MG tablet Take 1 tablet (20 mg total) by mouth daily.   oxyCODONE (ROXICODONE) 5 MG immediate release tablet Take 1 tablet (5 mg total) by mouth every 4 (four) hours as needed for severe pain.   sennosides-docusate sodium (SENOKOT-S) 8.6-50 MG tablet Take 2 tablets by mouth daily.   simvastatin (ZOCOR) 10 MG tablet Take 1 tablet (10 mg total) by mouth every evening.   sitaGLIPtin (JANUVIA) 25 MG tablet Take 1 tablet (25 mg total) by mouth daily.   No facility-administered encounter medications on file as of 07/13/2022.     Review of Systems  Review of Systems  Constitutional: Negative.   HENT: Negative.    Cardiovascular: Negative.   Gastrointestinal: Negative.   Allergic/Immunologic: Negative.   Neurological: Negative.   Psychiatric/Behavioral: Negative.         Physical Exam  BP 133/78 (BP Location: Left Arm, Patient Position: Sitting, Cuff Size: Large)   Pulse 74   Temp 98 F (36.7 C)   Ht 5\' 8"  (1.727 m)   Wt 218 lb (98.9 kg)   SpO2 100%   BMI 33.15 kg/m   Wt Readings from Last 5 Encounters:  07/13/22 218 lb (98.9 kg)  04/07/22 220 lb 6.4 oz (100 kg)  10/07/21 219 lb 12.8 oz (99.7 kg)  04/01/21 224 lb (101.6 kg)  01/26/21 224 lb 6.4 oz (101.8 kg)     Physical Exam Vitals and nursing note reviewed.  Constitutional:      General: She is not in acute distress.    Appearance: She is well-developed.  Cardiovascular:     Rate and  Rhythm: Normal rate and regular rhythm.  Pulmonary:     Effort: Pulmonary effort is normal.     Breath sounds: Normal breath sounds.  Neurological:     Mental Status: She is alert and oriented to person, place, and time.      Lab Results:  CBC    Component Value Date/Time   WBC 4.4 07/13/2022 1147   WBC 10.9 (H) 10/29/2019 0241   RBC 4.15 07/13/2022 1147   RBC 3.19 (L) 10/29/2019 0241   HGB 12.0 07/13/2022 1147   HCT 36.8 07/13/2022 1147   PLT 321 07/13/2022 1147   MCV 89 07/13/2022 1147   MCH 28.9 07/13/2022 1147   MCH 28.8 10/29/2019 0241   MCHC 32.6 07/13/2022 1147   MCHC 31.0 10/29/2019 0241  RDW 12.3 07/13/2022 1147   LYMPHSABS 1.9 01/26/2021 0947   MONOABS 480 05/03/2017 1025   EOSABS 0.1 01/26/2021 0947   BASOSABS 0.1 01/26/2021 0947    BMET    Component Value Date/Time   NA 142 07/13/2022 1147   K 3.6 07/13/2022 1147   CL 99 07/13/2022 1147   CO2 27 07/13/2022 1147   GLUCOSE 98 07/13/2022 1147   GLUCOSE 106 (H) 10/29/2019 0241   BUN 10 07/13/2022 1147   CREATININE 0.83 07/13/2022 1147   CREATININE 0.79 08/03/2017 1039   CALCIUM 9.6 07/13/2022 1147   GFRNONAA 82 01/26/2021 0947   GFRNONAA 82 08/03/2017 1039   GFRAA 95 01/26/2021 0947   GFRAA >89 08/03/2017 1039    BNP No results found for: "BNP"  ProBNP No results found for: "PROBNP"  Imaging: No results found.   Assessment & Plan:   Type 2 diabetes mellitus with complication, without long-term current use of insulin (HCC) - POCT glycosylated hemoglobin (Hb A1C) - simvastatin (ZOCOR) 10 MG tablet; Take 1 tablet (10 mg total) by mouth every evening.  Dispense: 90 tablet; Refill: 3 - sitaGLIPtin (JANUVIA) 25 MG tablet; Take 1 tablet (25 mg total) by mouth daily.  Dispense: 90 tablet; Refill: 3  2. HTN (hypertension), malignant  - amLODipine (NORVASC) 10 MG tablet; Take 1 tablet (10 mg total) by mouth daily.  Dispense: 90 tablet; Refill: 3 - lisinopril (ZESTRIL) 20 MG tablet; Take 1 tablet  (20 mg total) by mouth daily.  Dispense: 90 tablet; Refill: 3 - CBC - Comprehensive metabolic panel - Lipid Panel  3. Hyperlipidemia, unspecified hyperlipidemia type  - simvastatin (ZOCOR) 10 MG tablet; Take 1 tablet (10 mg total) by mouth every evening.  Dispense: 90 tablet; Refill: 3 - CBC - Comprehensive metabolic panel - Lipid Panel  Follow up:  Follow up in 3 months or sooner if needed     Ivonne Andrew, NP 07/20/2022

## 2022-07-13 NOTE — Patient Instructions (Signed)
1. Type 2 diabetes mellitus with complication, without long-term current use of insulin (HCC)  - POCT glycosylated hemoglobin (Hb A1C) - simvastatin (ZOCOR) 10 MG tablet; Take 1 tablet (10 mg total) by mouth every evening.  Dispense: 90 tablet; Refill: 3 - sitaGLIPtin (JANUVIA) 25 MG tablet; Take 1 tablet (25 mg total) by mouth daily.  Dispense: 90 tablet; Refill: 3  2. HTN (hypertension), malignant  - amLODipine (NORVASC) 10 MG tablet; Take 1 tablet (10 mg total) by mouth daily.  Dispense: 90 tablet; Refill: 3 - lisinopril (ZESTRIL) 20 MG tablet; Take 1 tablet (20 mg total) by mouth daily.  Dispense: 90 tablet; Refill: 3 - CBC - Comprehensive metabolic panel - Lipid Panel  3. Hyperlipidemia, unspecified hyperlipidemia type  - simvastatin (ZOCOR) 10 MG tablet; Take 1 tablet (10 mg total) by mouth every evening.  Dispense: 90 tablet; Refill: 3 - CBC - Comprehensive metabolic panel - Lipid Panel  Follow up:  Follow up in 3 months or sooner if needed

## 2022-07-14 LAB — LIPID PANEL
Chol/HDL Ratio: 3.5 ratio (ref 0.0–4.4)
Cholesterol, Total: 191 mg/dL (ref 100–199)
HDL: 55 mg/dL (ref >39)
LDL Chol Calc (NIH): 120 mg/dL — ABNORMAL HIGH (ref 0–99)
Triglycerides: 88 mg/dL (ref 0–149)
VLDL Cholesterol Cal: 16 mg/dL (ref 5–40)

## 2022-07-14 LAB — COMPREHENSIVE METABOLIC PANEL
ALT: 33 IU/L — ABNORMAL HIGH (ref 0–32)
AST: 31 IU/L (ref 0–40)
Albumin/Globulin Ratio: 1.4 (ref 1.2–2.2)
Albumin: 4.5 g/dL (ref 3.9–4.9)
Alkaline Phosphatase: 67 IU/L (ref 44–121)
BUN/Creatinine Ratio: 12 (ref 12–28)
BUN: 10 mg/dL (ref 8–27)
Bilirubin Total: 0.7 mg/dL (ref 0.0–1.2)
CO2: 27 mmol/L (ref 20–29)
Calcium: 9.6 mg/dL (ref 8.7–10.3)
Chloride: 99 mmol/L (ref 96–106)
Creatinine, Ser: 0.83 mg/dL (ref 0.57–1.00)
Globulin, Total: 3.2 g/dL (ref 1.5–4.5)
Glucose: 98 mg/dL (ref 70–99)
Potassium: 3.6 mmol/L (ref 3.5–5.2)
Sodium: 142 mmol/L (ref 134–144)
Total Protein: 7.7 g/dL (ref 6.0–8.5)
eGFR: 78 mL/min/{1.73_m2} (ref >59)

## 2022-07-14 LAB — CBC
Hematocrit: 36.8 % (ref 34.0–46.6)
Hemoglobin: 12 g/dL (ref 11.1–15.9)
MCH: 28.9 pg (ref 26.6–33.0)
MCHC: 32.6 g/dL (ref 31.5–35.7)
MCV: 89 fL (ref 79–97)
Platelets: 321 10*3/uL (ref 150–450)
RBC: 4.15 x10E6/uL (ref 3.77–5.28)
RDW: 12.3 % (ref 11.7–15.4)
WBC: 4.4 10*3/uL (ref 3.4–10.8)

## 2022-07-17 ENCOUNTER — Other Ambulatory Visit: Payer: Self-pay

## 2022-07-17 DIAGNOSIS — E118 Type 2 diabetes mellitus with unspecified complications: Secondary | ICD-10-CM

## 2022-07-17 NOTE — Telephone Encounter (Signed)
Pt called for refill on he lancets

## 2022-07-18 ENCOUNTER — Other Ambulatory Visit: Payer: Self-pay

## 2022-07-18 NOTE — Addendum Note (Signed)
Addended by: Radford Pax M on: 07/18/2022 11:24 AM   Modules accepted: Orders

## 2022-07-18 NOTE — Telephone Encounter (Signed)
Patient contacted today and patient states that she is having difficulty with getting the Lantus refilled (not the lancets as previously stated).   Patient would like it sent to the [facility] on [address].

## 2022-07-20 ENCOUNTER — Encounter: Payer: Self-pay | Admitting: Nurse Practitioner

## 2022-07-20 DIAGNOSIS — E118 Type 2 diabetes mellitus with unspecified complications: Secondary | ICD-10-CM | POA: Insufficient documentation

## 2022-07-20 NOTE — Assessment & Plan Note (Signed)
-   POCT glycosylated hemoglobin (Hb A1C) - simvastatin (ZOCOR) 10 MG tablet; Take 1 tablet (10 mg total) by mouth every evening.  Dispense: 90 tablet; Refill: 3 - sitaGLIPtin (JANUVIA) 25 MG tablet; Take 1 tablet (25 mg total) by mouth daily.  Dispense: 90 tablet; Refill: 3  2. HTN (hypertension), malignant  - amLODipine (NORVASC) 10 MG tablet; Take 1 tablet (10 mg total) by mouth daily.  Dispense: 90 tablet; Refill: 3 - lisinopril (ZESTRIL) 20 MG tablet; Take 1 tablet (20 mg total) by mouth daily.  Dispense: 90 tablet; Refill: 3 - CBC - Comprehensive metabolic panel - Lipid Panel  3. Hyperlipidemia, unspecified hyperlipidemia type  - simvastatin (ZOCOR) 10 MG tablet; Take 1 tablet (10 mg total) by mouth every evening.  Dispense: 90 tablet; Refill: 3 - CBC - Comprehensive metabolic panel - Lipid Panel  Follow up:  Follow up in 3 months or sooner if needed

## 2022-09-12 ENCOUNTER — Telehealth: Payer: Self-pay

## 2022-09-12 ENCOUNTER — Telehealth: Payer: Self-pay | Admitting: Nurse Practitioner

## 2022-09-12 DIAGNOSIS — M5440 Lumbago with sciatica, unspecified side: Secondary | ICD-10-CM

## 2022-09-12 DIAGNOSIS — M1711 Unilateral primary osteoarthritis, right knee: Secondary | ICD-10-CM

## 2022-09-12 MED ORDER — BACLOFEN 10 MG PO TABS: 10.0000 mg | ORAL_TABLET | Freq: Three times a day (TID) | ORAL | 0 refills | Status: DC

## 2022-09-12 NOTE — Telephone Encounter (Signed)
No additional notes needed  

## 2022-10-13 ENCOUNTER — Ambulatory Visit (INDEPENDENT_AMBULATORY_CARE_PROVIDER_SITE_OTHER): Payer: Medicare Other | Admitting: Nurse Practitioner

## 2022-10-13 ENCOUNTER — Encounter: Payer: Self-pay | Admitting: Nurse Practitioner

## 2022-10-13 VITALS — BP 152/88 | HR 74 | Wt 225.0 lb

## 2022-10-13 DIAGNOSIS — E118 Type 2 diabetes mellitus with unspecified complications: Secondary | ICD-10-CM

## 2022-10-13 LAB — POCT GLYCOSYLATED HEMOGLOBIN (HGB A1C): HbA1c, POC (controlled diabetic range): 7.1 % — AB (ref 0.0–7.0)

## 2022-10-13 LAB — GLUCOSE, POCT (MANUAL RESULT ENTRY): POC Glucose: 98 mg/dl (ref 70–99)

## 2022-10-13 NOTE — Progress Notes (Unsigned)
@Patient  ID: , female    DOB: 30-Jun-1956, 66 y.o.   MRN: [id number]  Chief Complaint  Patient presents with   Diabetes    Referring provider: [id number], NP   HPI  Candace Diaz 66 y.o. 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 To the [facility] for reevaluation of chronic illness.    Diabetes Mellitus: Patient presents for follow up of diabetes. Symptoms: none. Patient denies foot ulcerations, hypoglycemia , increase appetite, and nausea.  Evaluation to date has been included: hemoglobin A1C. Treatment to date: no recent interventions.    Denies f/c/s, n/v/d, hemoptysis, PND, leg swelling Denies chest pain or edema     Allergies  Allergen Reactions   Aspirin Other (See Comments)    Internal bleeding   Penicillins Hives    Did it involve swelling of the face/tongue/throat, SOB, or low BP? Unknown Did it involve sudden or severe rash/hives, skin peeling, or any reaction on the inside of your mouth or nose? Unknown Did you need to seek medical attention at a hospital or doctor's office? Unknown When did it last happen?      years If all above answers are "NO", may proceed with cephalosporin use.    Immunization History  Administered Date(s) Administered   Influenza,inj,Quad PF,6+ Mos 12/03/2014, 09/09/2015, 10/30/2016, 08/31/2017, 09/09/2018, 10/07/2021   Pneumococcal Conjugate-13 01/24/2016   Pneumococcal Polysaccharide-23 08/14/2014   Tdap 08/14/2014    Past Medical History:  Diagnosis Date   Chronic pain of right knee    Diabetes mellitus    type 2    History of CVA (cerebrovascular accident)    History of total knee arthroplasty, right 10/2019   Hypertension    Unsteady gait 07/2020    Tobacco History: Social History   Tobacco Use  Smoking Status Former    Types: Cigarettes   Quit date: 2000   Years since quitting: 23.8  Smokeless Tobacco Never   Counseling given: Not Answered   Outpatient Encounter Medications as of 10/13/2022  Medication Sig   acetaminophen (TYLENOL) 500 MG tablet Take 1,000 mg by mouth every 6 (six) hours as needed for moderate pain or headache.   albuterol (PROAIR HFA) 108 (90 Base) MCG/ACT inhaler INHALE 2 PUFFS BY MOUTH FOUR TIMES A DAY AS NEEDED FOR SHORTNESS OF BREATH.   amLODipine (NORVASC) 10 MG tablet Take 1 tablet (10 mg total) by mouth daily.   baclofen (LIORESAL) 10 MG tablet Take 1 tablet (10 mg total) by mouth 3 (three) times daily. As needed for muscle spasm   cholecalciferol (VITAMIN D3) 25 MCG (1000 UT) tablet Take 2,000 Units by mouth daily.   EASY TOUCH INSULIN SYRINGE 31G X 5/16" 0.5 ML MISC USE TO INJECT INSULIN EVERY DAY   glucose blood (ACCU-CHEK GUIDE) test strip Use as instructed   hydrochlorothiazide (HYDRODIURIL) 25 MG tablet Take 1 tablet (25 mg total) by mouth daily.   Insulin Pen Needle (B-D ULTRAFINE III SHORT PEN) 31G X 8 MM MISC USE WITH INSULIN INJECTIONS ONCE DAILY   Lancets (ACCU-CHEK MULTICLIX) lancets USE TO CHECK BLOOD SUGAR TWICE DAILY   LANTUS SOLOSTAR 100 UNIT/ML Solostar Pen INJECT 10 UNITS INTO THE SKIN DAILY AT 10 PM.   lisinopril (ZESTRIL) 20 MG tablet Take 1 tablet (20 mg total) by mouth daily.   Multiple Vitamins-Minerals (MULTIVITAMIN WITH MINERALS) tablet Take  2 tablets by mouth daily.    ondansetron (ZOFRAN) 4 MG tablet TAKE 1 TABLET BY MOUTH EVERY 8 HOURS AS NEEDED FOR NAUSEA FOR VOMITING   oxyCODONE (ROXICODONE) 5 MG immediate release tablet Take 1 tablet (5 mg total) by mouth every 4 (four) hours as needed for severe pain.   polyethylene glycol (MIRALAX / GLYCOLAX) 17 g packet Take 17 g by mouth daily as needed for mild constipation.   Polyvinyl Alcohol-Povidone PF (REFRESH) 1.4-0.6 % SOLN Place 2 drops into both eyes daily as needed.   sennosides-docusate sodium  (SENOKOT-S) 8.6-50 MG tablet Take 2 tablets by mouth daily.   simvastatin (ZOCOR) 10 MG tablet Take 1 tablet (10 mg total) by mouth every evening.   sitaGLIPtin (JANUVIA) 25 MG tablet Take 1 tablet (25 mg total) by mouth daily.   No facility-administered encounter medications on file as of 10/13/2022.     Review of Systems  Review of Systems  Constitutional: Negative.   HENT: Negative.    Cardiovascular: Negative.   Gastrointestinal: Negative.   Allergic/Immunologic: Negative.   Neurological: Negative.   Psychiatric/Behavioral: Negative.         Physical Exam  BP (!) 152/88   Pulse 74   Wt 225 lb (102.1 kg)   SpO2 100%   BMI 34.21 kg/m   Wt Readings from Last 5 Encounters:  10/13/22 225 lb (102.1 kg)  07/13/22 218 lb (98.9 kg)  04/07/22 220 lb 6.4 oz (100 kg)  10/07/21 219 lb 12.8 oz (99.7 kg)  04/01/21 224 lb (101.6 kg)     Physical Exam Vitals and nursing note reviewed.  Constitutional:      General: She is not in acute distress.    Appearance: She is well-developed.  Cardiovascular:     Rate and Rhythm: Normal rate and regular rhythm.  Pulmonary:     Effort: Pulmonary effort is normal.     Breath sounds: Normal breath sounds.  Neurological:     Mental Status: She is alert and oriented to person, place, and time.      Lab Results:  CBC    Component Value Date/Time   WBC 4.4 07/13/2022 1147   WBC 10.9 (H) 10/29/2019 0241   RBC 4.15 07/13/2022 1147   RBC 3.19 (L) 10/29/2019 0241   HGB 12.0 07/13/2022 1147   HCT 36.8 07/13/2022 1147   PLT 321 07/13/2022 1147   MCV 89 07/13/2022 1147   MCH 28.9 07/13/2022 1147   MCH 28.8 10/29/2019 0241   MCHC 32.6 07/13/2022 1147   MCHC 31.0 10/29/2019 0241   RDW 12.3 07/13/2022 1147   LYMPHSABS 1.9 01/26/2021 0947   MONOABS 480 05/03/2017 1025   EOSABS 0.1 01/26/2021 0947   BASOSABS 0.1 01/26/2021 0947    BMET    Component Value Date/Time   NA 142 07/13/2022 1147   K 3.6 07/13/2022 1147   CL 99  07/13/2022 1147   CO2 27 07/13/2022 1147   GLUCOSE 98 07/13/2022 1147   GLUCOSE 106 (H) 10/29/2019 0241   BUN 10 07/13/2022 1147   CREATININE 0.83 07/13/2022 1147   CREATININE 0.79 08/03/2017 1039   CALCIUM 9.6 07/13/2022 1147   GFRNONAA 82 01/26/2021 0947   GFRNONAA 82 08/03/2017 1039   GFRAA 95 01/26/2021 0947   GFRAA >89 08/03/2017 1039     Assessment & Plan:   Type 2 diabetes mellitus with complication, without long-term current use of insulin (HCC) - POCT glycosylated hemoglobin (Hb A1C) - POCT glucose (manual entry)  Follow  up:   Follow up in 3 months or sooner if needed     Fenton Foy, NP 10/16/2022

## 2022-10-13 NOTE — Progress Notes (Deleted)
225.0  Pian no  Fall no  Safe yes

## 2022-10-16 ENCOUNTER — Encounter: Payer: Self-pay | Admitting: Nurse Practitioner

## 2022-10-16 NOTE — Patient Instructions (Signed)
1. Type 2 diabetes mellitus with complication, without long-term current use of insulin (HCC)  - POCT glycosylated hemoglobin (Hb A1C) - POCT glucose (manual entry)  Follow up:   Follow up in 3 months or sooner if needed

## 2022-10-16 NOTE — Assessment & Plan Note (Signed)
-   POCT glycosylated hemoglobin (Hb A1C) - POCT glucose (manual entry)  Follow up:   Follow up in 3 months or sooner if needed

## 2022-11-20 ENCOUNTER — Telehealth: Payer: Self-pay | Admitting: Pharmacist

## 2022-11-20 NOTE — Telephone Encounter (Signed)
Patient attempted to be outreached to discuss hypertension, last documented blood pressure 152/88. Left voicemail for patient to return our call at their convenience.   Catie Eppie Gibson, PharmD, [facility] [phone]

## 2023-01-15 ENCOUNTER — Ambulatory Visit (INDEPENDENT_AMBULATORY_CARE_PROVIDER_SITE_OTHER): Payer: Medicare Other | Admitting: Nurse Practitioner

## 2023-01-15 ENCOUNTER — Encounter: Payer: Self-pay | Admitting: Nurse Practitioner

## 2023-01-15 VITALS — BP 125/75 | HR 82 | Temp 97.4°F | Resp 16 | Ht 66.0 in | Wt 230.0 lb

## 2023-01-15 DIAGNOSIS — E785 Hyperlipidemia, unspecified: Secondary | ICD-10-CM | POA: Diagnosis not present

## 2023-01-15 DIAGNOSIS — E118 Type 2 diabetes mellitus with unspecified complications: Secondary | ICD-10-CM | POA: Diagnosis not present

## 2023-01-15 DIAGNOSIS — I10 Essential (primary) hypertension: Secondary | ICD-10-CM | POA: Diagnosis not present

## 2023-01-15 LAB — POCT GLYCOSYLATED HEMOGLOBIN (HGB A1C)
HbA1c POC (<> result, manual entry): 7.4 % (ref 4.0–5.6)
Hemoglobin A1C: 7.4 % — AB (ref 4.0–5.6)

## 2023-01-15 MED ORDER — SITAGLIPTIN PHOSPHATE 25 MG PO TABS: 25.0000 mg | ORAL_TABLET | Freq: Every day | ORAL | 3 refills | Status: DC

## 2023-01-15 MED ORDER — LISINOPRIL 20 MG PO TABS: 20.0000 mg | ORAL_TABLET | Freq: Every day | ORAL | 3 refills | Status: DC

## 2023-01-15 MED ORDER — AMLODIPINE BESYLATE 10 MG PO TABS: 10.0000 mg | ORAL_TABLET | Freq: Every day | ORAL | 3 refills | Status: DC

## 2023-01-15 MED ORDER — SIMVASTATIN 10 MG PO TABS: 10.0000 mg | ORAL_TABLET | Freq: Every evening | ORAL | 3 refills | Status: DC

## 2023-01-15 MED ORDER — HYDROCHLOROTHIAZIDE 25 MG PO TABS: 25.0000 mg | ORAL_TABLET | Freq: Every day | ORAL | 3 refills | Status: DC

## 2023-01-15 NOTE — Patient Instructions (Signed)
1. Type 2 diabetes mellitus with complication, without long-term current use of insulin (HCC)  - Microalbumin/Creatinine Ratio, Urine - POCT glycosylated hemoglobin (Hb A1C) - Ambulatory referral to Podiatry - simvastatin (ZOCOR) 10 MG tablet; Take 1 tablet (10 mg total) by mouth every evening.  Dispense: 90 tablet; Refill: 3 - sitaGLIPtin (JANUVIA) 25 MG tablet; Take 1 tablet (25 mg total) by mouth daily.  Dispense: 90 tablet; Refill: 3  2. HTN (hypertension), malignant  - amLODipine (NORVASC) 10 MG tablet; Take 1 tablet (10 mg total) by mouth daily.  Dispense: 90 tablet; Refill: 3 - hydrochlorothiazide (HYDRODIURIL) 25 MG tablet; Take 1 tablet (25 mg total) by mouth daily.  Dispense: 90 tablet; Refill: 3 - lisinopril (ZESTRIL) 20 MG tablet; Take 1 tablet (20 mg total) by mouth daily.  Dispense: 90 tablet; Refill: 3  3. Hyperlipidemia, unspecified hyperlipidemia type  - simvastatin (ZOCOR) 10 MG tablet; Take 1 tablet (10 mg total) by mouth every evening.  Dispense: 90 tablet; Refill: 3  Follow up:  Follow up 3 months

## 2023-01-15 NOTE — Assessment & Plan Note (Signed)
-  Microalbumin/Creatinine Ratio, Urine - POCT glycosylated hemoglobin (Hb A1C) - Ambulatory referral to Podiatry - simvastatin (ZOCOR) 10 MG tablet; Take 1 tablet (10 mg total) by mouth every evening.  Dispense: 90 tablet; Refill: 3 - sitaGLIPtin (JANUVIA) 25 MG tablet; Take 1 tablet (25 mg total) by mouth daily.  Dispense: 90 tablet; Refill: 3  2. HTN (hypertension), malignant  - amLODipine (NORVASC) 10 MG tablet; Take 1 tablet (10 mg total) by mouth daily.  Dispense: 90 tablet; Refill: 3 - hydrochlorothiazide (HYDRODIURIL) 25 MG tablet; Take 1 tablet (25 mg total) by mouth daily.  Dispense: 90 tablet; Refill: 3 - lisinopril (ZESTRIL) 20 MG tablet; Take 1 tablet (20 mg total) by mouth daily.  Dispense: 90 tablet; Refill: 3  3. Hyperlipidemia, unspecified hyperlipidemia type  - simvastatin (ZOCOR) 10 MG tablet; Take 1 tablet (10 mg total) by mouth every evening.  Dispense: 90 tablet; Refill: 3  Follow up:  Follow up 3 months

## 2023-01-15 NOTE — Progress Notes (Signed)
@Patient  ID: Candace Diaz, female    DOB: [birth year], 67 y.o.   MRN: [id number]  Chief Complaint  Patient presents with   Follow-up    Diabetes follow up    Referring provider: Fenton Foy, NP   HPI  Candace Diaz 67 y.o. 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 To the [facility] for reevaluation of chronic illness.    Diabetes Mellitus: Patient presents for follow up of diabetes. Symptoms: none. Patient denies hypoglycemia , increase appetite, and nausea.  Evaluation to date has been included: hemoglobin A1C. Treatment to date: no recent interventions.    Denies f/c/s, n/v/d, hemoptysis, PND, leg swelling Denies chest pain or edema       Allergies  Allergen Reactions   Aspirin Other (See Comments)    Internal bleeding   Penicillins Hives    Did it involve swelling of the face/tongue/throat, SOB, or low BP? Unknown Did it involve sudden or severe rash/hives, skin peeling, or any reaction on the inside of your mouth or nose? Unknown Did you need to seek medical attention at a hospital or doctor's office? Unknown When did it last happen?      years If all above answers are "NO", may proceed with cephalosporin use.    Immunization History  Administered Date(s) Administered   Influenza,inj,Quad PF,6+ Mos 12/03/2014, 09/09/2015, 10/30/2016, 08/31/2017, 09/09/2018, 10/07/2021   Pneumococcal Conjugate-13 01/24/2016   Pneumococcal Polysaccharide-23 08/14/2014   Tdap 08/14/2014    Past Medical History:  Diagnosis Date   Chronic pain of right knee    Diabetes mellitus    type 2    History of CVA (cerebrovascular accident)    History of total knee arthroplasty, right 10/2019   Hypertension    Unsteady gait 07/2020    Tobacco History: Social History   Tobacco Use  Smoking Status  Former   Types: Cigarettes   Quit date: 2000   Years since quitting: 24.0  Smokeless Tobacco Never   Counseling given: Not Answered   Outpatient Encounter Medications as of 01/15/2023  Medication Sig   acetaminophen (TYLENOL) 500 MG tablet Take 1,000 mg by mouth every 6 (six) hours as needed for moderate pain or headache.   albuterol (PROAIR HFA) 108 (90 Base) MCG/ACT inhaler INHALE 2 PUFFS BY MOUTH FOUR TIMES A DAY AS NEEDED FOR SHORTNESS OF BREATH.   baclofen (LIORESAL) 10 MG tablet Take 1 tablet (10 mg total) by mouth 3 (three) times daily. As needed for muscle spasm   cholecalciferol (VITAMIN D3) 25 MCG (1000 UT) tablet Take 2,000 Units by mouth daily.   EASY TOUCH INSULIN SYRINGE 31G X 5/16" 0.5 ML MISC USE TO INJECT INSULIN EVERY DAY   glucose blood (ACCU-CHEK GUIDE) test strip Use as instructed   Insulin Pen Needle (B-D ULTRAFINE III SHORT PEN) 31G X 8 MM MISC USE WITH INSULIN INJECTIONS ONCE DAILY   Lancets (ACCU-CHEK MULTICLIX) lancets USE TO CHECK BLOOD SUGAR TWICE DAILY   LANTUS SOLOSTAR 100 UNIT/ML Solostar Pen INJECT 10 UNITS INTO THE SKIN DAILY AT 10 PM.   Multiple Vitamins-Minerals (MULTIVITAMIN WITH MINERALS) tablet Take 2 tablets by mouth daily.    ondansetron (ZOFRAN) 4 MG tablet TAKE 1 TABLET BY MOUTH EVERY 8 HOURS AS NEEDED FOR NAUSEA FOR VOMITING   oxyCODONE (ROXICODONE) 5 MG immediate release tablet Take 1 tablet (5 mg total)  by mouth every 4 (four) hours as needed for severe pain.   polyethylene glycol (MIRALAX / GLYCOLAX) 17 g packet Take 17 g by mouth daily as needed for mild constipation.   Polyvinyl Alcohol-Povidone PF (REFRESH) 1.4-0.6 % SOLN Place 2 drops into both eyes daily as needed.   sennosides-docusate sodium (SENOKOT-S) 8.6-50 MG tablet Take 2 tablets by mouth daily.   [DISCONTINUED] amLODipine (NORVASC) 10 MG tablet Take 1 tablet (10 mg total) by mouth daily.   [DISCONTINUED] hydrochlorothiazide (HYDRODIURIL) 25 MG tablet Take 1 tablet (25 mg total) by  mouth daily.   [DISCONTINUED] lisinopril (ZESTRIL) 20 MG tablet Take 1 tablet (20 mg total) by mouth daily.   [DISCONTINUED] simvastatin (ZOCOR) 10 MG tablet Take 1 tablet (10 mg total) by mouth every evening.   [DISCONTINUED] sitaGLIPtin (JANUVIA) 25 MG tablet Take 1 tablet (25 mg total) by mouth daily.   amLODipine (NORVASC) 10 MG tablet Take 1 tablet (10 mg total) by mouth daily.   hydrochlorothiazide (HYDRODIURIL) 25 MG tablet Take 1 tablet (25 mg total) by mouth daily.   lisinopril (ZESTRIL) 20 MG tablet Take 1 tablet (20 mg total) by mouth daily.   simvastatin (ZOCOR) 10 MG tablet Take 1 tablet (10 mg total) by mouth every evening.   sitaGLIPtin (JANUVIA) 25 MG tablet Take 1 tablet (25 mg total) by mouth daily.   No facility-administered encounter medications on file as of 01/15/2023.     Review of Systems  Review of Systems  Constitutional: Negative.   HENT: Negative.    Cardiovascular: Negative.   Gastrointestinal: Negative.   Allergic/Immunologic: Negative.   Neurological: Negative.   Psychiatric/Behavioral: Negative.         Physical Exam  BP 125/75   Pulse 82   Temp (!) 97.4 F (36.3 C) (Temporal)   Resp 16   Ht 5\' 6"  (1.676 m)   Wt 230 lb (104.3 kg)   SpO2 100%   BMI 37.12 kg/m   Wt Readings from Last 5 Encounters:  01/15/23 230 lb (104.3 kg)  10/13/22 225 lb (102.1 kg)  07/13/22 218 lb (98.9 kg)  04/07/22 220 lb 6.4 oz (100 kg)  10/07/21 219 lb 12.8 oz (99.7 kg)     Physical Exam Vitals and nursing note reviewed.  Constitutional:      General: She is not in acute distress.    Appearance: She is well-developed.  Cardiovascular:     Rate and Rhythm: Normal rate and regular rhythm.  Pulmonary:     Effort: Pulmonary effort is normal.     Breath sounds: Normal breath sounds.  Neurological:     Mental Status: She is alert and oriented to person, place, and time.      Lab Results:  CBC    Component Value Date/Time   WBC 4.4 07/13/2022 1147    WBC 10.9 (H) 10/29/2019 0241   RBC 4.15 07/13/2022 1147   RBC 3.19 (L) 10/29/2019 0241   HGB 12.0 07/13/2022 1147   HCT 36.8 07/13/2022 1147   PLT 321 07/13/2022 1147   MCV 89 07/13/2022 1147   MCH 28.9 07/13/2022 1147   MCH 28.8 10/29/2019 0241   MCHC 32.6 07/13/2022 1147   MCHC 31.0 10/29/2019 0241   RDW 12.3 07/13/2022 1147   LYMPHSABS 1.9 01/26/2021 0947   MONOABS 480 05/03/2017 1025   EOSABS 0.1 01/26/2021 0947   BASOSABS 0.1 01/26/2021 0947    BMET    Component Value Date/Time   NA 142 07/13/2022 1147   K  3.6 07/13/2022 1147   CL 99 07/13/2022 1147   CO2 27 07/13/2022 1147   GLUCOSE 98 07/13/2022 1147   GLUCOSE 106 (H) 10/29/2019 0241   BUN 10 07/13/2022 1147   CREATININE 0.83 07/13/2022 1147   CREATININE 0.79 08/03/2017 1039   CALCIUM 9.6 07/13/2022 1147   GFRNONAA 82 01/26/2021 0947   GFRNONAA 82 08/03/2017 1039   GFRAA 95 01/26/2021 0947   GFRAA >89 08/03/2017 1039      Assessment & Plan:   Type 2 diabetes mellitus with complication, without long-term current use of insulin (HCC) - Microalbumin/Creatinine Ratio, Urine - POCT glycosylated hemoglobin (Hb A1C) - Ambulatory referral to Podiatry - simvastatin (ZOCOR) 10 MG tablet; Take 1 tablet (10 mg total) by mouth every evening.  Dispense: 90 tablet; Refill: 3 - sitaGLIPtin (JANUVIA) 25 MG tablet; Take 1 tablet (25 mg total) by mouth daily.  Dispense: 90 tablet; Refill: 3  2. HTN (hypertension), malignant  - amLODipine (NORVASC) 10 MG tablet; Take 1 tablet (10 mg total) by mouth daily.  Dispense: 90 tablet; Refill: 3 - hydrochlorothiazide (HYDRODIURIL) 25 MG tablet; Take 1 tablet (25 mg total) by mouth daily.  Dispense: 90 tablet; Refill: 3 - lisinopril (ZESTRIL) 20 MG tablet; Take 1 tablet (20 mg total) by mouth daily.  Dispense: 90 tablet; Refill: 3  3. Hyperlipidemia, unspecified hyperlipidemia type  - simvastatin (ZOCOR) 10 MG tablet; Take 1 tablet (10 mg total) by mouth every evening.  Dispense:  90 tablet; Refill: 3  Follow up:  Follow up 3 months     Fenton Foy, NP 01/15/2023

## 2023-01-16 LAB — CBC WITH DIFFERENTIAL/PLATELET
Basophils Absolute: 0.1 10*3/uL (ref 0.0–0.2)
Basos: 1 %
EOS (ABSOLUTE): 0.1 10*3/uL (ref 0.0–0.4)
Eos: 2 %
Hematocrit: 34.6 % (ref 34.0–46.6)
Hemoglobin: 11.2 g/dL (ref 11.1–15.9)
Immature Grans (Abs): 0 10*3/uL (ref 0.0–0.1)
Immature Granulocytes: 0 %
Lymphocytes Absolute: 2.1 10*3/uL (ref 0.7–3.1)
Lymphs: 38 %
MCH: 29.2 pg (ref 26.6–33.0)
MCHC: 32.4 g/dL (ref 31.5–35.7)
MCV: 90 fL (ref 79–97)
Monocytes Absolute: 0.5 10*3/uL (ref 0.1–0.9)
Monocytes: 9 %
Neutrophils Absolute: 2.7 10*3/uL (ref 1.4–7.0)
Neutrophils: 50 %
Platelets: 305 10*3/uL (ref 150–450)
RBC: 3.83 x10E6/uL (ref 3.77–5.28)
RDW: 12.4 % (ref 11.7–15.4)
WBC: 5.4 10*3/uL (ref 3.4–10.8)

## 2023-01-16 LAB — CMP14+EGFR
ALT: 54 IU/L — ABNORMAL HIGH (ref 0–32)
AST: 41 IU/L — ABNORMAL HIGH (ref 0–40)
Albumin/Globulin Ratio: 1.3 (ref 1.2–2.2)
Albumin: 4.3 g/dL (ref 3.9–4.9)
Alkaline Phosphatase: 81 IU/L (ref 44–121)
BUN/Creatinine Ratio: 15 (ref 12–28)
BUN: 13 mg/dL (ref 8–27)
Bilirubin Total: 0.5 mg/dL (ref 0.0–1.2)
CO2: 24 mmol/L (ref 20–29)
Calcium: 9.2 mg/dL (ref 8.7–10.3)
Chloride: 100 mmol/L (ref 96–106)
Creatinine, Ser: 0.85 mg/dL (ref 0.57–1.00)
Globulin, Total: 3.2 g/dL (ref 1.5–4.5)
Glucose: 109 mg/dL — ABNORMAL HIGH (ref 70–99)
Potassium: 3.5 mmol/L (ref 3.5–5.2)
Sodium: 139 mmol/L (ref 134–144)
Total Protein: 7.5 g/dL (ref 6.0–8.5)
eGFR: 76 mL/min/{1.73_m2} (ref >59)

## 2023-01-16 LAB — MICROALBUMIN / CREATININE URINE RATIO
Creatinine, Urine: 171.8 mg/dL
Microalb/Creat Ratio: 17 mg/g creat (ref 0–29)
Microalbumin, Urine: 28.5 ug/mL

## 2023-01-16 LAB — LIPID PANEL
Chol/HDL Ratio: 2.7 ratio (ref 0.0–4.4)
Cholesterol, Total: 168 mg/dL (ref 100–199)
HDL: 62 mg/dL (ref >39)
LDL Chol Calc (NIH): 92 mg/dL (ref 0–99)
Triglycerides: 76 mg/dL (ref 0–149)
VLDL Cholesterol Cal: 14 mg/dL (ref 5–40)

## 2023-01-31 ENCOUNTER — Ambulatory Visit: Payer: Medicare Other | Admitting: Podiatry

## 2023-02-14 ENCOUNTER — Ambulatory Visit (INDEPENDENT_AMBULATORY_CARE_PROVIDER_SITE_OTHER): Payer: Medicare Other | Admitting: Podiatry

## 2023-02-14 DIAGNOSIS — E119 Type 2 diabetes mellitus without complications: Secondary | ICD-10-CM

## 2023-02-14 DIAGNOSIS — B351 Tinea unguium: Secondary | ICD-10-CM | POA: Diagnosis not present

## 2023-02-14 NOTE — Progress Notes (Signed)
Subjective: Candace Diaz presents today referred by Fenton Foy, NP for diabetic foot evaluation.  Patient relates many year history of diabetes.  Patient denies any history of foot wounds.  Patient denies any history of numbness, tingling, burning, pins/needles sensations.  Past Medical History:  Diagnosis Date   Chronic pain of right knee    Diabetes mellitus    type 2    History of CVA (cerebrovascular accident)    History of total knee arthroplasty, right 10/2019   Hypertension    Unsteady gait 07/2020    Patient Active Problem List   Diagnosis Date Noted   Type 2 diabetes mellitus with complication, without long-term current use of insulin ([place]) 07/20/2022   Osteoarthritis of right knee 10/28/2019   S/P TKR (total knee replacement) using cement, right 10/28/2019   HTN (hypertension), malignant 12/03/2014   DOE (dyspnea on exertion) 12/03/2014   Acute serous otitis media 08/20/2014   Essential hypertension, malignant 08/20/2014   Other and unspecified hyperlipidemia 08/20/2014   T2DM (type 2 diabetes mellitus) ([place]) 08/20/2014   Immunization due 08/20/2014   Need for Tdap vaccination 08/20/2014   History of palpitations 08/20/2014   History of CVA with residual deficit 08/20/2014   Arthritis of right knee 08/20/2014    Past Surgical History:  Procedure Laterality Date   BREAST BIOPSY Left unsure   CATARACT EXTRACTION Right    surgery on ears   10 or 67 yo   " they did surgery to make me hear better but it just made it worse"    TONSILLECTOMY  childhood   TOTAL KNEE ARTHROPLASTY Right 10/28/2019   Procedure: TOTAL KNEE ARTHROPLASTY;  Surgeon: Marchia Bond, MD;  Location: [facility];  Service: Orthopedics;  Laterality: Right;    Current Outpatient Medications on File Prior to Visit  Medication Sig Dispense Refill   acetaminophen (TYLENOL) 500 MG tablet Take 1,000 mg by mouth every 6 (six) hours as needed for moderate pain or headache.     albuterol (PROAIR  HFA) 108 (90 Base) MCG/ACT inhaler INHALE 2 PUFFS BY MOUTH FOUR TIMES A DAY AS NEEDED FOR SHORTNESS OF BREATH. 8.5 each 11   amLODipine (NORVASC) 10 MG tablet Take 1 tablet (10 mg total) by mouth daily. 90 tablet 3   baclofen (LIORESAL) 10 MG tablet Take 1 tablet (10 mg total) by mouth 3 (three) times daily. As needed for muscle spasm 50 tablet 0   cholecalciferol (VITAMIN D3) 25 MCG (1000 UT) tablet Take 2,000 Units by mouth daily.     EASY TOUCH INSULIN SYRINGE 31G X 5/16" 0.5 ML MISC USE TO INJECT INSULIN EVERY DAY 100 each 1   glucose blood (ACCU-CHEK GUIDE) test strip Use as instructed 102 each 12   hydrochlorothiazide (HYDRODIURIL) 25 MG tablet Take 1 tablet (25 mg total) by mouth daily. 90 tablet 3   Insulin Pen Needle (B-D ULTRAFINE III SHORT PEN) 31G X 8 MM MISC USE WITH INSULIN INJECTIONS ONCE DAILY 300 each 3   Lancets (ACCU-CHEK MULTICLIX) lancets USE TO CHECK BLOOD SUGAR TWICE DAILY 102 each 0   LANTUS SOLOSTAR 100 UNIT/ML Solostar Pen INJECT 10 UNITS INTO THE SKIN DAILY AT 10 PM. 15 mL 44   lisinopril (ZESTRIL) 20 MG tablet Take 1 tablet (20 mg total) by mouth daily. 90 tablet 3   Multiple Vitamins-Minerals (MULTIVITAMIN WITH MINERALS) tablet Take 2 tablets by mouth daily.      ondansetron (ZOFRAN) 4 MG tablet TAKE 1 TABLET BY MOUTH EVERY 8 HOURS  AS NEEDED FOR NAUSEA FOR VOMITING 20 tablet 2   oxyCODONE (ROXICODONE) 5 MG immediate release tablet Take 1 tablet (5 mg total) by mouth every 4 (four) hours as needed for severe pain. 30 tablet 0   polyethylene glycol (MIRALAX / GLYCOLAX) 17 g packet Take 17 g by mouth daily as needed for mild constipation. 14 each prn   Polyvinyl Alcohol-Povidone PF (REFRESH) 1.4-0.6 % SOLN Place 2 drops into both eyes daily as needed. 1 each 5   sennosides-docusate sodium (SENOKOT-S) 8.6-50 MG tablet Take 2 tablets by mouth daily. 30 tablet 1   simvastatin (ZOCOR) 10 MG tablet Take 1 tablet (10 mg total) by mouth every evening. 90 tablet 3   sitaGLIPtin  (JANUVIA) 25 MG tablet Take 1 tablet (25 mg total) by mouth daily. 90 tablet 3   No current facility-administered medications on file prior to visit.     Allergies  Allergen Reactions   Aspirin Other (See Comments)    Internal bleeding   Penicillins Hives    Did it involve swelling of the face/tongue/throat, SOB, or low BP? Unknown Did it involve sudden or severe rash/hives, skin peeling, or any reaction on the inside of your mouth or nose? Unknown Did you need to seek medical attention at a hospital or doctor's office? Unknown When did it last happen?      years If all above answers are "NO", may proceed with cephalosporin use.    Social History   Occupational History   Not on file  Tobacco Use   Smoking status: Former    Types: Cigarettes    Quit date: 2000    Years since quitting: 24.1   Smokeless tobacco: Never  Vaping Use   Vaping Use: Never used  Substance and Sexual Activity   Alcohol use: No   Drug use: No   Sexual activity: Not Currently    Family History  Problem Relation Age of Onset   Diabetes Brother    Hypertension Brother    Diabetes Other    Hypertension Other     Immunization History  Administered Date(s) Administered   Influenza,inj,Quad PF,6+ Mos 12/03/2014, 09/09/2015, 10/30/2016, 08/31/2017, 09/09/2018, 10/07/2021   Pneumococcal Conjugate-13 01/24/2016   Pneumococcal Polysaccharide-23 08/14/2014   Tdap 08/14/2014    Review of systems: Positive Findings in bold print.  Constitutional:  chills, fatigue, fever, sweats, weight change Communication: Optometrist, sign Ecologist, hand writing, iPad/Android device Head: headaches, head injury Eyes: changes in vision, eye pain, glaucoma, cataracts, macular degeneration, diplopia, glare,  light sensitivity, eyeglasses or contacts, blindness Ears nose mouth throat: hearing impaired, hearing aids,  ringing in ears, deaf, sign language,  vertigo, nosebleeds,  rhinitis,  cold sores, snoring,  swollen glands Cardiovascular: HTN, edema, arrhythmia, pacemaker in place, defibrillator in place, chest pain/tightness, chronic anticoagulation, blood clot, heart failure, MI Peripheral Vascular: leg cramps, varicose veins, blood clots, lymphedema, varicosities Respiratory:  asthma, difficulty breathing, denies congestion, SOB, wheezing, cough, emphysema Gastrointestinal: change in appetite or weight, abdominal pain, constipation, diarrhea, nausea, vomiting, vomiting blood, change in bowel habits, abdominal pain, jaundice, rectal bleeding, hemorrhoids, GERD Genitourinary:  nocturia,  pain on urination, polyuria,  blood in urine, Foley catheter, urinary urgency, ESRD on hemodialysis Musculoskeletal: amputation, cramping, stiff joints, painful joints, decreased joint motion, fractures, OA, gout, hemiplegia, paraplegia, uses cane, wheelchair bound, uses walker, uses rollator Skin: +changes in toenails, color change, dryness, itching, mole changes,  rash, wound(s) Neurological: headaches, numbness in feet, paresthesias in feet, burning in feet, fainting,  seizures, change  in speech, migraines, memory problems/poor historian, cerebral palsy, weakness, paralysis, CVA, TIA Endocrine: diabetes, hypothyroidism, hyperthyroidism,  goiter, dry mouth, flushing, heat intolerance, cold intolerance,  excessive thirst, denies polyuria,  nocturia Hematological:  easy bleeding, excessive bleeding, easy bruising, enlarged lymph nodes, on long term blood thinner, history of past transusions Allergy/immunological:  hives, eczema, frequent infections, multiple drug allergies, seasonal allergies, transplant recipient, multiple food allergies Psychiatric:  anxiety, depression, mood disorder, suicidal ideations, hallucinations, insomnia  Objective: There were no vitals filed for this visit. Vascular Examination: Capillary refill time less than 3 seconds x 10 digits.  Dorsalis pedis pulses palpable 2 out of 4.  Posterior  tibial pulses palpable 2 out of 4.  Digital hair not present x 10 digits.  Skin temperature gradient WNL b/l.  Dermatological Examination: Skin with normal turgor, texture and tone b/l  Toenails 1-5 b/l discolored, thick, dystrophic with subungual debris and pain with palpation to nailbeds due to thickness of nails.  Musculoskeletal: Muscle strength 5/5 to all LE muscle groups.  Neurological: Sensation intact with 10 gram monofilament.  Vibratory sensation intact.  Assessment: NIDDM Encounter for diabetic foot examination  Plan: Discussed diabetic foot care principles. Literature dispensed on today. Patient to continue soft, supportive shoe gear daily. Patient to report any pedal injuries to medical professional immediately. Follow up one year. Patient/POA to call should there be a concern in the interim.

## 2023-03-25 ENCOUNTER — Other Ambulatory Visit: Payer: Self-pay | Admitting: Nurse Practitioner

## 2023-03-25 DIAGNOSIS — E118 Type 2 diabetes mellitus with unspecified complications: Secondary | ICD-10-CM

## 2023-03-26 ENCOUNTER — Other Ambulatory Visit: Payer: Self-pay | Admitting: Internal Medicine

## 2023-03-26 DIAGNOSIS — E118 Type 2 diabetes mellitus with unspecified complications: Secondary | ICD-10-CM

## 2023-03-27 NOTE — Telephone Encounter (Signed)
Provider not at this practice Requested Prescriptions  Pending Prescriptions Disp Refills   insulin degludec (TRESIBA FLEXTOUCH) 200 UNIT/ML FlexTouch Pen [Pharmacy Med Name: TRESIBA FLEXTOUCH 200 UNIT/ML]  0     There is no refill protocol information for this order

## 2023-04-16 ENCOUNTER — Ambulatory Visit (INDEPENDENT_AMBULATORY_CARE_PROVIDER_SITE_OTHER): Payer: Medicare Other | Admitting: Nurse Practitioner

## 2023-04-16 ENCOUNTER — Encounter: Payer: Self-pay | Admitting: Nurse Practitioner

## 2023-04-16 VITALS — BP 135/82 | HR 77 | Temp 97.1°F | Ht 68.0 in | Wt 228.0 lb

## 2023-04-16 DIAGNOSIS — E118 Type 2 diabetes mellitus with unspecified complications: Secondary | ICD-10-CM | POA: Diagnosis not present

## 2023-04-16 DIAGNOSIS — R7989 Other specified abnormal findings of blood chemistry: Secondary | ICD-10-CM | POA: Diagnosis not present

## 2023-04-16 DIAGNOSIS — Z13820 Encounter for screening for osteoporosis: Secondary | ICD-10-CM | POA: Diagnosis not present

## 2023-04-16 DIAGNOSIS — R0609 Other forms of dyspnea: Secondary | ICD-10-CM | POA: Diagnosis not present

## 2023-04-16 LAB — POCT GLYCOSYLATED HEMOGLOBIN (HGB A1C): Hemoglobin A1C: 7.6 % — AB (ref 4.0–5.6)

## 2023-04-16 MED ORDER — BD PEN NEEDLE SHORT U/F 31G X 8 MM MISC: 11 refills | Status: DC

## 2023-04-16 MED ORDER — LANTUS SOLOSTAR 100 UNIT/ML ~~LOC~~ SOPN: PEN_INJECTOR | SUBCUTANEOUS | 3 refills | Status: DC

## 2023-04-16 MED ORDER — ALBUTEROL SULFATE HFA 108 (90 BASE) MCG/ACT IN AERS: INHALATION_SPRAY | RESPIRATORY_TRACT | 11 refills | Status: DC

## 2023-04-16 NOTE — Progress Notes (Signed)
  ID: Candace Diaz, female    DOB: 10-16-1956, 67 y.o.   MRN: [id number]  Chief Complaint  Patient presents with   Diabetes    Follow up    Referring provider: Ivonne Andrew, NP   HPI  Candace Diaz 67 y.o. 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 To the [facility] for reevaluation of chronic illness.    Diabetes Mellitus: Patient presents for follow up of diabetes. Symptoms: none. Patient denies hypoglycemia , increase appetite, and nausea.  Evaluation to date has been included: hemoglobin A1C. Treatment to date: no recent interventions. A1C in office today is slightly higher at 7.4. will consult pharmacy for diabetic medication management.   Patient will need a recheck on CMP today due to elevated liver function with last check in January.  Patient is also due for DEXA scan for osteopenia porosis screening.   Denies f/c/s, n/v/d, hemoptysis, PND, leg swelling Denies chest pain or edema      Allergies  Allergen Reactions   Aspirin Other (See Comments)    Internal bleeding   Penicillins Hives    Did it involve swelling of the face/tongue/throat, SOB, or low BP? Unknown Did it involve sudden or severe rash/hives, skin peeling, or any reaction on the inside of your mouth or nose? Unknown Did you need to seek medical attention at a hospital or doctor's office? Unknown When did it last happen?      years If all above answers are "NO", may proceed with cephalosporin use.    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   amLODipine (NORVASC) 10 MG tablet Take 1 tablet (10 mg total) by mouth daily.   baclofen (LIORESAL) 10 MG tablet Take 1 tablet (10 mg total) by mouth 3 (three) times daily. As needed for muscle spasm   cholecalciferol (VITAMIN D3) 25 MCG (1000 UT) tablet Take 2,000 Units by mouth daily.   Cyanocobalamin (VITAMIN B 12 PO) Take by mouth.   EASY TOUCH INSULIN SYRINGE 31G X 5/16" 0.5 ML MISC USE TO INJECT INSULIN EVERY DAY   glucose blood (ACCU-CHEK GUIDE) test strip Use as instructed   hydrochlorothiazide (HYDRODIURIL) 25 MG tablet Take 1 tablet (25 mg total) by mouth daily.   Lancets (ACCU-CHEK MULTICLIX) lancets USE TO CHECK BLOOD SUGAR TWICE DAILY   lisinopril (ZESTRIL) 20 MG tablet Take 1 tablet (20 mg total) by mouth daily.   Multiple Vitamins-Minerals (MULTIVITAMIN WITH MINERALS) tablet Take 2 tablets by  mouth daily.    polyethylene glycol (MIRALAX / GLYCOLAX) 17 g packet Take 17 g by mouth daily as needed for mild constipation.   Polyvinyl Alcohol-Povidone PF (REFRESH) 1.4-0.6 % SOLN Place 2 drops into both eyes daily as needed.   sennosides-docusate sodium (SENOKOT-S) 8.6-50 MG tablet Take 2 tablets by mouth daily.   simvastatin (ZOCOR) 10 MG tablet Take 1 tablet (10 mg total) by mouth every evening.   sitaGLIPtin (JANUVIA) 25 MG tablet Take 1 tablet (25 mg total) by mouth daily.   [DISCONTINUED] albuterol  (PROAIR HFA) 108 (90 Base) MCG/ACT inhaler INHALE 2 PUFFS BY MOUTH FOUR TIMES A DAY AS NEEDED FOR SHORTNESS OF BREATH.   [DISCONTINUED] B-D ULTRAFINE III SHORT PEN 31G X 8 MM MISC USE WITH INSULIN INJECTIONS ONCE DAILY   albuterol (PROAIR HFA) 108 (90 Base) MCG/ACT inhaler INHALE 2 PUFFS BY MOUTH FOUR TIMES A DAY AS NEEDED FOR SHORTNESS OF BREATH.   insulin glargine (LANTUS SOLOSTAR) 100 UNIT/ML Solostar Pen Inject 10 units into the skin daily at 10 pm   Insulin Pen Needle (B-D ULTRAFINE III SHORT PEN) 31G X 8 MM MISC Use with insulin injections once daily   ondansetron (ZOFRAN) 4 MG tablet TAKE 1 TABLET BY MOUTH EVERY 8 HOURS AS NEEDED FOR NAUSEA FOR VOMITING (Patient not taking: Reported on 04/16/2023)   [DISCONTINUED] insulin glargine (LANTUS SOLOSTAR) 100 UNIT/ML Solostar Pen Inject 10 units into the skin daily at 10 pm   [DISCONTINUED] Insulin Pen Needle (B-D ULTRAFINE III SHORT PEN) 31G X 8 MM MISC Use with insulin injections once daily   [DISCONTINUED] LANTUS SOLOSTAR 100 UNIT/ML Solostar Pen INJECT 10 UNITS INTO THE SKIN DAILY AT 10 PM.   [DISCONTINUED] oxyCODONE (ROXICODONE) 5 MG immediate release tablet Take 1 tablet (5 mg total) by mouth every 4 (four) hours as needed for severe pain.   No facility-administered encounter medications on file as of 04/16/2023.     Review of Systems  Review of Systems  Constitutional: Negative.   HENT: Negative.    Cardiovascular: Negative.   Gastrointestinal: Negative.   Allergic/Immunologic: Negative.   Neurological: Negative.   Psychiatric/Behavioral: Negative.         Physical Exam  BP 135/82   Pulse 77   Temp (!) 97.1 F (36.2 C)   Ht  (1.727 m)   Wt 228 lb (103.4 kg)   SpO2 97%   BMI 34.67 kg/m   Wt Readings from Last 5 Encounters:  04/16/23 228 lb (103.4 kg)  01/15/23 230 lb (104.3 kg)  10/13/22 225 lb (102.1 kg)  07/13/22 218 lb (98.9 kg)  04/07/22 220 lb 6.4 oz (100 kg)     Physical Exam Vitals and nursing note  reviewed.  Constitutional:      General: She is not in acute distress.    Appearance: She is well-developed.  Cardiovascular:     Rate and Rhythm: Normal rate and regular rhythm.  Pulmonary:     Effort: Pulmonary effort is normal.     Breath sounds: Normal breath sounds.  Neurological:     Mental Status: She is alert and oriented to person, place, and time.      Lab Results:  CBC    Component Value Date/Time   WBC 5.4 01/15/2023 1137   WBC 10.9 (H) 10/29/2019 0241   RBC 3.83 01/15/2023 1137   RBC 3.19 (L) 10/29/2019 0241   HGB 11.2 01/15/2023 1137   HCT 34.6 01/15/2023 1137   PLT 305 01/15/2023 1137  MCV 90 01/15/2023 1137   MCH 29.2 01/15/2023 1137   MCH 28.8 10/29/2019 0241   MCHC 32.4 01/15/2023 1137   MCHC 31.0 10/29/2019 0241   RDW 12.4 01/15/2023 1137   LYMPHSABS 2.1 01/15/2023 1137   MONOABS 480 05/03/2017 1025   EOSABS 0.1 01/15/2023 1137   BASOSABS 0.1 01/15/2023 1137    BMET    Component Value Date/Time   NA 139 01/15/2023 1137   K 3.5 01/15/2023 1137   CL 100 01/15/2023 1137   CO2 24 01/15/2023 1137   GLUCOSE 109 (H) 01/15/2023 1137   GLUCOSE 106 (H) 10/29/2019 0241   BUN 13 01/15/2023 1137   CREATININE 0.85 01/15/2023 1137   CREATININE 0.79 08/03/2017 1039   CALCIUM 9.2 01/15/2023 1137   GFRNONAA 82 01/26/2021 0947   GFRNONAA 82 08/03/2017 1039   GFRAA 95 01/26/2021 0947   GFRAA >89 08/03/2017 1039     Assessment & Plan:   Type 2 diabetes mellitus with complication, without long-term current use of insulin (HCC) - POCT glycosylated hemoglobin (Hb A1C) - insulin glargine (LANTUS SOLOSTAR) 100 UNIT/ML Solostar Pen; Inject 10 units into the skin daily at 10 pm  Dispense: 15 mL; Refill: 3 - Insulin Pen Needle (B-D ULTRAFINE III SHORT PEN) 31G X 8 MM MISC; Use with insulin injections once daily  Dispense: 100 each; Refill: 11 - AMB Referral to Pharmacy Medication Management  2. Elevated liver function tests  - Comprehensive metabolic  panel  3. Osteoporosis screening  - HM DEXA SCAN  4. DOE (dyspnea on exertion)  - albuterol (PROAIR HFA) 108 (90 Base) MCG/ACT inhaler; INHALE 2 PUFFS BY MOUTH FOUR TIMES A DAY AS NEEDED FOR SHORTNESS OF BREATH.  Dispense: 8.5 each; Refill: 11  Follow up:  Follow up in 3 months     Ivonne Andrew, NP 04/16/2023

## 2023-04-16 NOTE — Patient Instructions (Signed)
1. Type 2 diabetes mellitus with complication, without long-term current use of insulin  - POCT glycosylated hemoglobin (Hb A1C) - insulin glargine (LANTUS SOLOSTAR) 100 UNIT/ML Solostar Pen; Inject 10 units into the skin daily at 10 pm  Dispense: 15 mL; Refill: 3 - Insulin Pen Needle (B-D ULTRAFINE III SHORT PEN) 31G X 8 MM MISC; Use with insulin injections once daily  Dispense: 100 each; Refill: 11 - AMB Referral to Pharmacy Medication Management  2. Elevated liver function tests  - Comprehensive metabolic panel  3. Osteoporosis screening  - HM DEXA SCAN  4. DOE (dyspnea on exertion)  - albuterol (PROAIR HFA) 108 (90 Base) MCG/ACT inhaler; INHALE 2 PUFFS BY MOUTH FOUR TIMES A DAY AS NEEDED FOR SHORTNESS OF BREATH.  Dispense: 8.5 each; Refill: 11  Follow up:  Follow up in 3 months

## 2023-04-16 NOTE — Assessment & Plan Note (Signed)
-   POCT glycosylated hemoglobin (Hb A1C) - insulin glargine (LANTUS SOLOSTAR) 100 UNIT/ML Solostar Pen; Inject 10 units into the skin daily at 10 pm  Dispense: 15 mL; Refill: 3 - Insulin Pen Needle (B-D ULTRAFINE III SHORT PEN) 31G X 8 MM MISC; Use with insulin injections once daily  Dispense: 100 each; Refill: 11 - AMB Referral to Pharmacy Medication Management  2. Elevated liver function tests  - Comprehensive metabolic panel  3. Osteoporosis screening  - HM DEXA SCAN  4. DOE (dyspnea on exertion)  - albuterol (PROAIR HFA) 108 (90 Base) MCG/ACT inhaler; INHALE 2 PUFFS BY MOUTH FOUR TIMES A DAY AS NEEDED FOR SHORTNESS OF BREATH.  Dispense: 8.5 each; Refill: 11  Follow up:  Follow up in 3 months

## 2023-04-17 ENCOUNTER — Telehealth: Payer: Self-pay

## 2023-04-17 LAB — COMPREHENSIVE METABOLIC PANEL
ALT: 37 IU/L — ABNORMAL HIGH (ref 0–32)
AST: 36 IU/L (ref 0–40)
Albumin/Globulin Ratio: 1.4 (ref 1.2–2.2)
Albumin: 4.6 g/dL (ref 3.9–4.9)
Alkaline Phosphatase: 77 IU/L (ref 44–121)
BUN/Creatinine Ratio: 16 (ref 12–28)
BUN: 13 mg/dL (ref 8–27)
Bilirubin Total: 0.6 mg/dL (ref 0.0–1.2)
CO2: 23 mmol/L (ref 20–29)
Calcium: 9.5 mg/dL (ref 8.7–10.3)
Chloride: 99 mmol/L (ref 96–106)
Creatinine, Ser: 0.83 mg/dL (ref 0.57–1.00)
Globulin, Total: 3.4 g/dL (ref 1.5–4.5)
Glucose: 151 mg/dL — ABNORMAL HIGH (ref 70–99)
Potassium: 3.3 mmol/L — ABNORMAL LOW (ref 3.5–5.2)
Sodium: 142 mmol/L (ref 134–144)
Total Protein: 8 g/dL (ref 6.0–8.5)
eGFR: 78 mL/min/{1.73_m2} (ref >59)

## 2023-04-17 NOTE — Progress Notes (Signed)
   Care Guide Note  04/17/2023 Name: Candace Diaz MRN: [id number] DOB: 1956-03-23  Referred by: Ivonne Andrew, NP Reason for referral : Care Coordination (Outreach to schedule referral with Pharm d )   Candace Diaz is a 67 y.o. year old female who is a primary care patient of Ivonne Andrew, NP. Candace Diaz was referred to the pharmacist for assistance related to DM.    Successful contact was made with the patient to discuss pharmacy services including being ready for the pharmacist to call at least 5 minutes before the scheduled appointment time, to have medication bottles and any blood sugar or blood pressure readings ready for review. The patient agreed to meet with the pharmacist via with the pharmacist via telephone visit on (date/time).  05/07/2023  Penne Lash, RMA Care Guide [facility]  [place], [place] [address] Direct Dial: [phone] [email or web address]

## 2023-04-17 NOTE — Progress Notes (Signed)
   Care Guide Note  04/17/2023 Name: Francyne Arreaga MRN: [id number] DOB: 04-May-1956  Referred by: Ivonne Andrew, NP Reason for referral : Care Coordination (Outreach to schedule referral with Pharm d )   Jasman Pfeifle Fehrman is a 67 y.o. year old female who is a primary care patient of Ivonne Andrew, NP. Lourine Alberico was referred to the pharmacist for assistance related to DM.    An unsuccessful telephone outreach was attempted today to contact the patient who was referred to the pharmacy team for assistance with medication management. Additional attempts will be made to contact the patient.   Penne Lash, RMA Care Guide [facility]  [place], [place] [address] Direct Dial: [phone] [email or web address]

## 2023-04-23 NOTE — Progress Notes (Signed)
Called pt and left a message. Lab results mail to pt home address. Gh

## 2023-05-01 ENCOUNTER — Other Ambulatory Visit: Payer: Self-pay

## 2023-05-07 ENCOUNTER — Other Ambulatory Visit: Payer: Medicare Other | Admitting: Pharmacist

## 2023-05-07 MED ORDER — METFORMIN HCL ER 500 MG PO TB24: 500.0000 mg | ORAL_TABLET | Freq: Every day | ORAL | 1 refills | Status: DC

## 2023-05-07 NOTE — Progress Notes (Signed)
05/07/2023 Name: Candace Diaz MRN: [id number] DOB: [birth year]  Chief Complaint  Patient presents with   Medication Management   Diabetes   Hypertension   Hyperlipidemia    Candace Diaz is a 67 y.o. year old female who presented for a telephone visit.   They were referred to the pharmacist by their PCP for assistance in managing diabetes, hypertension, and hyperlipidemia.   Subjective:  Care Team: Primary Care Provider: Ivonne Andrew, NP ; Next Scheduled Visit: 07/16/23  Medication Access/Adherence  Current Pharmacy:  [facility] [address] ([address]), [place] - [address] [place] ([place]) [place] [address] Phone: [phone] Fax: [phone]  [id number] - [place], [place] - [address] [place] [address] Phone: [phone] Fax: [phone]   Patient reports affordability concerns with their medications: No  Patient reports access/transportation concerns to their pharmacy: No  Patient reports adherence concerns with their medications:  No     Diabetes:  Current medications: Januvia 25 mg daily, prescribed Lantus 10 units daily, has not started yet  Medications tried in the past: metformin - no reason for discontinuation noted  Current glucose readings: 110s in the morning  Patient denies hypoglycemic s/sx including dizziness, shakiness, sweating. Patient denies hyperglycemic symptoms including polyuria, polydipsia, polyphagia, nocturia, neuropathy, blurred vision.   Hypertension:  Current medications: amlodipine 10 mg daily, lisinopril 20 mg daily, HCTZ 25 mg daily    Hyperlipidemia/ASCVD Risk Reduction  Current lipid lowering medications: simvastatin 10 mg daily   Objective:  Lab Results  Component Value Date   HGBA1C 7.6 (A) 04/16/2023    Lab Results  Component Value Date   CREATININE 0.83 04/16/2023   BUN 13 04/16/2023   NA 142 04/16/2023   K 3.3 (L) 04/16/2023   CL 99  04/16/2023   CO2 23 04/16/2023    Lab Results  Component Value Date   CHOL 168 01/15/2023   HDL 62 01/15/2023   LDLCALC 92 01/15/2023   TRIG 76 01/15/2023   CHOLHDL 2.7 01/15/2023    Medications Reviewed Today     Reviewed by Alden Hipp, RPH-CPP (Pharmacist) on 05/07/23 at 1338  Med List Status: <None>   Medication Order Taking? Sig Documenting Provider Last Dose Status Informant  acetaminophen (TYLENOL) 500 MG tablet [id number]  Take 1,000 mg by mouth every 6 (six) hours as needed for moderate pain or headache. [provider]  Active Family Member  albuterol [facility] HFA) 108 (90 Base) MCG/ACT inhaler [id number] Yes INHALE 2 PUFFS BY MOUTH FOUR TIMES A DAY AS NEEDED FOR SHORTNESS OF BREATH. Ivonne Andrew, NP Taking Active   amLODipine (NORVASC) 10 MG tablet [id number] Yes Take 1 tablet (10 mg total) by mouth daily. Ivonne Andrew, NP Taking Active   baclofen (LIORESAL) 10 MG tablet [id number] No Take 1 tablet (10 mg total) by mouth 3 (three) times daily. As needed for muscle spasm  Patient not taking: Reported on 05/07/2023   Ivonne Andrew, NP Not Taking Active   cholecalciferol (VITAMIN D3) 25 MCG (1000 UT) tablet [id number] Yes Take 2,000 Units by mouth daily. [provider] Taking Active Family Member  Cyanocobalamin (VITAMIN B 12 PO) [id number] Yes Take by mouth. [provider] Taking Active   EASY TOUCH INSULIN SYRINGE 31G X 5/16" 0.5 ML MISC [id number] No USE TO INJECT INSULIN EVERY DAY  Patient not taking: Reported on 05/07/2023   Henrietta Hoover, NP Not Taking Active Family Member  glucose blood (ACCU-CHEK GUIDE) test strip [id number] Yes Use as instructed Kallie Locks, FNP Taking Active   hydrochlorothiazide (HYDRODIURIL) 25 MG tablet [id number] Yes Take 1 tablet (25 mg total) by mouth daily. Ivonne Andrew, NP Taking Active   insulin glargine (LANTUS SOLOSTAR) 100 UNIT/ML Solostar Pen [id number] No Inject 10 units into the skin  daily at 10 pm  Patient not taking: Reported on 05/07/2023   Ivonne Andrew, NP Not Taking Active   Insulin Pen Needle (B-D ULTRAFINE III SHORT PEN) 31G X 8 MM MISC [id number]  Use with insulin injections once daily Ivonne Andrew, NP  Active   Lancets (ACCU-CHEK MULTICLIX) lancets [id number]  USE TO CHECK BLOOD SUGAR TWICE DAILY Kallie Locks, FNP  Active   lisinopril (ZESTRIL) 20 MG tablet [id number] Yes Take 1 tablet (20 mg total) by mouth daily. Ivonne Andrew, NP Taking Active   Multiple Vitamins-Minerals (MULTIVITAMIN WITH MINERALS) tablet [id number]  Take 2 tablets by mouth daily.  [provider]  Active Family Member  ondansetron (ZOFRAN) 4 MG tablet [id number]  TAKE 1 TABLET BY MOUTH EVERY 8 HOURS AS NEEDED FOR NAUSEA FOR VOMITING  Patient not taking: Reported on 04/16/2023   Kallie Locks, FNP  Active   polyethylene glycol (MIRALAX / GLYCOLAX) 17 g packet [id number] Yes Take 17 g by mouth daily as needed for mild constipation. Orion Crook I, NP Taking Active            Med Note Raeanne Gathers May 07, 2023  1:38 PM)    Polyvinyl Alcohol-Povidone PF (REFRESH) 1.4-0.6 % SOLN [id number]  Place 2 drops into both eyes daily as needed. Orion Crook I, NP  Active            Med Note Sherilyn Cooter, [facility]   Mon Apr 16, 2023 11:45 AM) prn  sennosides-docusate sodium (SENOKOT-S) 8.6-50 MG tablet [id number] Yes Take 2 tablets by mouth daily. Teryl Lucy, MD Taking Active            Med Note Clearance Coots, Mel Almond May 07, 2023  1:38 PM)    simvastatin (ZOCOR) 10 MG tablet [id number] Yes Take 1 tablet (10 mg total) by mouth every evening. Ivonne Andrew, NP Taking Active   sitaGLIPtin (JANUVIA) 25 MG tablet [id number] Yes Take 1 tablet (25 mg total) by mouth daily. Ivonne Andrew, NP Taking Active               Assessment/Plan:   Diabetes: - Currently uncontrolled - Reviewed long term cardiovascular and renal outcomes of uncontrolled blood sugar -  Reviewed goal A1c, goal fasting, and goal 2 hour post prandial glucose - Reviewed history. Metformin was previously discontinued because A1c was at goal, not because patient had tolerability issues. Resumption of metformin therapy is most cost effective and streamlined medication for patient. Discussed with PCP, she is in agreement. Start metformin XR 500 mg daily. Stop Januvia and Lantus. Patient verbalized understanding.  - Recommend to check glucose twice daily, fasting and 2 hour post prandial.    Hypertension: - Currently controlled - Recommend to continue current regimen at this time   Hyperlipidemia/ASCVD Risk Reduction: - Currently controlled.  - Recommend to continue current regimen. Monitor for myalgia aches and pains with simvastatin in combination with amlodipine    Follow Up Plan: phone call in 6 weeks  Catie Eppie Gibson, PharmD, BCACP, CPP [facility] [phone]

## 2023-06-18 ENCOUNTER — Other Ambulatory Visit: Payer: Medicare Other | Admitting: Pharmacist

## 2023-06-18 NOTE — Progress Notes (Addendum)
06/18/2023 Name: Candace Diaz MRN: [id number] DOB: January 30, 1956  Chief Complaint  Patient presents with   Medication Management   Diabetes    Candace Diaz is a 67 y.o. year old female who presented for a telephone visit.   They were referred to the pharmacist by their PCP for assistance in managing diabetes.    Subjective:  Care Team: Primary Care Provider: Ivonne Andrew, NP ; Next Scheduled Visit: 7/24  Medication Access/Adherence  Current Pharmacy:  [facility] [address] ([address]), [place] - [address] [place] ([place]) [place] [address] Phone: [phone] Fax: [phone]  [id number] - [place], [place] - [address] [place] [address] Phone: [phone] Fax: [phone]   Patient reports affordability concerns with their medications: No  Patient reports access/transportation concerns to their pharmacy: No  Patient reports adherence concerns with their medications:  No     Diabetes:  Current medications: metformin XR 500 mg daily Medications tried in the past: stopped Januvia when metformin   Denies any stomach upset, nausea, diarrhea with starting metformin.   Current glucose readings: fasting and afternoon: 100-110s; post supper: 120s  Patient denies hypoglycemic s/sx including dizziness, shakiness, sweating. Patient denies hyperglycemic symptoms including polyuria, polydipsia, polyphagia, nocturia, neuropathy, blurred vision.  Hypertension:  Current medications: lisinopril 20 mg daily, hydrochlorothiazide 25 mg daily, amlodipine 10 mg daily   Patient has a validated, automated, upper arm home BP cuff  Patient denies hypotensive s/sx including dizziness, lightheadedness.  Patient denies hypertensive symptoms including headache, chest pain, shortness of breath   Hyperlipidemia/ASCVD Risk Reduction  Current lipid lowering medications: simvastatin 10 mg daily Previously medications:  atorvastatin - itching   Objective:  Lab Results  Component Value Date   HGBA1C 7.6 (A) 04/16/2023    Lab Results  Component Value Date   CREATININE 0.83 04/16/2023   BUN 13 04/16/2023   NA 142 04/16/2023   K 3.3 (L) 04/16/2023   CL 99 04/16/2023   CO2 23 04/16/2023    Lab Results  Component Value Date   CHOL 168 01/15/2023   HDL 62 01/15/2023   LDLCALC 92 01/15/2023   TRIG 76 01/15/2023   CHOLHDL 2.7 01/15/2023    Medications Reviewed Today     Reviewed by Alden Hipp, RPH-CPP (Pharmacist) on 05/07/23 at 1339  Med List Status: <None>   Medication Order Taking? Sig Documenting Provider Last Dose Status Informant  acetaminophen (TYLENOL) 500 MG tablet [id number]  Take 1,000 mg by mouth every 6 (six) hours as needed for moderate pain or headache. [provider]  Active Family Member  albuterol [facility] HFA) 108 (90 Base) MCG/ACT inhaler [id number] Yes INHALE 2 PUFFS BY MOUTH FOUR TIMES A DAY AS NEEDED FOR SHORTNESS OF BREATH. Ivonne Andrew, NP Taking Active   amLODipine (NORVASC) 10 MG tablet [id number] Yes Take 1 tablet (10 mg total) by mouth daily. Ivonne Andrew, NP Taking Active   baclofen (LIORESAL) 10 MG tablet [id number] No Take 1 tablet (10 mg total) by mouth 3 (three) times daily. As needed for muscle spasm  Patient not taking: Reported on 05/07/2023   Ivonne Andrew, NP Not Taking Active   cholecalciferol (VITAMIN D3) 25 MCG (1000 UT) tablet [id number] Yes Take 2,000 Units by mouth daily. [provider] Taking Active Family Member  Cyanocobalamin (VITAMIN B 12 PO) [id number] Yes Take by mouth. [provider] Taking Active   EASY TOUCH INSULIN SYRINGE 31G X 5/16"  0.5 ML MISC [id number] No USE TO INJECT INSULIN EVERY DAY  Patient not taking: Reported on 05/07/2023   Henrietta Hoover, NP Not Taking Active Family Member  glucose blood (ACCU-CHEK GUIDE) test strip [id number] Yes Use as instructed Kallie Locks, FNP Taking Active    hydrochlorothiazide (HYDRODIURIL) 25 MG tablet [id number] Yes Take 1 tablet (25 mg total) by mouth daily. Ivonne Andrew, NP Taking Active   insulin glargine (LANTUS SOLOSTAR) 100 UNIT/ML Solostar Pen [id number] No Inject 10 units into the skin daily at 10 pm  Patient not taking: Reported on 05/07/2023   Ivonne Andrew, NP Not Taking Active   Insulin Pen Needle (B-D ULTRAFINE III SHORT PEN) 31G X 8 MM MISC [id number]  Use with insulin injections once daily Ivonne Andrew, NP  Active   Lancets (ACCU-CHEK MULTICLIX) lancets [id number]  USE TO CHECK BLOOD SUGAR TWICE DAILY Kallie Locks, FNP  Active   lisinopril (ZESTRIL) 20 MG tablet [id number] Yes Take 1 tablet (20 mg total) by mouth daily. Ivonne Andrew, NP Taking Active   Multiple Vitamins-Minerals (MULTIVITAMIN WITH MINERALS) tablet [id number] Yes Take 2 tablets by mouth daily.  [provider] Taking Active Family Member  ondansetron (ZOFRAN) 4 MG tablet [id number] No TAKE 1 TABLET BY MOUTH EVERY 8 HOURS AS NEEDED FOR NAUSEA FOR VOMITING  Patient not taking: Reported on 04/16/2023   Kallie Locks, FNP Not Taking Active   polyethylene glycol (MIRALAX / GLYCOLAX) 17 g packet [id number] Yes Take 17 g by mouth daily as needed for mild constipation. Orion Crook I, NP Taking Active            Med Note Raeanne Gathers May 07, 2023  1:38 PM)    Polyvinyl Alcohol-Povidone PF (REFRESH) 1.4-0.6 % SOLN [id number]  Place 2 drops into both eyes daily as needed. Orion Crook I, NP  Active            Med Note Sherilyn Cooter, [facility]   Mon Apr 16, 2023 11:45 AM) prn  sennosides-docusate sodium (SENOKOT-S) 8.6-50 MG tablet [id number] Yes Take 2 tablets by mouth daily. Teryl Lucy, MD Taking Active            Med Note Clearance Coots, Mel Almond May 07, 2023  1:38 PM)    simvastatin (ZOCOR) 10 MG tablet [id number] Yes Take 1 tablet (10 mg total) by mouth every evening. Ivonne Andrew, NP Taking Active   sitaGLIPtin (JANUVIA) 25  MG tablet [id number] Yes Take 1 tablet (25 mg total) by mouth daily. Ivonne Andrew, NP Taking Active               Assessment/Plan:   Diabetes: - Currently uncontrolled - Reviewed long term cardiovascular and renal outcomes of uncontrolled blood sugar - Reviewed goal A1c, goal fasting, and goal 2 hour post prandial glucose - Recommend to increase metformin XR 500 mg twice daily. Discussed with PCP, she is in agreement. Order placed.  - Recommend to check glucose periodically, fasting and 2 hour post prandial  Hypertension: - Currently controlled - Reviewed long term cardiovascular and renal outcomes of uncontrolled blood pressure - Reviewed appropriate blood pressure monitoring technique and reviewed goal blood pressure. Recommended to check home blood pressure and heart rate periodically - Recommend to continue current regimen  Hyperlipidemia/ASCVD Risk Reduction: - Currently uncontrolled, not at goal LDL <70, especially given prior CVA - Recommend to check cholesterol with labs next month, change to rosuvastatin 20 mg  daily if LDL not at goal   Follow Up Plan: phone call in 8 weeks  Catie TClearance Coots, PharmD, BCACP, CPP Clinical Pharmacist [facility] [phone]

## 2023-06-25 MED ORDER — METFORMIN HCL ER 500 MG PO TB24: 500.0000 mg | ORAL_TABLET | Freq: Two times a day (BID) | ORAL | 1 refills | Status: DC

## 2023-07-16 ENCOUNTER — Ambulatory Visit: Payer: Self-pay | Admitting: Nurse Practitioner

## 2023-07-17 ENCOUNTER — Other Ambulatory Visit: Payer: Self-pay | Admitting: Nurse Practitioner

## 2023-07-17 DIAGNOSIS — I10 Essential (primary) hypertension: Secondary | ICD-10-CM

## 2023-07-23 ENCOUNTER — Encounter: Payer: Self-pay | Admitting: Nurse Practitioner

## 2023-07-23 ENCOUNTER — Ambulatory Visit (INDEPENDENT_AMBULATORY_CARE_PROVIDER_SITE_OTHER): Payer: Medicare Other | Admitting: Nurse Practitioner

## 2023-07-23 VITALS — BP 114/73 | HR 76 | Temp 97.0°F | Wt 225.8 lb

## 2023-07-23 DIAGNOSIS — E1100 Type 2 diabetes mellitus with hyperosmolarity without nonketotic hyperglycemic-hyperosmolar coma (NKHHC): Secondary | ICD-10-CM

## 2023-07-23 LAB — POCT GLYCOSYLATED HEMOGLOBIN (HGB A1C): Hemoglobin A1C: 9.3 % — AB (ref 4.0–5.6)

## 2023-07-23 MED ORDER — METFORMIN HCL ER 500 MG PO TB24: 500.0000 mg | ORAL_TABLET | Freq: Two times a day (BID) | ORAL | 1 refills | Status: DC

## 2023-07-23 MED ORDER — METFORMIN HCL ER 500 MG PO TB24
1000.0000 mg | ORAL_TABLET | Freq: Two times a day (BID) | ORAL | 1 refills | Status: DC
Start: 2023-07-23 — End: 2023-11-20

## 2023-07-23 NOTE — Patient Instructions (Addendum)
1. Type 2 diabetes mellitus with hyperosmolarity without coma, unspecified whether long term insulin use (HCC)  - POCT glycosylated hemoglobin (Hb A1C) - CBC - Basic Metabolic Panel - metFORMIN (GLUCOPHAGE-XR) 500 MG 24 hr tablet; Take 2 tablets (1,000 mg total) by mouth 2 (two) times daily with a meal.  Dispense: 180 tablet; Refill: 1   Follow up:  Follow up in 3 months

## 2023-07-23 NOTE — Progress Notes (Signed)
@Patient  ID: Candace Diaz, female    DOB: 10/21/1956, 67 y.o.   MRN: [id number]  Chief Complaint  Patient presents with   Follow-up    A1c recheck     Referring provider: Ivonne Andrew, NP   HPI  Candace Diaz 67 y.o. 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 To the [facility] for reevaluation of chronic illness.    Diabetes Mellitus: Patient presents for follow up of diabetes. Symptoms: none. Patient denies hypoglycemia , increase appetite, and nausea.  Evaluation to date has been included: hemoglobin A1C. Treatment to date: no recent interventions. A1C in office today is higher at 9.3. Will consult pharmacy for diabetic medication management. Will increase metformin to 2 tablets in am and 2 tablets in PM.     Denies f/c/s, n/v/d, hemoptysis, PND, leg swelling Denies chest pain or edema      Allergies  Allergen Reactions   Aspirin Other (See Comments)    Internal bleeding   Penicillins Hives    Did it involve swelling of the face/tongue/throat, SOB, or low BP? Unknown Did it involve sudden or severe rash/hives, skin peeling, or any reaction on the inside of your mouth or nose? Unknown Did you need to seek medical attention at a hospital or doctor's office? Unknown When did it last happen?      years If all above answers are "NO", may proceed with cephalosporin use.    Immunization History  Administered Date(s) Administered   Influenza,inj,Quad PF,6+ Mos 12/03/2014, 09/09/2015, 10/30/2016, 08/31/2017, 09/09/2018, 10/07/2021   Pneumococcal Conjugate-13 01/24/2016   Pneumococcal Polysaccharide-23 08/14/2014   Tdap 08/14/2014    Past Medical History:  Diagnosis Date   Chronic pain of right knee    Diabetes mellitus    type 2    History of CVA (cerebrovascular accident)    History of  total knee arthroplasty, right 10/2019   Hypertension    Unsteady gait 07/2020    Tobacco History: Social History   Tobacco Use  Smoking Status Former   Current packs/day: 0.00   Types: Cigarettes   Quit date: 2000   Years since quitting: 24.5  Smokeless Tobacco Never   Counseling given: Not Answered   Outpatient Encounter Medications as of 07/23/2023  Medication Sig   albuterol (PROAIR HFA) 108 (90 Base) MCG/ACT inhaler INHALE 2 PUFFS BY MOUTH FOUR TIMES A DAY AS NEEDED FOR SHORTNESS OF BREATH.   amLODipine (NORVASC) 10 MG tablet Take 1 tablet (10 mg total) by mouth daily.   baclofen (LIORESAL) 10 MG tablet Take 1 tablet (10 mg total) by mouth 3 (three) times daily. As needed for muscle spasm   cholecalciferol (VITAMIN D3) 25 MCG (1000 UT) tablet Take 2,000 Units by mouth daily.   Cyanocobalamin (VITAMIN B 12 PO) Take by mouth.   glucose blood (ACCU-CHEK GUIDE) test strip Use as instructed   hydrochlorothiazide (HYDRODIURIL) 25 MG tablet Take 1 tablet (25 mg total) by mouth daily.   Insulin Pen Needle (B-D ULTRAFINE III SHORT PEN) 31G X 8 MM MISC Use with insulin injections once daily   Lancets (ACCU-CHEK MULTICLIX) lancets USE TO CHECK BLOOD SUGAR TWICE DAILY   lisinopril (ZESTRIL) 20 MG tablet Take 1 tablet by mouth once daily   Multiple Vitamins-Minerals (MULTIVITAMIN WITH MINERALS) tablet Take 2 tablets by mouth daily.    polyethylene glycol (MIRALAX /  GLYCOLAX) 17 g packet Take 17 g by mouth daily as needed for mild constipation.   Polyvinyl Alcohol-Povidone PF (REFRESH) 1.4-0.6 % SOLN Place 2 drops into both eyes daily as needed.   sennosides-docusate sodium (SENOKOT-S) 8.6-50 MG tablet Take 2 tablets by mouth daily.   simvastatin (ZOCOR) 10 MG tablet Take 1 tablet (10 mg total) by mouth every evening.   [DISCONTINUED] metFORMIN (GLUCOPHAGE-XR) 500 MG 24 hr tablet Take 1 tablet (500 mg total) by mouth 2 (two) times daily with a meal.   acetaminophen (TYLENOL) 500 MG tablet  Take 1,000 mg by mouth every 6 (six) hours as needed for moderate pain or headache.   metFORMIN (GLUCOPHAGE-XR) 500 MG 24 hr tablet Take 2 tablets (1,000 mg total) by mouth 2 (two) times daily with a meal.   ondansetron (ZOFRAN) 4 MG tablet TAKE 1 TABLET BY MOUTH EVERY 8 HOURS AS NEEDED FOR NAUSEA FOR VOMITING (Patient not taking: Reported on 04/16/2023)   [DISCONTINUED] metFORMIN (GLUCOPHAGE-XR) 500 MG 24 hr tablet Take 1 tablet (500 mg total) by mouth 2 (two) times daily with a meal.   No facility-administered encounter medications on file as of 07/23/2023.     Review of Systems  Review of Systems  Constitutional: Negative.   HENT: Negative.    Cardiovascular: Negative.   Gastrointestinal: Negative.   Allergic/Immunologic: Negative.   Neurological: Negative.   Psychiatric/Behavioral: Negative.         Physical Exam  BP 114/73   Pulse 76   Temp (!) 97 F (36.1 C)   Wt 225 lb 12.8 oz (102.4 kg)   SpO2 100%   BMI 34.33 kg/m   Wt Readings from Last 5 Encounters:  07/23/23 225 lb 12.8 oz (102.4 kg)  04/16/23 228 lb (103.4 kg)  01/15/23 230 lb (104.3 kg)  10/13/22 225 lb (102.1 kg)  07/13/22 218 lb (98.9 kg)     Physical Exam Vitals and nursing note reviewed.  Constitutional:      General: She is not in acute distress.    Appearance: She is well-developed.  Cardiovascular:     Rate and Rhythm: Normal rate and regular rhythm.  Pulmonary:     Effort: Pulmonary effort is normal.     Breath sounds: Normal breath sounds.  Neurological:     Mental Status: She is alert and oriented to person, place, and time.      Lab Results:  CBC    Component Value Date/Time   WBC 5.4 01/15/2023 1137   WBC 10.9 (H) 10/29/2019 0241   RBC 3.83 01/15/2023 1137   RBC 3.19 (L) 10/29/2019 0241   HGB 11.2 01/15/2023 1137   HCT 34.6 01/15/2023 1137   PLT 305 01/15/2023 1137   MCV 90 01/15/2023 1137   MCH 29.2 01/15/2023 1137   MCH 28.8 10/29/2019 0241   MCHC 32.4 01/15/2023 1137    MCHC 31.0 10/29/2019 0241   RDW 12.4 01/15/2023 1137   LYMPHSABS 2.1 01/15/2023 1137   MONOABS 480 05/03/2017 1025   EOSABS 0.1 01/15/2023 1137   BASOSABS 0.1 01/15/2023 1137    BMET    Component Value Date/Time   NA 142 04/16/2023 1207   K 3.3 (L) 04/16/2023 1207   CL 99 04/16/2023 1207   CO2 23 04/16/2023 1207   GLUCOSE 151 (H) 04/16/2023 1207   GLUCOSE 106 (H) 10/29/2019 0241   BUN 13 04/16/2023 1207   CREATININE 0.83 04/16/2023 1207   CREATININE 0.79 08/03/2017 1039   CALCIUM 9.5 04/16/2023 1207  GFRNONAA 82 01/26/2021 0947   GFRNONAA 82 08/03/2017 1039   GFRAA 95 01/26/2021 0947   GFRAA >89 08/03/2017 1039     Assessment & Plan:   T2DM (type 2 diabetes mellitus) (HCC) - POCT glycosylated hemoglobin (Hb A1C) - CBC - Basic Metabolic Panel - metFORMIN (GLUCOPHAGE-XR) 500 MG 24 hr tablet; Take 2 tablets (1,000 mg total) by mouth 2 (two) times daily with a meal.  Dispense: 180 tablet; Refill: 1   Follow up:  Follow up in 3 months     Ivonne Andrew, NP 07/23/2023

## 2023-07-23 NOTE — Assessment & Plan Note (Addendum)
-   POCT glycosylated hemoglobin (Hb A1C) - CBC - Basic Metabolic Panel - metFORMIN (GLUCOPHAGE-XR) 500 MG 24 hr tablet; Take 2 tablets (1,000 mg total) by mouth 2 (two) times daily with a meal.  Dispense: 180 tablet; Refill: 1   Follow up:  Follow up in 3 months

## 2023-07-25 ENCOUNTER — Other Ambulatory Visit: Payer: Self-pay | Admitting: Nurse Practitioner

## 2023-07-25 MED ORDER — POTASSIUM CHLORIDE ER 10 MEQ PO CPCR: 10.0000 meq | ORAL_CAPSULE | Freq: Two times a day (BID) | ORAL | 0 refills | Status: DC

## 2023-08-16 ENCOUNTER — Ambulatory Visit (INDEPENDENT_AMBULATORY_CARE_PROVIDER_SITE_OTHER): Payer: Medicare Other

## 2023-08-16 VITALS — Ht 69.0 in | Wt 225.0 lb

## 2023-08-16 DIAGNOSIS — Z0000 Encounter for general adult medical examination without abnormal findings: Secondary | ICD-10-CM

## 2023-08-16 DIAGNOSIS — E118 Type 2 diabetes mellitus with unspecified complications: Secondary | ICD-10-CM

## 2023-08-16 DIAGNOSIS — Z1231 Encounter for screening mammogram for malignant neoplasm of breast: Secondary | ICD-10-CM

## 2023-08-16 DIAGNOSIS — Z0100 Encounter for examination of eyes and vision without abnormal findings: Secondary | ICD-10-CM

## 2023-08-16 NOTE — Progress Notes (Signed)
Because this visit was a virtual/telehealth visit,  certain criteria was not obtained, such a blood pressure, CBG if patient is a diabetic, and timed get up and go. Any medications not marked as "taking" was not mentioned during the medication reconciliation part of the visit. Any vitals not documented were not able to be obtained due to this being a telehealth visit. Vitals that have been documented are verbally provided by the patient.  Patient was unable to self-report a recent blood pressure reading due to a lack of equipment at home via telehealth.  Subjective:   Candace Diaz is a 67 y.o. female who presents for an Initial Medicare Annual Wellness Visit.  Visit Complete: Virtual  I connected with  Candace Diaz on 08/16/23 by a audio enabled telemedicine application and verified that I am speaking with the correct person using two identifiers.  Patient Location: Home  Provider Location: Home Office  I discussed the limitations of evaluation and management by telemedicine. The patient expressed understanding and agreed to proceed.  Patient Medicare AWV questionnaire was completed by the patient on na; I have confirmed that all information answered by patient is correct and no changes since this date.  Review of Systems     Cardiac Risk Factors include: advanced age (>81men, >69 women);diabetes mellitus;dyslipidemia;hypertension;obesity (BMI >30kg/m2);sedentary lifestyle     Objective:    Today's Vitals   08/16/23 1255  Weight: 225 lb (102.1 kg)  Height: 5\' 9"  (1.753 m)   Body mass index is 33.23 kg/m.     08/16/2023   12:54 PM 12/02/2019   10:15 AM 10/28/2019   11:45 AM 10/22/2019    1:59 PM 09/18/2017    9:45 AM 01/20/2017    6:13 PM 10/30/2016   10:27 AM  Advanced Directives  Does Patient Have a Medical Advance Directive? No No No No No No No  Would patient like information on creating a medical advance directive? No - Patient declined No - Patient declined No -  Patient declined Yes (MAU/Ambulatory/Procedural Areas - Information given) No - Patient declined No - Patient declined No - patient declined information    Current Medications (verified) Outpatient Encounter Medications as of 08/16/2023  Medication Sig   amLODipine (NORVASC) 10 MG tablet Take 1 tablet (10 mg total) by mouth daily.   cholecalciferol (VITAMIN D3) 25 MCG (1000 UT) tablet Take 2,000 Units by mouth daily.   Cyanocobalamin (VITAMIN B 12 PO) Take by mouth.   hydrochlorothiazide (HYDRODIURIL) 25 MG tablet Take 1 tablet (25 mg total) by mouth daily.   lisinopril (ZESTRIL) 20 MG tablet Take 1 tablet by mouth once daily   metFORMIN (GLUCOPHAGE-XR) 500 MG 24 hr tablet Take 2 tablets (1,000 mg total) by mouth 2 (two) times daily with a meal.   Multiple Vitamins-Minerals (MULTIVITAMIN WITH MINERALS) tablet Take 2 tablets by mouth daily.    polyethylene glycol (MIRALAX / GLYCOLAX) 17 g packet Take 17 g by mouth daily as needed for mild constipation.   Polyvinyl Alcohol-Povidone PF (REFRESH) 1.4-0.6 % SOLN Place 2 drops into both eyes daily as needed.   potassium chloride (MICRO-K) 10 MEQ CR capsule Take 1 capsule (10 mEq total) by mouth 2 (two) times daily.   simvastatin (ZOCOR) 10 MG tablet Take 1 tablet (10 mg total) by mouth every evening.   acetaminophen (TYLENOL) 500 MG tablet Take 1,000 mg by mouth every 6 (six) hours as needed for moderate pain or headache.   albuterol (PROAIR HFA) 108 (90 Base) MCG/ACT inhaler  INHALE 2 PUFFS BY MOUTH FOUR TIMES A DAY AS NEEDED FOR SHORTNESS OF BREATH.   baclofen (LIORESAL) 10 MG tablet Take 1 tablet (10 mg total) by mouth 3 (three) times daily. As needed for muscle spasm   glucose blood (ACCU-CHEK GUIDE) test strip Use as instructed   Insulin Pen Needle (B-D ULTRAFINE III SHORT PEN) 31G X 8 MM MISC Use with insulin injections once daily   Lancets (ACCU-CHEK MULTICLIX) lancets USE TO CHECK BLOOD SUGAR TWICE DAILY   ondansetron (ZOFRAN) 4 MG tablet TAKE  1 TABLET BY MOUTH EVERY 8 HOURS AS NEEDED FOR NAUSEA FOR VOMITING (Patient not taking: Reported on 04/16/2023)   sennosides-docusate sodium (SENOKOT-S) 8.6-50 MG tablet Take 2 tablets by mouth daily.   No facility-administered encounter medications on file as of 08/16/2023.    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   Hypertension    Unsteady gait 07/2020   Past Surgical History:  Procedure Laterality Date   BREAST BIOPSY Left unsure   CATARACT EXTRACTION Right    surgery on ears   10 or 67 yo   " they did surgery to make me hear better but it just made it worse"    TONSILLECTOMY  childhood   TOTAL KNEE ARTHROPLASTY Right 10/28/2019   Procedure: TOTAL KNEE ARTHROPLASTY;  Surgeon: Teryl Lucy, MD;  Location: [facility];  Service: Orthopedics;  Laterality: Right;   Family History  Problem Relation Age of Onset   Diabetes Brother    Hypertension Brother    Diabetes Other    Hypertension Other    Social History   Socioeconomic History   Marital status: Single    Spouse name: Not on file   Number of children: Not on file   Years of education: Not on file   Highest education level: Not on file  Occupational History   Not on file  Tobacco Use   Smoking status: Former    Current packs/day: 0.00    Types: Cigarettes    Quit date: 2000    Years since quitting: 24.6   Smokeless tobacco: Never  Vaping Use   Vaping status: Never Used  Substance and Sexual Activity   Alcohol use: No   Drug use: No   Sexual activity: Not Currently  Other Topics Concern   Not on file  Social History Narrative   Not on file   Social Determinants of Health   Financial Resource Strain: Low Risk  (08/16/2023)   Overall Financial Resource Strain (CARDIA)    Difficulty of Paying  Living Expenses: Not hard at all  Food Insecurity: No Food Insecurity (08/16/2023)   Hunger Vital Sign    Worried About Running Out of Food in the Last Year: Never true    Ran Out of Food in the Last Year: Never true  Transportation Needs: No Transportation Needs (08/16/2023)   PRAPARE - Administrator, Civil Service (Medical): No    Lack of Transportation (Non-Medical): No  Physical Activity: Sufficiently Active (08/16/2023)   Exercise Vital Sign    Days of Exercise per Week: 7 days    Minutes of Exercise per Session: 30 min  Stress: No Stress Concern Present (08/16/2023)   [employer] of Occupational Health - Occupational Stress  Questionnaire    Feeling of Stress : Not at all  Social Connections: Moderately Integrated (08/16/2023)   Social Connection and Isolation Panel [NHANES]    Frequency of Communication with Friends and Family: More than three times a week    Frequency of Social Gatherings with Friends and Family: More than three times a week    Attends Religious Services: More than 4 times per year    Active Member of [employer] or Organizations: Yes    Attends Engineer, structural: More than 4 times per year    Marital Status: Never married    Tobacco Counseling Counseling given: Yes   Clinical Intake:  Pre-visit preparation completed: Yes  Pain : No/denies pain     BMI - recorded: 33.23 Nutritional Status: BMI > 30  Obese Nutritional Risks: None Diabetes: Yes CBG done?: No (telehealth visit. unable to obtain cbg) Did pt. bring in CBG monitor from home?: No  How often do you need to have someone help you when you read instructions, pamphlets, or other written materials from your doctor or pharmacy?: 1 - Never  Interpreter Needed?: No  Information entered by :: Abby Yuritzi Kamp, CMA   Activities of Daily Living    08/16/2023    1:17 PM  In your present state of health, do you have any difficulty performing the following activities:  Hearing?  0  Vision? 0  Difficulty concentrating or making decisions? 0  Walking or climbing stairs? 0  Dressing or bathing? 0  Doing errands, shopping? 0  Preparing Food and eating ? N  Using the Toilet? N  In the past six months, have you accidently leaked urine? N  Do you have problems with loss of bowel control? N  Managing your Medications? N  Managing your Finances? N  Housekeeping or managing your Housekeeping? N    Patient Care Team: Ivonne Andrew, NP as PCP - General (Pulmonary Disease) Alden Hipp, RPH-CPP (Pharmacist)  Indicate any recent Medical Services you may have received from other than [facility] providers in the past year (date may be approximate).     Assessment:   This is a routine wellness examination for Candace Diaz.  Hearing/Vision screen Hearing Screening - Comments:: Patient has difficulty hearing but is unable to wear hearing aids. She reads lips.  Vision Screening - Comments:: Referral placed today   Dietary issues and exercise activities discussed:     Goals Addressed             This Visit's Progress    Patient Stated       Remain active and independent        Depression Screen    08/16/2023    1:10 PM 04/16/2023   11:46 AM 07/13/2022   11:23 AM 10/07/2021   11:13 AM 07/27/2020    8:56 AM 07/28/2019   11:18 AM 09/09/2018   11:39 AM  PHQ 2/9 Scores  PHQ - 2 Score 0 0 0 0 0 0 0  Exception Documentation     Medical reason      Fall Risk    08/16/2023    1:17 PM 04/16/2023   11:46 AM 10/13/2022   11:24 AM 07/13/2022   11:21 AM 10/07/2021   11:13 AM  Fall Risk   Falls in the past year? 0 0 0 0 0  Number falls in past yr: 0 0 0 0 0  Injury with Fall? 0 0 0 0 0  Risk for fall due to : No Fall  Risks No Fall Risks No Fall Risks No Fall Risks   Follow up Falls prevention discussed Falls evaluation completed  Falls evaluation completed     MEDICARE RISK AT HOME: Medicare Risk at Home Any stairs in or around the home?: No If so, are there any  without handrails?: No Home free of loose throw rugs in walkways, pet beds, electrical cords, etc?: Yes Adequate lighting in your home to reduce risk of falls?: Yes Life alert?: No Use of a cane, walker or w/c?: Yes Grab bars in the bathroom?: Yes Shower chair or bench in shower?: Yes Elevated toilet seat or a handicapped toilet?: No  TIMED UP AND GO:  Was the test performed? No    Cognitive Function:        08/16/2023    1:07 PM  6CIT Screen  What Year? 0 points  What month? 0 points  What time? 0 points  Count back from 20 0 points  Months in reverse 0 points  Repeat phrase 0 points  Total Score 0 points    Immunizations Immunization History  Administered Date(s) Administered   Influenza,inj,Quad PF,6+ Mos 12/03/2014, 09/09/2015, 10/30/2016, 08/31/2017, 09/09/2018, 10/07/2021   Pneumococcal Conjugate-13 01/24/2016   Pneumococcal Polysaccharide-23 08/14/2014   Tdap 08/14/2014    TDAP status: Up to date  Flu Vaccine status: Due, Education has been provided regarding the importance of this vaccine. Advised may receive this vaccine at local pharmacy or Health Dept. Aware to provide a copy of the vaccination record if obtained from local pharmacy or Health Dept. Verbalized acceptance and understanding.  Pneumococcal vaccine status: Due, Education has been provided regarding the importance of this vaccine. Advised may receive this vaccine at local pharmacy or Health Dept. Aware to provide a copy of the vaccination record if obtained from local pharmacy or Health Dept. Verbalized acceptance and understanding.  Covid-19 vaccine status: Information provided on how to obtain vaccines.   Qualifies for Shingles Vaccine? Yes   Zostavax completed No   Shingrix Completed?: No.    Education has been provided regarding the importance of this vaccine. Patient has been advised to call insurance company to determine out of pocket expense if they have not yet received this vaccine.  Advised may also receive vaccine at local pharmacy or Health Dept. Verbalized acceptance and understanding.  Screening Tests Health Maintenance  Topic Date Due   Medicare Annual Wellness (AWV)  Never done   Zoster Vaccines- Shingrix (1 of 2) Never done   OPHTHALMOLOGY EXAM  04/06/2022   COVID-19 Vaccine (1 - 2023-24 season) Never done   MAMMOGRAM  10/21/2022   INFLUENZA VACCINE  07/26/2023   Pneumonia Vaccine 36+ Years old (3 of 3 - PPSV23 or PCV20) 12/18/2023 (Originally 06/09/2022)   Diabetic kidney evaluation - Urine ACR  01/16/2024   HEMOGLOBIN A1C  01/23/2024   FOOT EXAM  02/15/2024   Diabetic kidney evaluation - eGFR measurement  07/22/2024   DTaP/Tdap/Td (2 - Td or Tdap) 08/14/2024   Colonoscopy  03/01/2025   DEXA SCAN  Completed   Hepatitis C Screening  Completed   HPV VACCINES  Aged Out    Health Maintenance  Health Maintenance Due  Topic Date Due   Medicare Annual Wellness (AWV)  Never done   Zoster Vaccines- Shingrix (1 of 2) Never done   OPHTHALMOLOGY EXAM  04/06/2022   COVID-19 Vaccine (1 - 2023-24 season) Never done   MAMMOGRAM  10/21/2022   INFLUENZA VACCINE  07/26/2023    Colorectal cancer screening: Type  of screening: Colonoscopy. Completed 03/02/2015. Repeat every 10 years  Mammogram status: Ordered 08/16/2023. Pt provided with contact info and advised to call to schedule appt.   Bone Density status: Ordered 04/16/23. Pt provided with contact info and advised to call to schedule appt.  Lung Cancer Screening: (Low Dose CT Chest recommended if Age 35-80 years, 20 pack-year currently smoking OR have quit w/in 15years.) does not qualify.   Lung Cancer Screening Referral: na  Additional Screening:  Hepatitis C Screening: does not qualify; Completed 01/24/2016  Vision Screening: Recommended annual ophthalmology exams for early detection of glaucoma and other disorders of the eye. Is the patient up to date with their annual eye exam?  No  Who is the provider or  what is the name of the office in which the patient attends annual eye exams? Referral placed 08/16/2023 If pt is not established with a provider, would they like to be referred to a provider to establish care? Yes .   Dental Screening: Recommended annual dental exams for proper oral hygiene  Diabetic Foot Exam: Diabetic Foot Exam: Completed 02/14/2023  Community Resource Referral / Chronic Care Management: CRR required this visit?  No   CCM required this visit?  No     Plan:     I have personally reviewed and noted the following in the patient's chart:   Medical and social history Use of alcohol, tobacco or illicit drugs  Current medications and supplements including opioid prescriptions. Patient is not currently taking opioid prescriptions. Functional ability and status Nutritional status Physical activity Advanced directives List of other physicians Hospitalizations, surgeries, and ER visits in previous 12 months Vitals Screenings to include cognitive, depression, and falls Referrals and appointments  In addition, I have reviewed and discussed with patient certain preventive protocols, quality metrics, and best practice recommendations. A written personalized care plan for preventive services as well as general preventive health recommendations were provided to patient.     Jordan Hawks Jahayra Mazo, CMA   08/16/2023   After Visit Summary: (Mail) Due to this being a telephonic visit, the after visit summary with patients personalized plan was offered to patient via mail   Nurse Notes:

## 2023-08-16 NOTE — Patient Instructions (Signed)
Candace Diaz , Thank you for taking time to come for your Medicare Wellness Visit. I appreciate your ongoing commitment to your health goals. Please review the following plan we discussed and let me know if I can assist you in the future.   Referrals/Orders/Follow-Ups/Clinician Recommendations:  You have been referred to [facility] for a complete eye exam. If you haven't heard from them in a few days, please call them to schedule your appointment.  [facility] [address] [place] [address] Phone: [phone]  You have an order for:  []   2D Mammogram  [x]   3D Mammogram  []   Bone Density     Please call for appointment:   [facility] [address] [place], [place] [address] [phone]  Make sure to wear two-piece clothing.  No lotions powders or deodorants the day of the appointment Make sure to bring picture ID and insurance card.  Bring list of medications you are currently taking including any supplements.   Schedule your [place] screening mammogram through MyChart!   Log into your MyChart account.  Go to 'Visit' (or 'Appointments' if on mobile App) --> Schedule an Appointment  Under 'Select a Reason for Visit' choose the Mammogram Screening option.  Complete the pre-visit questions and select the time and place that best fits your schedule.    This is a list of the screening recommended for you and due dates:  Health Maintenance  Topic Date Due   Zoster (Shingles) Vaccine (1 of 2) Never done   Eye exam for diabetics  04/06/2022   COVID-19 Vaccine (1 - 2023-24 season) Never done   Mammogram  10/21/2022   Flu Shot  07/26/2023   Pneumonia Vaccine (3 of 3 - PPSV23 or PCV20) 12/18/2023*   Yearly kidney health urinalysis for diabetes  01/16/2024   Hemoglobin A1C  01/23/2024   Complete foot exam   02/15/2024   Yearly kidney function blood test for diabetes  07/22/2024   DTaP/Tdap/Td vaccine (2 - Td or Tdap) 08/14/2024    Medicare Annual Wellness Visit  08/15/2024   Colon Cancer Screening  03/01/2025   DEXA scan (bone density measurement)  Completed   Hepatitis C Screening  Completed   HPV Vaccine  Aged Out  *Topic was postponed. The date shown is not the original due date.    Advanced directives: (Declined) Advance directive discussed with you today. Even though you declined this today, please call our office should you change your mind, and we can give you the proper paperwork for you to fill out.  Next Medicare Annual Wellness Visit scheduled for next year: Yes August 21, 2024 at 10:30 am telephone visit.   Preventive Care 69 Years and Older, Female Preventive care refers to lifestyle choices and visits with your health care provider that can promote health and wellness. Preventive care visits are also called wellness exams. What can I expect for my preventive care visit? Counseling Your health care provider may ask you questions about your: Medical history, including: Past medical problems. Family medical history. Pregnancy and menstrual history. History of falls. Current health, including: Memory and ability to understand (cognition). Emotional well-being. Home life and relationship well-being. Sexual activity and sexual health. Lifestyle, including: Alcohol, nicotine or tobacco, and drug use. Access to firearms. Diet, exercise, and sleep habits. Work and work Astronomer. Sunscreen use. Safety issues such as seatbelt and bike helmet use. Physical exam Your health care provider will check your: Height  and weight. These may be used to calculate your BMI (body mass index). BMI is a measurement that tells if you are at a healthy weight. Waist circumference. This measures the distance around your waistline. This measurement also tells if you are at a healthy weight and may help predict your risk of certain diseases, such as type 2 diabetes and high blood pressure. Heart rate and blood  pressure. Body temperature. Skin for abnormal spots. What immunizations do I need?  Vaccines are usually given at various ages, according to a schedule. Your health care provider will recommend vaccines for you based on your age, medical history, and lifestyle or other factors, such as travel or where you work. What tests do I need? Screening Your health care provider may recommend screening tests for certain conditions. This may include: Lipid and cholesterol levels. Hepatitis C test. Hepatitis B test. HIV (human immunodeficiency virus) test. STI (sexually transmitted infection) testing, if you are at risk. Lung cancer screening. Colorectal cancer screening. Diabetes screening. This is done by checking your blood sugar (glucose) after you have not eaten for a while (fasting). Mammogram. Talk with your health care provider about how often you should have regular mammograms. BRCA-related cancer screening. This may be done if you have a family history of breast, ovarian, tubal, or peritoneal cancers. Bone density scan. This is done to screen for osteoporosis. Talk with your health care provider about your test results, treatment options, and if necessary, the need for more tests. Follow these instructions at home: Eating and drinking  Eat a diet that includes fresh fruits and vegetables, whole grains, lean protein, and low-fat dairy products. Limit your intake of foods with high amounts of sugar, saturated fats, and salt. Take vitamin and mineral supplements as recommended by your health care provider. Do not drink alcohol if your health care provider tells you not to drink. If you drink alcohol: Limit how much you have to 0-1 drink a day. Know how much alcohol is in your drink. In the [place] one drink equals one 12 oz bottle of beer (355 mL), one 5 oz glass of wine (148 mL), or one 1 oz glass of hard liquor (44 mL). Lifestyle Brush your teeth every morning and night with fluoride  toothpaste. Floss one time each day. Exercise for at least 30 minutes 5 or more days each week. Do not use any products that contain nicotine or tobacco. These products include cigarettes, chewing tobacco, and vaping devices, such as e-cigarettes. If you need help quitting, ask your health care provider. Do not use drugs. If you are sexually active, practice safe sex. Use a condom or other form of protection in order to prevent STIs. Take aspirin only as told by your health care provider. Make sure that you understand how much to take and what form to take. Work with your health care provider to find out whether it is safe and beneficial for you to take aspirin daily. Ask your health care provider if you need to take a cholesterol-lowering medicine (statin). Find healthy ways to manage stress, such as: Meditation, yoga, or listening to music. Journaling. Talking to a trusted person. Spending time with friends and family. Minimize exposure to UV radiation to reduce your risk of skin cancer. Safety Always wear your seat belt while driving or riding in a vehicle. Do not drive: If you have been drinking alcohol. Do not ride with someone who has been drinking. When you are tired or distracted. While texting. If you  have been using any mind-altering substances or drugs. Wear a helmet and other protective equipment during sports activities. If you have firearms in your house, make sure you follow all gun safety procedures. What's next? Visit your health care provider once a year for an annual wellness visit. Ask your health care provider how often you should have your eyes and teeth checked. Stay up to date on all vaccines. This information is not intended to replace advice given to you by your health care provider. Make sure you discuss any questions you have with your health care provider. Document Revised: 06/08/2021 Document Reviewed: 06/08/2021 Elsevier Patient Education  2024 [employer]. Understanding Your Risk for Falls Millions of people have serious injuries from falls each year. It is important to understand your risk of falling. Talk with your health care provider about your risk and what you can do to lower it. If you do have a serious fall, make sure to tell your provider. Falling once raises your risk of falling again. How can falls affect me? Serious injuries from falls are common. These include: Broken bones, such as hip fractures. Head injuries, such as traumatic brain injuries (TBI) or concussions. A fear of falling can cause you to avoid activities and stay at home. This can make your muscles weaker and raise your risk for a fall. What can increase my risk? There are a number of risk factors that increase your risk for falling. The more risk factors you have, the higher your risk of falling. Serious injuries from a fall happen most often to people who are older than 66 years old. Teenagers and young adults ages 46-29 are also at higher risk. Common risk factors include: Weakness in the lower body. Being generally weak or confused due to long-term (chronic) illness. Dizziness or balance problems. Poor vision. Medicines that cause dizziness or drowsiness. These may include: Medicines for your blood pressure, heart, anxiety, insomnia, or swelling (edema). Pain medicines. Muscle relaxants. Other risk factors include: Drinking alcohol. Having had a fall in the past. Having foot pain or wearing improper footwear. Working at a dangerous job. Having any of the following in your home: Tripping hazards, such as floor clutter or loose rugs. Poor lighting. Pets. Having dementia or memory loss. What actions can I take to lower my risk of falling?     Physical activity Stay physically fit. Do strength and balance exercises. Consider taking a regular class to build strength and balance. Yoga and tai chi are good options. Vision Have your eyes checked every  year and your prescription for glasses or contacts updated as needed. Shoes and walking aids Wear non-skid shoes. Wear shoes that have rubber soles and low heels. Do not wear high heels. Do not walk around the house in socks or slippers. Use a cane or walker as told by your provider. Home safety Attach secure railings on both sides of your stairs. Install grab bars for your bathtub, shower, and toilet. Use a non-skid mat in your bathtub or shower. Attach bath mats securely with double-sided, non-slip rug tape. Use good lighting in all rooms. Keep a flashlight near your bed. Make sure there is a clear path from your bed to the bathroom. Use night-lights. Do not use throw rugs. Make sure all carpeting is taped or tacked down securely. Remove all clutter from walkways and stairways, including extension cords. Repair uneven or broken steps and floors. Avoid walking on icy or slippery surfaces. Walk on the grass instead of on  icy or slick sidewalks. Use ice melter to get rid of ice on walkways in the winter. Use a cordless phone. Questions to ask your health care provider Can you help me check my risk for a fall? Do any of my medicines make me more likely to fall? Should I take a vitamin D supplement? What exercises can I do to improve my strength and balance? Should I make an appointment to have my vision checked? Do I need a bone density test to check for weak bones (osteoporosis)? Would it help to use a cane or a walker? Where to find more information Centers for Disease Control and Prevention, STEADI: TonerPromos.no Community-Based Fall Prevention Programs: TonerPromos.no [employer] on Aging: BaseRingTones.pl Contact a health care provider if: You fall at home. You are afraid of falling at home. You feel weak, drowsy, or dizzy. This information is not intended to replace advice given to you by your health care provider. Make sure you discuss any questions you have with your health care  provider. Document Revised: 08/14/2022 Document Reviewed: 08/14/2022 Elsevier Patient Education  2024 [employer].

## 2023-08-20 ENCOUNTER — Other Ambulatory Visit: Payer: Medicare Other | Admitting: Pharmacist

## 2023-08-20 MED ORDER — OZEMPIC (0.25 OR 0.5 MG/DOSE) 2 MG/3ML ~~LOC~~ SOPN: PEN_INJECTOR | SUBCUTANEOUS | 2 refills | Status: DC

## 2023-08-20 NOTE — Progress Notes (Signed)
08/20/2023 Name: Candace Diaz MRN: [id number] DOB: 1955-12-30  Chief Complaint  Patient presents with   Diabetes    [name] Drilling is a 67 y.o. year old female who presented for a telephone visit.   They were referred to the pharmacist by their PCP for assistance in managing diabetes, hypertension, and hyperlipidemia.    Subjective:  Care Team: Primary Care Provider: Ivonne Andrew, NP ; Next Scheduled Visit: 10/24/23  Medication Access/Adherence  Current Pharmacy:  [facility] [address]), [place] - [address] [place]) [place] [address] Phone: [phone] Fax: [phone]  [id number] - [place], [place] - [address] [place] [address] Phone: [phone] Fax: [phone]   Patient reports affordability concerns with their medications: No  Patient reports access/transportation concerns to their pharmacy: No  Patient reports adherence concerns with their medications:  No    Diabetes:  Current medications: metformin XR 1000 mg in the morning (2 tabs) 500 mg (1 tab) in the evening Medications tried in the past: glipizide, insulin glargine (Lantus), sitagliptin (Januvia)  Current glucose readings: FBG 110 yesterday, highest 150 mg. After eating 170s, 188. Denies BG over 200.  Using glucose meter; testing 2 times daily (fasting, before bed)  Patient denies hypoglycemic s/sx including dizziness, shakiness, sweating. Patient denies hyperglycemic symptoms including polyuria, polydipsia, polyphagia, nocturia, neuropathy, blurred vision.  Current meal patterns: Eats 3 meals a day. Likes to eat salad and baked foods (Diaz.e. baked chicken). Does have fried foods occasionally.   Current medication access support: Medicaid  Hyperlipidemia/ASCVD Risk Reduction  Current lipid lowering medications: simvastatin 10 mg daily Medications tried in the past: pravastatin 40 mg daily, atorvastatin 10 mg daily  (itching)   Pt has two more months left of simvastatin 10  Antiplatelet regimen: hx of internal bleeding on aspirin  ASCVD History: CVA  Hypertension:  Current medications: lisinopril 20 mg, hydrochlorothiazide 25 mg, amlodipine 10 mg  Patient denies hypotensive s/sx including dizziness, lightheadedness.  Patient denies hypertensive symptoms including headache, chest pain, shortness of breath  Shortness of Breath:  Current medications: Albuterol HFA 2 puffs PRN SOB  Medications tried in the past: has never been on a maintenance inhaler - no asthma or COPD documented in chart. No evidence of recent exacerbation.   Pt has not smoked cigarettes in >20 years.   Reports shortness of breath when she runs errands, walks around the store- this is when she needs her albuterol.    Objective:  Lab Results  Component Value Date   HGBA1C 9.3 (A) 07/23/2023    Lab Results  Component Value Date   CREATININE 0.80 07/23/2023   BUN 9 07/23/2023   NA 137 07/23/2023   K 3.1 (L) 07/23/2023   CL 95 (L) 07/23/2023   CO2 25 07/23/2023    Lab Results  Component Value Date   CHOL 168 01/15/2023   HDL 62 01/15/2023   LDLCALC 92 01/15/2023   TRIG 76 01/15/2023   CHOLHDL 2.7 01/15/2023    Medications Reviewed Today     Reviewed by Candace Diaz, RPH (Pharmacist) on 08/20/23 at 1436  Med List Status: <None>   Medication Order Taking? Sig Documenting Provider Last Dose Status Informant  acetaminophen (TYLENOL) 500 MG tablet [id number] Yes Take 1,000 mg by mouth every 6 (six) hours as needed for moderate pain or headache. [provider] Taking Active Family Member           Med  Note Candace Diaz   Mon Aug 20, 2023  2:18 PM) PRN headaches  albuterol [facility] HFA) 108 (90 Base) MCG/ACT inhaler [id number] Yes INHALE 2 PUFFS BY MOUTH FOUR TIMES A DAY AS NEEDED FOR SHORTNESS OF BREATH. Candace Andrew, NP Taking Active            Med Note Candace Diaz   Mon Aug 20, 2023  2:15 PM)  Using ~2 times/week  amLODipine (NORVASC) 10 MG tablet [id number] Yes Take 1 tablet (10 mg total) by mouth daily. Candace Andrew, NP Taking Active   baclofen (LIORESAL) 10 MG tablet [id number]  Take 1 tablet (10 mg total) by mouth 3 (three) times daily. As needed for muscle spasm Candace Andrew, NP  Active   cholecalciferol (VITAMIN D3) 25 MCG (1000 UT) tablet [id number] Yes Take 2,000 Units by mouth daily. [provider] Taking Active Family Member  Cyanocobalamin (VITAMIN B 12 PO) [id number] Yes Take by mouth. [provider] Taking Active   glucose blood (ACCU-CHEK GUIDE) test strip [id number]  Use as instructed Candace Locks, FNP  Active   hydrochlorothiazide (HYDRODIURIL) 25 MG tablet [id number] Yes Take 1 tablet (25 mg total) by mouth daily. Candace Andrew, NP Taking Active   Insulin Pen Needle (B-D ULTRAFINE III SHORT PEN) 31G X 8 MM MISC [id number]  Use with insulin injections once daily Candace Andrew, NP  Active   Lancets (ACCU-CHEK MULTICLIX) lancets [id number]  USE TO CHECK BLOOD SUGAR TWICE DAILY Candace Locks, FNP  Active   lisinopril (ZESTRIL) 20 MG tablet [id number] Yes Take 1 tablet by mouth once daily Candace Andrew, NP Taking Active   metFORMIN (GLUCOPHAGE-XR) 500 MG 24 hr tablet [id number] Yes Take 2 tablets (1,000 mg total) by mouth 2 (two) times daily with a meal. Candace Andrew, NP Taking Active   Multiple Vitamins-Minerals (MULTIVITAMIN WITH MINERALS) tablet [id number] Yes Take 2 tablets by mouth daily.  [provider] Taking Active Family Member  ondansetron (ZOFRAN) 4 MG tablet [id number]  TAKE 1 TABLET BY MOUTH EVERY 8 HOURS AS NEEDED FOR NAUSEA FOR VOMITING  Patient not taking: Reported on 04/16/2023   Candace Locks, FNP  Active   polyethylene glycol (MIRALAX / GLYCOLAX) 17 g packet [id number] Yes Take 17 g by mouth daily as needed for mild constipation. Candace Crook I, NP Taking Active            Med Note Candace Diaz May 07, 2023  1:38 PM)    Polyvinyl Alcohol-Povidone PF (REFRESH) 1.4-0.6 % SOLN [id number] Yes Place 2 drops into both eyes daily as needed. Candace Crook I, NP Taking Active            Med Note Candace Diaz, [facility]   Mon Apr 16, 2023 11:45 AM) prn  potassium chloride (MICRO-K) 10 MEQ CR capsule [id number]  Take 1 capsule (10 mEq total) by mouth 2 (two) times daily. Candace Andrew, NP  Active   sennosides-docusate sodium (SENOKOT-S) 8.6-50 MG tablet [id number] Yes Take 2 tablets by mouth daily. Teryl Lucy, MD Taking Active            Med Note Clearance Coots, Mel Almond May 07, 2023  1:38 PM)    simvastatin (ZOCOR) 10 MG tablet [id number] Yes Take 1 tablet (10 mg total) by mouth every evening. Candace Andrew, NP Taking Active             Assessment/Plan:  Diabetes: - Currently uncontrolled with recent A1c 9.3% above goal <7%, and increased from 7.6%. Pt is not aware of dietary changes which may have contributed to elevated BG. FBG are controlled per pt report. Pt is a great candidate for GLP-1RA therapy given metabolic syndrome with BMI of 82.9.  - Reviewed long term cardiovascular and renal outcomes of uncontrolled blood sugar - Reviewed goal A1c, goal fasting, and goal 2 hour post prandial glucose - Reviewed dietary modifications including maximizing intake of green leafy vegetables, minimizing carbohydrate heavy foods - Reviewed lifestyle modifications including: staying hydrated with water throughout the day - Recommend to start taking metformin 500 mg XR 2 tabs BID as prescribed.  - Recommend to initiate Ozempic (semaglutide) 0.25 mg weekly. Will f/u after 4 weeks and assess for tolerability/increase to 0.5 mg weekly. PCP in agreement with plan.  - Pt denies hx of pancreatitis. No personal or family hx of MTC or MEN.   - Educated pt on potential AE of GLP-1RA. Educated pt to stop eating when she feels full. Notify provider if she experiences severe GI upset.  - Recommend to  check glucose twice daily (fasting and 2 hr PPG.    Hyperlipidemia/ASCVD Risk Reduction: - Currently uncontrolled with LDL 92 mg/dL above goal <56 mg/dL given hx of CVA and co-morbidities of HTN and T2DM. Pt would like to finish current supply of medication before transitioning to high intensity statin. Does not appear that she has tried rosuvastatin in the past.  - Recommend to finish supply of simvastatin 10 mg tablets, and plan to send new Rx for rosuvastatin 20 mg at follow-up appt   Hypertension: - Currently controlled below goal <130/80 mmHg. - Recommend to continue current regimen.   Medication Management: - Pt experiencing shortness of breath when walking short distances (within a store). Does not appear that pt has ever been evaluated for COPD. Former smoker (quit >20 years ago). Will coordinate with PCP to f/u with patient and evaluate if she may benefit from a maintenance inhaler.   Follow Up Plan: Pharmacist telephone 09/18/23, PCP 10/24/23  Nils Pyle, PharmD PGY1 Pharmacy Resident

## 2023-08-20 NOTE — Progress Notes (Signed)
 I have reviewed the pharmacist's encounter and agree with their documentation.   Catie Eppie Gibson, PharmD, BCACP, CPP [facility] [phone]

## 2023-08-23 ENCOUNTER — Ambulatory Visit
Admission: RE | Admit: 2023-08-23 | Discharge: 2023-08-23 | Disposition: A | Discharge status: Home / Self Care | Payer: Medicare Other | Source: Ambulatory Visit | Attending: Nurse Practitioner | Admitting: Nurse Practitioner

## 2023-08-23 DIAGNOSIS — Z0000 Encounter for general adult medical examination without abnormal findings: Secondary | ICD-10-CM

## 2023-08-23 DIAGNOSIS — Z1231 Encounter for screening mammogram for malignant neoplasm of breast: Secondary | ICD-10-CM

## 2023-09-17 NOTE — Progress Notes (Unsigned)
09/17/2023 Name: Candace Diaz MRN: [id number] DOB: [birth year]  No chief complaint on file.   Candace Diaz is a 67 y.o. year old female who presented for a telephone visit.   They were referred to the pharmacist by their PCP for assistance in managing diabetes, hypertension, and hyperlipidemia.    Subjective: Patient reports feeling well today. Attempted to discuss medications, but patient is a poor historian. Difficult to understand what she has actually been taking at home.  Care Team: Primary Care Provider: Ivonne Andrew, NP ; Next Scheduled Visit: 10/24/23  Medication Access/Adherence  Current Pharmacy:  [facility] [address] ([address]), [place] - [address] [place] ([place]) [place] [address] Phone: [phone] Fax: [phone]  [id number] - [place], [place] - [address]  [address] Phone: [phone] Fax: [phone]   Patient reports affordability concerns with their medications: No  Patient reports access/transportation concerns to their pharmacy: No  Patient reports adherence concerns with their medications:  No     Diabetes:  Current medications: Ozempic 0.25 mg vs. 0.5 mg weekly (Tuesdays), metformin XR 1000 mg twice daily Medications tried in the past: glipizide, insulin glargine (Lantus), sitagliptin (Januvia)   Fill hx shows Ozempic was picked up on 8/27. Patient initially reported taking Ozempic 0.25 mg weekly on Tuesdays then later informed me she has been taking Ozempic 0.5 mg weekly since she picked it up. She was unable to confirm how many doses of Ozempic she has had so far, but notes she is tolerating Ozempic well. Unable to confirm if she is out of Ozempic or if she has taken dose due today. Has noticed some decreased appetite, but no nausea, diarrhea, or worsened constipation.  Current glucose readings: FBG 110-140, Denies BG<70 Using glucometer; testing 2 times  daily  Patient denies hypoglycemic s/sx including dizziness, shakiness, sweating. Patient denies hyperglycemic symptoms including polyuria, polydipsia, polyphagia, nocturia, neuropathy, blurred vision.  Current meal patterns: Eats 3 meals/day - Eats salad, baked chicken, vegetables - Cut out fried food - Drinks lemonade and water  Current medication access support: Medicare + Medicaid  Hypertension:  Current medications: hydrochlorothiazide 25 mg daily, lisinopril 20 mg daily, amlodipine 10 mg daily   Hyperlipidemia/ASCVD Risk Reduction  Current lipid lowering medications: simvastatin 10 mg daily Medications tried in the past: pravastatin 40 mg daily, atorvastatin 10 mg daily (itching)   Antiplatelet regimen: hx of internal bleeding on aspirin  ASCVD History: CVA  Risk Factors: T2DM, HTN   Objective:  Lab Results  Component Value Date   HGBA1C 9.3 (A) 07/23/2023    Lab Results  Component Value Date   CREATININE 0.80 07/23/2023   BUN 9 07/23/2023   NA 137 07/23/2023   K 3.1 (L) 07/23/2023   CL 95 (L) 07/23/2023   CO2 25 07/23/2023    Lab Results  Component Value Date   CHOL 168 01/15/2023   HDL 62 01/15/2023   LDLCALC 92 01/15/2023   TRIG 76 01/15/2023   CHOLHDL 2.7 01/15/2023    Medications Reviewed Today   Medications were not reviewed in this encounter       Assessment/Plan:   Diabetes: - Currently uncontrolled based on last A1c 9.3%, above goal <7%. Appears control may be improving based on patient reported FBG close to goal today. - Reviewed long term cardiovascular and renal outcomes of uncontrolled blood sugar - Reviewed goal A1c, goal fasting, and goal 2 hour post prandial glucose - Recommend to  continue Ozempic given she is tolerating well. Was unable to confirm what dose she has been taking or how many doses she has taken. Also, unable to confirm if she is out of Ozempic or if she has taken her dose due today. Recommended in person visit next  week to confirm dose of medications in order to make adjustments. Ideally will be able to increase Ozempic. - Continue metformin XR 1000 mg twice daily - Instructed patient to bring all medications to in person visit next week - Recommend to check fasting glucose once daily in the morning - Updated A1c due at next PCP visit on 10/24/23     Hypertension: - Currently controlled - Has a hx of hypokalemia with K of 3.1 on most recent BMP in July. She is currently taking hydrochlorothiazide 25 mg daily. Reports she has been taking potassium 10 mEq twice daily, but this does not correlate with fill hx. Would recommend checking BMP next week and consider discontinuing hydrochlorothiazide and maximizing dose of lisinopril. Could consider addition of spironolactone in the future if needed for additional BP control.     Hyperlipidemia/ASCVD Risk Reduction: - Currently uncontrolled based on last LDL 92, above goal <55 mg/dl given hx of CVA with risk factors T2DM and HTN - Recommend to switch simvastatin to high intensity rosuvastatin 40 mg daily. Will discuss with patient at in person visit next week.   Follow Up Plan: in person visit scheduled for next week on 10/1  Jarrett Ables, PharmD PGY-1 Pharmacy Resident

## 2023-09-18 ENCOUNTER — Other Ambulatory Visit: Payer: Medicare Other | Admitting: Pharmacist

## 2023-09-25 ENCOUNTER — Ambulatory Visit (INDEPENDENT_AMBULATORY_CARE_PROVIDER_SITE_OTHER): Payer: Medicare Other | Admitting: Pharmacist

## 2023-09-25 ENCOUNTER — Other Ambulatory Visit: Payer: Medicare Other

## 2023-09-25 DIAGNOSIS — I10 Essential (primary) hypertension: Secondary | ICD-10-CM | POA: Diagnosis not present

## 2023-09-25 DIAGNOSIS — E118 Type 2 diabetes mellitus with unspecified complications: Secondary | ICD-10-CM

## 2023-09-25 NOTE — Patient Instructions (Addendum)
Kennley,   It was great talking to you today!  I will call you tomorrow if we need to change your hydrochlorothiazide dose.   Restart Ozempic at 0.25 mg every Tuesday. Make sure you drink plenty of water and eat fruits and vegetables to help prevent constipation. You can also take the Miralax to help.   Check your blood sugars twice daily:  1) Fasting, first thing in the morning before breakfast and  2) 2 hours after your largest meal.   For a goal A1c of less than 7%, goal fasting readings are less than 130 and goal 2 hour after meal readings are less than 180.     Check your blood pressure once weekly, and any time you have concerning symptoms like headache, chest pain, dizziness, shortness of breath, or vision changes.   Our goal is less than 130/80.  To appropriately check your blood pressure, make sure you do the following:  1) Avoid caffeine, exercise, or tobacco products for 30 minutes before checking. Empty your bladder. 2) Sit with your back supported in a flat-backed chair. Rest your arm on something flat (arm of the chair, table, etc). 3) Sit still with your feet flat on the floor, resting, for at least 5 minutes.  4) Check your blood pressure. Take 1-2 readings.  5) Write down these readings and bring with you to any provider appointments.  Bring your home blood pressure machine with you to a provider's office for accuracy comparison at least once a year.   Make sure you take your blood pressure medications before you come to any office visit, even if you were asked to fast for labs.   Take care!  Catie Eppie Gibson, PharmD, BCACP, CPP Clinical Pharmacist [facility] [phone]

## 2023-09-25 NOTE — Progress Notes (Signed)
09/25/2023 Name: Candace Diaz MRN: [id number] DOB: 1956/10/01  Chief Complaint  Patient presents with   Hypertension   Diabetes   Medication Management    Candace Diaz is a 67 y.o. year old female who presented for a telephone visit.   They were referred to the pharmacist by their PCP for assistance in managing diabetes.    Subjective:  Care Team: Primary Care Provider: Ivonne Andrew, NP ; Next Scheduled Visit: 10/24/23  Medication Access/Adherence  Current Pharmacy:  [facility] [address] ([address]), [place] - [address] [place] ([place]) [place] [address] Phone: [phone] Fax: [phone]  [id number] - [place], [place] - [address] [place] [address] Phone: [phone] Fax: [phone]   Patient reports affordability concerns with their medications: No  Patient reports access/transportation concerns to their pharmacy: No  Patient reports adherence concerns with their medications:  No     Diabetes:  Current medications: metformin XR 1000 mg twice daily, Ozempic 0.5 mg - did not start with 0.25 mg dose. Notes she had nausea, constipation, vomiting, so she has not taken a dose today  Current glucose readings: reports morning readings 110-150s   Patient denies hypoglycemic s/sx including dizziness, shakiness, sweating. Patient denies hyperglycemic symptoms including polyuria, polydipsia, polyphagia, nocturia, neuropathy, blurred vision.  Current meal patterns:  - Breakfast: bowl of cereal (frosted flakes), apple juice; coffee  - Lunch: Small; bowl of soup; sandwich; little bag; water  - Supper: chicken, green vegetables; mac and cheese , potatoes - Snacks: small packs of cookies   Current physical activity: does walking; chair exercises every morning;   Hypertension:  Current medications: lisinopril 20 mg daily, hydrochlorothiazide 25 mg daily, amlodipine 10 mg daily   Patient has a  validated, automated, upper arm home BP cuff Current blood pressure readings readings: has not been checking  Prior issues with hypokalemia. Last BMP showed hypokalemia, was treated with short course of potassium   Hyperlipidemia/ASCVD Risk Reduction  Current lipid lowering medications: simvastatin 10 mg daily    Objective:  Lab Results  Component Value Date   HGBA1C 9.3 (A) 07/23/2023    Lab Results  Component Value Date   CREATININE 0.80 07/23/2023   BUN 9 07/23/2023   NA 137 07/23/2023   K 3.1 (L) 07/23/2023   CL 95 (L) 07/23/2023   CO2 25 07/23/2023    Lab Results  Component Value Date   CHOL 168 01/15/2023   HDL 62 01/15/2023   LDLCALC 92 01/15/2023   TRIG 76 01/15/2023   CHOLHDL 2.7 01/15/2023    Medications Reviewed Today     Reviewed by Alden Hipp, RPH-CPP (Pharmacist) on 09/25/23 at 1322  Med List Status: <None>   Medication Order Taking? Sig Documenting Provider Last Dose Status Informant  acetaminophen (TYLENOL) 500 MG tablet [id number]  Take 1,000 mg by mouth every 6 (six) hours as needed for moderate pain or headache. [provider]  Active Family Member           Med Note Particia Lather   Mon Aug 20, 2023  2:18 PM) PRN headaches  albuterol [facility] HFA) 108 (90 Base) MCG/ACT inhaler [id number] Yes INHALE 2 PUFFS BY MOUTH FOUR TIMES A DAY AS NEEDED FOR SHORTNESS OF BREATH. Ivonne Andrew, NP Taking Active            Med Note Dellie Burns Aug 20, 2023  2:15 PM) Using ~2  times/week  amLODipine (NORVASC) 10 MG tablet [id number] Yes Take 1 tablet (10 mg total) by mouth daily. Ivonne Andrew, NP Taking Active   baclofen (LIORESAL) 10 MG tablet [id number] Yes Take 1 tablet (10 mg total) by mouth 3 (three) times daily. As needed for muscle spasm Ivonne Andrew, NP Taking Active   cholecalciferol (VITAMIN D3) 25 MCG (1000 UT) tablet [id number] Yes Take 2,000 Units by mouth daily. [provider] Taking Active Family Member   Cyanocobalamin (VITAMIN B 12 PO) [id number] Yes Take by mouth. [provider] Taking Active   glucose blood (ACCU-CHEK GUIDE) test strip [id number]  Use as instructed Kallie Locks, FNP  Active   hydrochlorothiazide (HYDRODIURIL) 25 MG tablet [id number] Yes Take 1 tablet (25 mg total) by mouth daily. Ivonne Andrew, NP Taking Active   Insulin Pen Needle (B-D ULTRAFINE III SHORT PEN) 31G X 8 MM MISC [id number]  Use with insulin injections once daily Ivonne Andrew, NP  Active   Lancets (ACCU-CHEK MULTICLIX) lancets [id number] Yes USE TO CHECK BLOOD SUGAR TWICE DAILY Kallie Locks, FNP Taking Active   lisinopril (ZESTRIL) 20 MG tablet [id number] Yes Take 1 tablet by mouth once daily Ivonne Andrew, NP Taking Active   metFORMIN (GLUCOPHAGE-XR) 500 MG 24 hr tablet [id number] Yes Take 2 tablets (1,000 mg total) by mouth 2 (two) times daily with a meal. Ivonne Andrew, NP Taking Active   Multiple Vitamins-Minerals (MULTIVITAMIN WITH MINERALS) tablet [id number] Yes Take 2 tablets by mouth daily.  [provider] Taking Active Family Member  polyethylene glycol (MIRALAX / GLYCOLAX) 17 g packet [id number] Yes Take 17 g by mouth daily as needed for mild constipation. Orion Crook I, NP Taking Active            Med Note Raeanne Gathers May 07, 2023  1:38 PM)    Polyvinyl Alcohol-Povidone PF (REFRESH) 1.4-0.6 % SOLN [id number] Yes Place 2 drops into both eyes daily as needed. Orion Crook I, NP Taking Active            Med Note Sherilyn Cooter, [facility]   Mon Apr 16, 2023 11:45 AM) prn  Semaglutide,0.25 or 0.5MG /DOS, (OZEMPIC, 0.25 OR 0.5 MG/DOSE,) 2 MG/3ML SOPN [id number] No Inject 0.25 mg weekly for 4 weeks, then increase to 0.5 mg weekly  Patient not taking: Reported on 09/25/2023   Ivonne Andrew, NP Not Taking Active   sennosides-docusate sodium (SENOKOT-S) 8.6-50 MG tablet [id number] Yes Take 2 tablets by mouth daily. Teryl Lucy, MD Taking Active            Med  Note Clearance Coots, Mel Almond May 07, 2023  1:38 PM)    simvastatin (ZOCOR) 10 MG tablet [id number] Yes Take 1 tablet (10 mg total) by mouth every evening. Ivonne Andrew, NP Taking Active               Assessment/Plan:   Diabetes: - Currently uncontrolled - Reviewed long term cardiovascular and renal outcomes of uncontrolled blood sugar - Reviewed goal A1c, goal fasting, and goal 2 hour post prandial glucose - Reviewed dietary modifications including: reducing juice, high carbohydrate foods. Focus on lean proteins, fruits and vegetables, whole grains - Reviewed lifestyle modifications including: goal of 150 minutes weekly. Discussed increasing physical activity gradually - Recommend to restart Ozempic at 0.25 mg weekly. Patient verbalizes understanding  - Recommend to check glucose twice daily, fasting and 2 hour post prandial  Hypertension: - Currently controlled,  but with historical hyponatremia. Diuretic dose may need to be adjusted.  - Reviewed long term cardiovascular and renal outcomes of uncontrolled blood pressure - Reviewed appropriate blood pressure monitoring technique and reviewed goal blood pressure. Recommended to check home blood pressure and heart rate weekly.  - BMP ordered. Will follow up tomorrow    Hyperlipidemia/ASCVD Risk Reduction: - Currently uncontrolled.  - Recommend to continue current regimen. Recheck lipids with next visit. Will focus on above medication adjustments today, but will likely recommend changing simvastatin to different agent.     Follow Up Plan: PCP in 4 weeks  Catie Eppie Gibson, PharmD, BCACP, CPP Clinical Pharmacist [facility] [phone]

## 2023-09-27 ENCOUNTER — Telehealth: Payer: Self-pay | Admitting: Pharmacist

## 2023-09-27 NOTE — Telephone Encounter (Signed)
Patient with history of hypokalemia, not on potassium supplementation anymore. Ordered BMP on Tuesday but lab tech was unable to draw blood, patient was to schedule a lab appointment on check out but did not.   Contacted patient, left voicemail for her to call the office to schedule a nonfasting lab appointment at her convenience.

## 2023-10-24 ENCOUNTER — Encounter: Payer: Self-pay | Admitting: Nurse Practitioner

## 2023-10-24 ENCOUNTER — Ambulatory Visit (INDEPENDENT_AMBULATORY_CARE_PROVIDER_SITE_OTHER): Payer: Medicare Other | Admitting: Nurse Practitioner

## 2023-10-24 VITALS — BP 126/68 | HR 81 | Resp 14 | Ht 68.0 in | Wt 215.0 lb

## 2023-10-24 DIAGNOSIS — E1100 Type 2 diabetes mellitus with hyperosmolarity without nonketotic hyperglycemic-hyperosmolar coma (NKHHC): Secondary | ICD-10-CM

## 2023-10-24 LAB — POCT GLYCOSYLATED HEMOGLOBIN (HGB A1C): HbA1c, POC (controlled diabetic range): 7.7 % — AB (ref 0.0–7.0)

## 2023-10-24 NOTE — Patient Instructions (Signed)
1. Type 2 diabetes mellitus with hyperosmolarity without coma, unspecified whether long term insulin use (HCC)  - CBC - Comprehensive metabolic panel  Follow up:  Follow up in 3 months

## 2023-10-24 NOTE — Progress Notes (Signed)
Subjective   Patient ID: Candace Diaz, female    DOB: 08-Mar-1956, 67 y.o.   MRN: [id number]  Chief Complaint  Patient presents with   Follow-up    Referring provider: Ivonne Andrew, NP  Candace Diaz is a 67 y.o. 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 Symptoms: none. Patient denies hypoglycemia , increase appetite, and nausea.  Evaluation to date has been included: hemoglobin A1C. Treatment to date: no recent interventions. A1C in office today is improved at 7.7. Is following with pharmacy for diabetic medication management.   Hypertension:  Current medications: lisinopril 20 mg daily, hydrochlorothiazide 25 mg daily, amlodipine 10 mg daily     Patient has a validated, automated, upper arm home BP cuff Current blood pressure readings readings: has not been checking   Prior issues with hypokalemia. Last BMP showed hypokalemia.     Hyperlipidemia/ASCVD Risk Reduction   Current lipid lowering medications: simvastatin 10 mg daily  Allergies  Allergen Reactions   Aspirin Other (See Comments)    Internal bleeding   Penicillins Hives    Did it involve swelling of the face/tongue/throat, SOB, or low BP? Unknown Did it involve sudden or severe rash/hives, skin peeling, or any reaction on the inside of your mouth or nose? Unknown Did you need to seek medical attention at a hospital or doctor's office? Unknown When did it last happen?      years If all above answers are "NO", may proceed with cephalosporin use.    Immunization History  Administered Date(s) Administered   Influenza,inj,Quad PF,6+ Mos 12/03/2014, 09/09/2015, 10/30/2016, 08/31/2017, 09/09/2018, 10/07/2021    Pneumococcal Conjugate-13 01/24/2016   Pneumococcal Polysaccharide-23 08/14/2014   Tdap 08/14/2014    Tobacco History: Social History   Tobacco Use  Smoking Status Former   Current packs/day: 0.00   Types: Cigarettes   Quit date: 2000   Years since quitting: 24.8  Smokeless Tobacco Never   Counseling given: Not Answered   Outpatient Encounter Medications as of 10/24/2023  Medication Sig   acetaminophen (TYLENOL) 500 MG tablet Take 1,000 mg by mouth every 6 (six) hours as needed for moderate pain or headache.   albuterol (PROAIR HFA) 108 (90 Base) MCG/ACT inhaler INHALE 2 PUFFS BY MOUTH FOUR TIMES A DAY AS NEEDED FOR SHORTNESS OF BREATH.   amLODipine (NORVASC) 10 MG tablet Take 1 tablet (10 mg total) by mouth daily.   baclofen (LIORESAL) 10 MG tablet Take 1 tablet (10 mg total) by mouth 3 (three) times daily. As needed for muscle spasm   Cyanocobalamin (VITAMIN B 12 PO) Take by mouth.   hydrochlorothiazide (HYDRODIURIL) 25 MG tablet Take 1 tablet (25 mg total) by mouth daily.   Insulin Pen Needle (B-D ULTRAFINE III SHORT PEN) 31G X 8 MM MISC Use with insulin injections once daily   Lancets (ACCU-CHEK MULTICLIX) lancets USE TO CHECK BLOOD SUGAR TWICE DAILY   lisinopril (ZESTRIL) 20 MG tablet Take 1 tablet by mouth once daily   metFORMIN (GLUCOPHAGE-XR) 500 MG 24 hr tablet Take 2 tablets (1,000 mg total) by mouth 2 (two) times daily with a meal.   Multiple Vitamins-Minerals (MULTIVITAMIN WITH MINERALS) tablet Take 2 tablets by mouth daily.  polyethylene glycol (MIRALAX / GLYCOLAX) 17 g packet Take 17 g by mouth daily as needed for mild constipation.   Semaglutide,0.25 or 0.5MG /DOS, (OZEMPIC, 0.25 OR 0.5 MG/DOSE,) 2 MG/3ML SOPN Inject 0.25 mg weekly for 4 weeks, then increase to 0.5 mg weekly   cholecalciferol (VITAMIN D3) 25 MCG (1000 UT) tablet Take 2,000 Units by mouth daily. (Patient not taking: Reported on 10/24/2023)   glucose blood (ACCU-CHEK GUIDE) test strip Use as  instructed (Patient not taking: Reported on 10/24/2023)   Polyvinyl Alcohol-Povidone PF (REFRESH) 1.4-0.6 % SOLN Place 2 drops into both eyes daily as needed. (Patient not taking: Reported on 10/24/2023)   sennosides-docusate sodium (SENOKOT-S) 8.6-50 MG tablet Take 2 tablets by mouth daily. (Patient not taking: Reported on 10/24/2023)   simvastatin (ZOCOR) 10 MG tablet Take 1 tablet (10 mg total) by mouth every evening. (Patient not taking: Reported on 10/24/2023)   No facility-administered encounter medications on file as of 10/24/2023.    Review of Systems  Review of Systems  Constitutional: Negative.   HENT: Negative.    Cardiovascular: Negative.   Gastrointestinal: Negative.   Allergic/Immunologic: Negative.   Neurological: Negative.   Psychiatric/Behavioral: Negative.       Objective:   BP 126/68 (BP Location: Right Arm, Patient Position: Sitting, Cuff Size: Normal)   Pulse 81   Resp 14   Ht 5\' 8"  (1.727 m)   Wt 215 lb (97.5 kg)   SpO2 100%   BMI 32.69 kg/m   Wt Readings from Last 5 Encounters:  10/24/23 215 lb (97.5 kg)  08/16/23 225 lb (102.1 kg)  07/23/23 225 lb 12.8 oz (102.4 kg)  04/16/23 228 lb (103.4 kg)  01/15/23 230 lb (104.3 kg)     Physical Exam Vitals and nursing note reviewed.  Constitutional:      General: She is not in acute distress.    Appearance: She is well-developed.  Cardiovascular:     Rate and Rhythm: Normal rate and regular rhythm.  Pulmonary:     Effort: Pulmonary effort is normal.     Breath sounds: Normal breath sounds.  Neurological:     Mental Status: She is alert and oriented to person, place, and time.       Assessment & Plan:   Type 2 diabetes mellitus with hyperosmolarity without coma, unspecified whether long term insulin use (HCC) -     CBC -     Comprehensive metabolic panel -     POCT glycosylated hemoglobin (Hb A1C)     Return in about 3 months (around 01/24/2024).   Ivonne Andrew, NP 10/24/2023

## 2023-10-25 ENCOUNTER — Encounter: Payer: Self-pay | Admitting: *Deleted

## 2023-10-25 LAB — CBC
Hematocrit: 38.7 % (ref 34.0–46.6)
Hemoglobin: 12.1 g/dL (ref 11.1–15.9)
MCH: 29 pg (ref 26.6–33.0)
MCHC: 31.3 g/dL — ABNORMAL LOW (ref 31.5–35.7)
MCV: 93 fL (ref 79–97)
Platelets: 323 10*3/uL (ref 150–450)
RBC: 4.17 x10E6/uL (ref 3.77–5.28)
RDW: 12.6 % (ref 11.7–15.4)
WBC: 4.7 10*3/uL (ref 3.4–10.8)

## 2023-10-25 LAB — COMPREHENSIVE METABOLIC PANEL
ALT: 44 [IU]/L — ABNORMAL HIGH (ref 0–32)
AST: 37 [IU]/L (ref 0–40)
Albumin: 4.5 g/dL (ref 3.9–4.9)
Alkaline Phosphatase: 69 [IU]/L (ref 44–121)
BUN/Creatinine Ratio: 14 (ref 12–28)
BUN: 12 mg/dL (ref 8–27)
Bilirubin Total: 0.7 mg/dL (ref 0.0–1.2)
CO2: 25 mmol/L (ref 20–29)
Calcium: 9.4 mg/dL (ref 8.7–10.3)
Chloride: 102 mmol/L (ref 96–106)
Creatinine, Ser: 0.83 mg/dL (ref 0.57–1.00)
Globulin, Total: 3.2 g/dL (ref 1.5–4.5)
Glucose: 141 mg/dL — ABNORMAL HIGH (ref 70–99)
Potassium: 3.3 mmol/L — ABNORMAL LOW (ref 3.5–5.2)
Sodium: 144 mmol/L (ref 134–144)
Total Protein: 7.7 g/dL (ref 6.0–8.5)
eGFR: 78 mL/min/{1.73_m2} (ref >59)

## 2023-11-20 ENCOUNTER — Other Ambulatory Visit: Payer: Self-pay

## 2023-11-20 DIAGNOSIS — R0609 Other forms of dyspnea: Secondary | ICD-10-CM

## 2023-11-20 DIAGNOSIS — K5909 Other constipation: Secondary | ICD-10-CM

## 2023-11-20 DIAGNOSIS — I10 Essential (primary) hypertension: Secondary | ICD-10-CM

## 2023-11-20 DIAGNOSIS — E1100 Type 2 diabetes mellitus with hyperosmolarity without nonketotic hyperglycemic-hyperosmolar coma (NKHHC): Secondary | ICD-10-CM

## 2023-11-20 MED ORDER — METFORMIN HCL ER 500 MG PO TB24: 1000.0000 mg | ORAL_TABLET | Freq: Two times a day (BID) | ORAL | 1 refills | Status: DC

## 2023-11-20 MED ORDER — HYDROCHLOROTHIAZIDE 25 MG PO TABS: 25.0000 mg | ORAL_TABLET | Freq: Every day | ORAL | 3 refills | Status: DC

## 2023-11-20 MED ORDER — LISINOPRIL 20 MG PO TABS: 20.0000 mg | ORAL_TABLET | Freq: Every day | ORAL | 0 refills | Status: DC

## 2023-11-20 MED ORDER — OZEMPIC (0.25 OR 0.5 MG/DOSE) 2 MG/3ML ~~LOC~~ SOPN: PEN_INJECTOR | SUBCUTANEOUS | 2 refills | Status: DC

## 2023-11-20 MED ORDER — AMLODIPINE BESYLATE 10 MG PO TABS: 10.0000 mg | ORAL_TABLET | Freq: Every day | ORAL | 3 refills | Status: DC

## 2023-11-20 MED ORDER — ALBUTEROL SULFATE HFA 108 (90 BASE) MCG/ACT IN AERS: INHALATION_SPRAY | RESPIRATORY_TRACT | 11 refills | Status: DC

## 2023-11-20 MED ORDER — POLYETHYLENE GLYCOL 3350 17 G PO PACK: 17.0000 g | PACK | Freq: Every day | ORAL | 99 refills | Status: AC | PRN

## 2023-11-27 ENCOUNTER — Telehealth: Payer: Self-pay | Admitting: Pharmacist

## 2023-11-27 ENCOUNTER — Other Ambulatory Visit: Payer: Self-pay

## 2023-11-27 NOTE — Telephone Encounter (Addendum)
Appears patient spoke with E2C2 and rescheduled.   Catie Eppie Gibson, PharmD, BCACP, CPP Clinical Pharmacist [facility] [phone]

## 2023-11-27 NOTE — Progress Notes (Signed)
Attempted to contact patient for scheduled appointment for medication management. Left HIPAA compliant message for patient to return my call at their convenience.   Catie T. Harper, PharmD, BCACP, CPP Clinical Pharmacist [facility] [phone]  

## 2023-12-27 ENCOUNTER — Other Ambulatory Visit: Payer: Self-pay | Admitting: Nurse Practitioner

## 2023-12-27 ENCOUNTER — Other Ambulatory Visit: Payer: Self-pay

## 2023-12-27 MED ORDER — OZEMPIC (0.25 OR 0.5 MG/DOSE) 2 MG/3ML ~~LOC~~ SOPN: PEN_INJECTOR | SUBCUTANEOUS | 2 refills | Status: DC

## 2023-12-28 ENCOUNTER — Other Ambulatory Visit: Payer: Self-pay

## 2023-12-28 ENCOUNTER — Other Ambulatory Visit (HOSPITAL_COMMUNITY): Payer: Self-pay

## 2023-12-28 ENCOUNTER — Other Ambulatory Visit (HOSPITAL_BASED_OUTPATIENT_CLINIC_OR_DEPARTMENT_OTHER): Payer: Self-pay

## 2023-12-28 ENCOUNTER — Other Ambulatory Visit: Payer: Medicare Other | Admitting: Pharmacist

## 2023-12-28 DIAGNOSIS — R0609 Other forms of dyspnea: Secondary | ICD-10-CM

## 2023-12-28 DIAGNOSIS — E1100 Type 2 diabetes mellitus with hyperosmolarity without nonketotic hyperglycemic-hyperosmolar coma (NKHHC): Secondary | ICD-10-CM

## 2023-12-28 DIAGNOSIS — I10 Essential (primary) hypertension: Secondary | ICD-10-CM

## 2023-12-28 MED ORDER — HYDROCHLOROTHIAZIDE 25 MG PO TABS
25.0000 mg | ORAL_TABLET | Freq: Every day | ORAL | 3 refills | Status: DC
  Filled 2023-12-28 – 2024-03-04 (×2): qty 90, 90d supply, fill #0

## 2023-12-28 MED ORDER — LISINOPRIL 20 MG PO TABS
20.0000 mg | ORAL_TABLET | Freq: Every day | ORAL | 3 refills | Status: DC
  Filled 2023-12-28 – 2024-03-04 (×2): qty 90, 90d supply, fill #0
  Filled 2024-05-30: qty 90, 90d supply, fill #1

## 2023-12-28 MED ORDER — ALBUTEROL SULFATE HFA 108 (90 BASE) MCG/ACT IN AERS
2.0000 | INHALATION_SPRAY | Freq: Four times a day (QID) | RESPIRATORY_TRACT | 11 refills | Status: AC | PRN
  Filled 2023-12-28 – 2024-10-27 (×2): qty 6.7, 25d supply, fill #0

## 2023-12-28 MED ORDER — AMLODIPINE BESYLATE 10 MG PO TABS
10.0000 mg | ORAL_TABLET | Freq: Every day | ORAL | 3 refills | Status: DC
  Filled 2023-12-28 – 2024-03-04 (×2): qty 90, 90d supply, fill #0
  Filled 2024-05-30: qty 90, 90d supply, fill #1
  Filled 2024-09-10: qty 90, 90d supply, fill #2

## 2023-12-28 MED ORDER — METFORMIN HCL ER 500 MG PO TB24
500.0000 mg | ORAL_TABLET | Freq: Two times a day (BID) | ORAL | 1 refills | Status: DC
  Filled 2023-12-28 – 2024-03-04 (×2): qty 180, 90d supply, fill #0
  Filled 2024-05-30: qty 180, 90d supply, fill #1

## 2023-12-28 MED ORDER — OZEMPIC (0.25 OR 0.5 MG/DOSE) 2 MG/3ML ~~LOC~~ SOPN
0.5000 mg | PEN_INJECTOR | SUBCUTANEOUS | 3 refills | Status: DC
  Filled 2023-12-28: qty 9, fill #0
  Filled 2024-03-04: qty 9, 90d supply, fill #0
  Filled 2024-07-30: qty 9, 90d supply, fill #1

## 2023-12-28 MED ORDER — ROSUVASTATIN CALCIUM 20 MG PO TABS
20.0000 mg | ORAL_TABLET | Freq: Every day | ORAL | 3 refills | Status: DC
  Filled 2023-12-28: qty 90, 90d supply, fill #0
  Filled 2024-04-07: qty 90, 90d supply, fill #1
  Filled 2024-07-28: qty 90, 90d supply, fill #2
  Filled 2024-10-27: qty 90, 90d supply, fill #3

## 2023-12-28 NOTE — Progress Notes (Signed)
 Patient presented to office for appointment but this was a telephone appointment. I called her later to have phone appointment but she did not answer. Left voicemail and will work to reschedule.   Of note, continued hypokalemia on labs. Made note for PCP to address at upcoming visit.   Catie IVAR Centers, PharmD, BCACP, CPP Clinical Pharmacist [facility] [phone]

## 2023-12-28 NOTE — Progress Notes (Signed)
 12/28/2023 Name: Anaih Brander MRN: [id number] DOB: 1956/03/28  Chief Complaint  Patient presents with   Medication Management   Diabetes    Indica Marcott Wiegman is a 68 y.o. year old female who presented for a telephone visit.   They were referred to the pharmacist by their PCP for assistance in managing diabetes, hypertension, and hyperlipidemia.    Subjective:  Care Team: Primary Care Provider: Oley Bascom RAMAN, NP ; Next Scheduled Visit: 01/23/23  Medication Access/Adherence  Current Pharmacy:  [facility] - [facility] [address] [place] [address] Phone: [phone] Fax: [phone]   Patient reports affordability concerns with their medications: No  Patient reports access/transportation concerns to their pharmacy: No  Patient reports adherence concerns with their medications:  No    Has multiple pharmacies. She notes that sometimes she needs mail order, sometimes she wants to pick up. Discussed [facility] and that she could do that. She would like to transfer prescriptions.   Diabetes:  Current medications: metformin  XR 1000 mg twice daily - has been taking 500 mg twice daily; Ozempic  0.5 mg weekly - has still been taking 0.25 mg weekly  Denies stomach upset, nausea, constipation  Current glucose readings: fastings: 100-110s; afternoons: 120s;   Patient denies hypoglycemic s/sx including dizziness, shakiness, sweating. Patient denies hyperglycemic symptoms including polyuria, polydipsia, polyphagia, nocturia, neuropathy, blurred vision.  Current medication access support: denies cost concerns  Hypertension:  Current medications: lisinopril  20 mg daily, hydrochlorothiazide  25 mg daily, amlodipine  10 mg daily  Patient does has a validated, automated, upper arm home BP cuff; however, does not remember any home readings   Patient denies hypotensive s/sx including dizziness, lightheadedness.  Patient denies hypertensive symptoms  including headache, chest pain, shortness of breath  Hyperlipidemia/ASCVD Risk Reduction  Current lipid lowering medications: previously prescribed simvastatin  but appears she stopped refilling; today she tells me that she thought it was discontinued   ASCVD History: CVA    Objective:  Lab Results  Component Value Date   HGBA1C 7.7 (A) 10/24/2023    Lab Results  Component Value Date   CREATININE 0.83 10/24/2023   BUN 12 10/24/2023   NA 144 10/24/2023   K 3.3 (L) 10/24/2023   CL 102 10/24/2023   CO2 25 10/24/2023    Lab Results  Component Value Date   CHOL 168 01/15/2023   HDL 62 01/15/2023   LDLCALC 92 01/15/2023   TRIG 76 01/15/2023   CHOLHDL 2.7 01/15/2023    Medications Reviewed Today     Reviewed by Rudy Dorothyann DASEN, RPH-CPP (Pharmacist) on 12/28/23 at 1136  Med List Status: <None>   Medication Order Taking? Sig Documenting Provider Last Dose Status Informant  acetaminophen  (TYLENOL ) 500 MG tablet [id number]  Take 1,000 mg by mouth every 6 (six) hours as needed for moderate pain or headache. [provider]  Active Family Member           Med Note DELSA LORAIN SQUIBB   Mon Aug 20, 2023  2:18 PM) PRN headaches  albuterol  (PROAIR  HFA) 108 (90 Base) MCG/ACT inhaler [id number]  INHALE 2 PUFFS BY MOUTH FOUR TIMES A DAY AS NEEDED FOR SHORTNESS OF BREATH. Oley Bascom RAMAN, NP  Active   amLODipine  (NORVASC ) 10 MG tablet [id number]  Take 1 tablet (10 mg total) by mouth daily. Oley Bascom RAMAN, NP  Active   baclofen  (LIORESAL ) 10 MG tablet [id number]  Take 1 tablet (10 mg total) by mouth 3 (three) times daily. As  needed for muscle spasm Oley Bascom RAMAN, NP  Active   cholecalciferol  (VITAMIN D3) 25 MCG (1000 UT) tablet [id number]  Take 2,000 Units by mouth daily.  Patient not taking: Reported on 10/24/2023   [provider]  Active Family Member  Cyanocobalamin  (VITAMIN B 12 PO) [id number]  Take by mouth. [provider]  Active   glucose blood  (ACCU-CHEK GUIDE) test strip [id number]  Use as instructed  Patient not taking: Reported on 10/24/2023   Stroud, Natalie M, FNP  Active   hydrochlorothiazide  (HYDRODIURIL ) 25 MG tablet [id number]  Take 1 tablet (25 mg total) by mouth daily. Oley Bascom RAMAN, NP  Active   Insulin  Pen Needle (B-D ULTRAFINE III SHORT PEN) 31G X 8 MM MISC [id number]  Use with insulin  injections once daily Nichols, Tonya S, NP  Active   Lancets (ACCU-CHEK MULTICLIX) lancets [id number]  USE TO CHECK BLOOD SUGAR TWICE DAILY Stroud, Natalie M, FNP  Active   lisinopril  (ZESTRIL ) 20 MG tablet [id number]  Take 1 tablet (20 mg total) by mouth daily. Oley Bascom RAMAN, NP  Active   metFORMIN  (GLUCOPHAGE -XR) 500 MG 24 hr tablet [id number]  Take 1 tablet (500 mg total) by mouth 2 (two) times daily with a meal. Nichols, Tonya S, NP  Active   Multiple Vitamins-Minerals (MULTIVITAMIN WITH MINERALS) tablet [id number]  Take 2 tablets by mouth daily.  [provider]  Active Family Member  polyethylene glycol (MIRALAX  / GLYCOLAX ) 17 g packet [id number]  Take 17 g by mouth daily as needed for mild constipation. Oley Bascom RAMAN, NP  Active   Polyvinyl Alcohol -Povidone PF (REFRESH) 1.4-0.6 % SOLN [id number]  Place 2 drops into both eyes daily as needed.  Patient not taking: Reported on 10/24/2023   Shannan Sia I, NP  Active            Med Note SAMULE, [facility]   Mon Apr 16, 2023 11:45 AM) prn  rosuvastatin  (CRESTOR ) 20 MG tablet [id number] Yes Take 1 tablet (20 mg total) by mouth daily. Oley Bascom RAMAN, NP  Active   Semaglutide ,0.25 or 0.5MG /DOS, (OZEMPIC , 0.25 OR 0.5 MG/DOSE,) 2 MG/3ML SOPN [id number]  Inject 0.5 mg into the skin once a week. Oley Bascom RAMAN, NP  Active   sennosides-docusate sodium  (SENOKOT-S) 8.6-50 MG tablet [id number]  Take 2 tablets by mouth daily.  Patient not taking: Reported on 10/24/2023   Josefina Chew, MD  Active            Med Note BUZZ, DOROTHYANN ONEIDA Kitchens May 07, 2023  1:38 PM)                 Assessment/Plan:   Medication Management: - Will collaborate with PCP to have all medications sent to Pharmacy at [facility]. Patient will call to set up mail order. Contacted [facility] to ask to cancel refills, contacted [facility] to cancel refills.    Diabetes: - Currently uncontrolled but improved  - Reviewed goal A1c, goal fasting, and goal 2 hour post prandial glucose - Recommend to continue the dose of metformin  that patient is currently taking (500 mg twice daily) and increase Ozempic  to 0.5 mg weekly. Discussed with patient, she verbalizes understanding. Will collaborate with PCP for orders.  - Recommend to check glucose periodically, alternating between fasting and 2 hour post prandial  Hypertension: - Currently controlled but hypokalemia likely exacerbated by hydrochlorothiazide .  - Reviewed appropriate blood pressure monitoring technique and reviewed goal blood pressure. Recommended to check home blood pressure and heart  rate periodically, document, and provide at future appointments - Recommend to continue current regimen, check BMP at next appointment. If continued hypokalemia and controlled BP, recommend to reduce hydrochlorothiazide  to 12.5 mg daily and recheck BMP .  Hyperlipidemia/ASCVD Risk Reduction: - Currently uncontrolled, high intensity statin appropriate given history of CVA. Drug/drug interaction with simvastatin  and amlodipine .  - Recommend to restart statin therapy with rosuvastatin  20 mg daily. Patient verbalizes understanding. Will collaborate with provider for order.    Follow Up Plan: PCP in 3 weeks, PharmD after that pending labs results.   Catie IVAR Centers, PharmD, BCACP, CPP Clinical Pharmacist [facility] [phone]

## 2023-12-28 NOTE — Addendum Note (Signed)
 Addended by: Nilda Simmer T on: 12/28/2023 11:43 AM   Modules accepted: Orders

## 2024-01-10 ENCOUNTER — Telehealth: Payer: Self-pay

## 2024-01-10 NOTE — Progress Notes (Signed)
Complex Care Management Care Guide Note  01/10/2024 Name: Candace Diaz MRN: [id number] DOB: 01-25-1956  Candace Diaz is a 68 y.o. year old female who is a primary care patient of Ivonne Andrew, NP and is actively engaged with the care management team. I reached out to [employer] by phone today to assist with re-scheduling  with the Pharmacist.  Follow up plan: Unsuccessful telephone outreach attempt made. A HIPAA compliant phone message was left for the patient providing contact information and requesting a return call.  Penne Lash , RMA     [facility]  [facility] [place] [facility] Guide  Direct Dial: [phone]  Website: [email or web address]

## 2024-01-24 ENCOUNTER — Encounter: Payer: Self-pay | Admitting: Nurse Practitioner

## 2024-01-24 ENCOUNTER — Ambulatory Visit: Payer: Medicare HMO | Admitting: Nurse Practitioner

## 2024-01-24 VITALS — BP 128/70 | HR 69 | Wt 213.0 lb

## 2024-01-24 DIAGNOSIS — E1100 Type 2 diabetes mellitus with hyperosmolarity without nonketotic hyperglycemic-hyperosmolar coma (NKHHC): Secondary | ICD-10-CM | POA: Diagnosis not present

## 2024-01-24 LAB — POCT GLYCOSYLATED HEMOGLOBIN (HGB A1C): Hemoglobin A1C: 7.1 % — AB (ref 4.0–5.6)

## 2024-01-24 NOTE — Patient Instructions (Signed)
1. Type 2 diabetes mellitus with hyperosmolarity without coma, unspecified whether long term insulin use (HCC) (Primary)  - POCT glycosylated hemoglobin (Hb A1C) - Microalbumin/Creatinine Ratio, Urine - Lipid Panel  Follow up:  Follow up in 3 months

## 2024-01-24 NOTE — Progress Notes (Signed)
Subjective   Patient ID: Candace Diaz, female    DOB: 20-Jan-1956, 68 y.o.   MRN: [id number]  Chief Complaint  Patient presents with  . Diabetes  . Medical Management of Chronic Issues    Referring provider: Ivonne Andrew, NP  Candace Diaz is a 68 y.o. 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 Symptoms: none. Patient denies hypoglycemia , increase appetite, and nausea.  Evaluation to date has been included: hemoglobin A1C. Treatment to date: no recent interventions. A1C in office today is improved at 7.1. Is following with pharmacy for diabetic medication management.    Hypertension:   Current medications: lisinopril 20 mg daily, hydrochlorothiazide 25 mg daily, amlodipine 10 mg daily     Patient has a validated, automated, upper arm home BP cuff Current blood pressure readings readings: has not been checking   Prior issues with hypokalemia. Last BMP showed hypokalemia.     Hyperlipidemia/ASCVD Risk Reduction   Current lipid lowering medications: simvastatin 10 mg daily   Denies f/c/s, n/v/d, hemoptysis, PND, leg swelling Denies chest pain or edema     Allergies  Allergen Reactions  . Aspirin Other (See Comments)    Internal bleeding  . Penicillins Hives    Did it involve swelling of the face/tongue/throat, SOB, or low BP? Unknown Did it involve sudden or severe rash/hives, skin peeling, or any reaction on the inside of your mouth or nose? Unknown Did you need to seek medical attention at a hospital or doctor's office? Unknown When did it last happen?      years If all above answers are "NO", may proceed with cephalosporin use.    Immunization History   Administered Date(s) Administered  . Influenza,inj,Quad PF,6+ Mos 12/03/2014, 09/09/2015, 10/30/2016, 08/31/2017, 09/09/2018, 10/07/2021  . Pneumococcal Conjugate-13 01/24/2016  . Pneumococcal Polysaccharide-23 08/14/2014  . Tdap 08/14/2014    Tobacco History: Social History   Tobacco Use  Smoking Status Former  . Current packs/day: 0.00  . Types: Cigarettes  . Quit date: 2000  . Years since quitting: 25.0  Smokeless Tobacco Never   Counseling given: Not Answered   Outpatient Encounter Medications as of 01/24/2024  Medication Sig  . acetaminophen (TYLENOL) 500 MG tablet Take 1,000 mg by mouth every 6 (six) hours as needed for moderate pain or headache.  . albuterol (PROAIR HFA) 108 (90 Base) MCG/ACT inhaler INHALE 2 PUFFS BY MOUTH FOUR TIMES A DAY AS NEEDED FOR SHORTNESS OF BREATH.  Marland Kitchen amLODipine (NORVASC) 10 MG tablet Take 1 tablet (10 mg total) by mouth daily.  . baclofen (LIORESAL) 10 MG tablet Take 10 mg by mouth 3 (three) times daily.  . cholecalciferol (VITAMIN D3) 25 MCG (1000 UT) tablet Take 2,000 Units by mouth daily.  . Cyanocobalamin (VITAMIN B 12 PO) Take by mouth.  Marland Kitchen glucose blood (ACCU-CHEK GUIDE) test strip Use as instructed  . hydrochlorothiazide (HYDRODIURIL) 25 MG tablet Take 1 tablet (25 mg total) by mouth daily.  . Lancets (ACCU-CHEK MULTICLIX) lancets USE TO CHECK BLOOD SUGAR TWICE DAILY  . lisinopril (ZESTRIL) 20 MG tablet Take 1 tablet (20 mg total) by mouth daily.  . metFORMIN (GLUCOPHAGE-XR) 500 MG 24 hr tablet Take 1 tablet (500 mg  total) by mouth 2 (two) times daily with a meal.  . Multiple Vitamins-Minerals (MULTIVITAMIN WITH MINERALS) tablet Take 2 tablets by mouth daily.   . polyethylene glycol (MIRALAX / GLYCOLAX) 17 g packet Take 17 g by mouth daily as needed for mild constipation.  . rosuvastatin (CRESTOR) 20 MG tablet Take 1 tablet (20 mg total) by mouth daily.  . Semaglutide,0.25 or 0.5MG /DOS, (OZEMPIC, 0.25 OR 0.5 MG/DOSE,) 2 MG/3ML SOPN Inject  0.5 mg into the skin once a week.  . Polyvinyl Alcohol-Povidone PF (REFRESH) 1.4-0.6 % SOLN Place 2 drops into both eyes daily as needed. (Patient not taking: Reported on 01/24/2024)   No facility-administered encounter medications on file as of 01/24/2024.    Review of Systems  Review of Systems  Constitutional: Negative.   HENT: Negative.    Cardiovascular: Negative.   Gastrointestinal: Negative.   Allergic/Immunologic: Negative.   Neurological: Negative.   Psychiatric/Behavioral: Negative.       Objective:   BP 128/70   Pulse 69   Wt 213 lb (96.6 kg)   SpO2 98%   BMI 32.39 kg/m   Wt Readings from Last 5 Encounters:  01/24/24 213 lb (96.6 kg)  10/24/23 215 lb (97.5 kg)  08/16/23 225 lb (102.1 kg)  07/23/23 225 lb 12.8 oz (102.4 kg)  04/16/23 228 lb (103.4 kg)     Physical Exam Vitals and nursing note reviewed.  Constitutional:      General: She is not in acute distress.    Appearance: She is well-developed.  Cardiovascular:     Rate and Rhythm: Normal rate and regular rhythm.  Pulmonary:     Effort: Pulmonary effort is normal.     Breath sounds: Normal breath sounds.  Neurological:     Mental Status: She is alert and oriented to person, place, and time.      Assessment & Plan:   Type 2 diabetes mellitus with hyperosmolarity without coma, unspecified whether long term insulin use (HCC) -     POCT glycosylated hemoglobin (Hb A1C) -     Microalbumin / creatinine urine ratio -     Lipid panel     Return in about 3 months (around 04/23/2024).   Ivonne Andrew, NP 01/24/2024

## 2024-01-25 LAB — LIPID PANEL
Chol/HDL Ratio: 2.1 {ratio} (ref 0.0–4.4)
Cholesterol, Total: 113 mg/dL (ref 100–199)
HDL: 55 mg/dL (ref >39)
LDL Chol Calc (NIH): 41 mg/dL (ref 0–99)
Triglycerides: 86 mg/dL (ref 0–149)
VLDL Cholesterol Cal: 17 mg/dL (ref 5–40)

## 2024-01-25 LAB — MICROALBUMIN / CREATININE URINE RATIO
Creatinine, Urine: 158.8 mg/dL
Microalb/Creat Ratio: 23 mg/g{creat} (ref 0–29)
Microalbumin, Urine: 36.2 ug/mL

## 2024-02-01 ENCOUNTER — Telehealth: Payer: Self-pay

## 2024-02-01 NOTE — Telephone Encounter (Signed)
 Copied from CRM [id number]. Topic: General - Call Back - No Documentation >> Jan 31, 2024 10:54 AM Lizabeth Riggs wrote: Reason for CRM: Candace Diaz was returning a call and I see no notes for today on Chart.  Please call her back if needed.

## 2024-03-03 ENCOUNTER — Other Ambulatory Visit: Payer: Self-pay

## 2024-03-03 ENCOUNTER — Other Ambulatory Visit (HOSPITAL_COMMUNITY): Payer: Self-pay

## 2024-03-03 DIAGNOSIS — I10 Essential (primary) hypertension: Secondary | ICD-10-CM

## 2024-03-03 NOTE — Progress Notes (Signed)
 03/03/2024 Name: Candace Diaz MRN: [id number] DOB: Sep 04, 1956  Chief Complaint  Patient presents with   Diabetes   Hypertension   Hyperlipidemia    Candace Diaz is a 68 y.o. year old female who presented for a telephone visit.   They were referred to the pharmacist by their PCP for assistance in managing diabetes, hypertension, and hyperlipidemia.    Subjective:   Patient reports she is doing well.  Care Team: Primary Care Provider: Ivonne Andrew, NP ; Next Scheduled Visit: 04/24/2024  Medication Access/Adherence  Current Pharmacy:  [facility] - [facility] [address] [place] [address] Phone: [phone] Fax: [phone]   Patient reports affordability concerns with their medications: No  Patient reports access/transportation concerns to their pharmacy: No  Patient reports adherence concerns with their medications:  Yes - Notes that she is running low on some of her maintenance medications and has not received shipment from [facility] outpatient pharmacy yet. Fill history shows potential non-adherence to Ozempic, hydrochlorothiazide, lisinopril, and amlodipine, and metformin, but patient denies any missed doses.   Diabetes:  Current medications: metformin XR 500 mg twice daily, Ozempic 0.5 mg weekly (Wednesdays) Medications tried in the past:   Patient reports she is tolerating Ozempic well and denies n/v, constipation, and diarrhea.  Using glucometer; testing two times daily Fasting BG: ~112 Before bed: 110-120s  Patient denies hypoglycemic s/sx including dizziness, shakiness, sweating. Patient denies hyperglycemic symptoms including polyuria, polydipsia, polyphagia, nocturia, neuropathy, blurred vision.   Hypertension:  Current medications: lisinopril 20 mg daily, hydrochlorothiazide 25 mg daily, amlodipine 10 mg daily  Patient has a validated, automated, upper arm home BP cuff. Has not checked BP this week and cannot recall any  recent home readings.  Patient denies hypotensive s/sx including dizziness, lightheadedness.  Patient denies hypertensive symptoms including headache, chest pain, shortness of breath   Hyperlipidemia/ASCVD Risk Reduction  Current lipid lowering medications: rosuvastatin 20 mg daily Medications tried in the past: simvastatin (switched due to drug interaction with amlodipine)  ASCVD History: CVA  Objective:  Lab Results  Component Value Date   HGBA1C 7.1 (A) 01/24/2024    Lab Results  Component Value Date   CREATININE 0.83 10/24/2023   BUN 12 10/24/2023   NA 144 10/24/2023   K 3.3 (L) 10/24/2023   CL 102 10/24/2023   CO2 25 10/24/2023    Lab Results  Component Value Date   CHOL 113 01/24/2024   HDL 55 01/24/2024   LDLCALC 41 01/24/2024   TRIG 86 01/24/2024   CHOLHDL 2.1 01/24/2024    Medications Reviewed Today     Reviewed by Vela Prose, RPH (Pharmacist) on 03/03/24 at 1444  Med List Status: <None>   Medication Order Taking? Sig Documenting Provider Last Dose Status Informant  acetaminophen (TYLENOL) 500 MG tablet [id number] Yes Take 1,000 mg by mouth every 6 (six) hours as needed for moderate pain or headache. [provider] Taking Active Family Member           Med Note Particia Lather   Mon Aug 20, 2023  2:18 PM) PRN headaches  albuterol [facility] HFA) 108 (90 Base) MCG/ACT inhaler [id number] Yes INHALE 2 PUFFS BY MOUTH FOUR TIMES A DAY AS NEEDED FOR SHORTNESS OF BREATH. Ivonne Andrew, NP Taking Active   amLODipine (NORVASC) 10 MG tablet [id number] Yes Take 1 tablet (10 mg total) by mouth daily. Ivonne Andrew, NP Taking Active   baclofen (LIORESAL) 10 MG tablet [id number]  Yes Take 10 mg by mouth 3 (three) times daily. [provider] Taking Active            Med Note Colin Broach Mar 03, 2024  2:41 PM) Taking prn back spasms  cholecalciferol (VITAMIN D3) 25 MCG (1000 UT) tablet [id number] Yes Take 2,000 Units by mouth daily.  [provider] Taking Active Family Member  Cyanocobalamin (VITAMIN B 12 PO) [id number] Yes Take by mouth. [provider] Taking Active   glucose blood (ACCU-CHEK GUIDE) test strip [id number]  Use as instructed Kallie Locks, FNP  Active   hydrochlorothiazide (HYDRODIURIL) 25 MG tablet [id number] Yes Take 1 tablet (25 mg total) by mouth daily. Ivonne Andrew, NP Taking Active   Lancets (ACCU-CHEK MULTICLIX) lancets [id number]  USE TO CHECK BLOOD SUGAR TWICE DAILY Kallie Locks, FNP  Active   lisinopril (ZESTRIL) 20 MG tablet [id number] Yes Take 1 tablet (20 mg total) by mouth daily. Ivonne Andrew, NP Taking Active   metFORMIN (GLUCOPHAGE-XR) 500 MG 24 hr tablet [id number] Yes Take 1 tablet (500 mg total) by mouth 2 (two) times daily with a meal. Ivonne Andrew, NP Taking Active   Multiple Vitamins-Minerals (MULTIVITAMIN WITH MINERALS) tablet [id number] Yes Take 2 tablets by mouth daily.  [provider] Taking Active Family Member  polyethylene glycol (MIRALAX / GLYCOLAX) 17 g packet [id number] Yes Take 17 g by mouth daily as needed for mild constipation. Ivonne Andrew, NP Taking Active   Polyvinyl Alcohol-Povidone PF (REFRESH) 1.4-0.6 % SOLN [id number]  Place 2 drops into both eyes daily as needed.  Patient not taking: Reported on 01/24/2024   Orion Crook I, NP  Active            Med Note Sherilyn Cooter, [facility]   Mon Apr 16, 2023 11:45 AM) prn  rosuvastatin (CRESTOR) 20 MG tablet [id number] Yes Take 1 tablet (20 mg total) by mouth daily. Ivonne Andrew, NP Taking Active   Semaglutide,0.25 or 0.5MG /DOS, (OZEMPIC, 0.25 OR 0.5 MG/DOSE,) 2 MG/3ML SOPN [id number] Yes Inject 0.5 mg into the skin once a week. Ivonne Andrew, NP Taking Active               Assessment/Plan:   Diabetes: - Currently uncontrolled, but close to goal based on last A1c 7.1% on 01/24/24. Patient reported BG readings are at goal. Suspect with more time on Ozempic 0.5 mg, A1c will  further improve. - Reviewed long term cardiovascular and renal outcomes of uncontrolled blood sugar - Reviewed goal A1c, goal fasting, and goal 2 hour post prandial glucose - Recommend to continue Ozempic 0.5 mg weekly - Recommend to continue metformin XR 500 mg BID  - Patient denies personal or family history of multiple endocrine neoplasia type 2, medullary thyroid cancer; personal history of pancreatitis or gallbladder disease. - Recommend to check glucose daily - Patient is due for eye exam. Referral previously placed for Dr. Dione Booze, but patient reported when she called to schedule they had no appointments available. She is requesting a referral to a different ophthalmologist- will collaborate with PCP.     Hypertension: - Currently controlled with last clinic BP 128/70, at goal <130/80. She has a history of hypokalemia likely exacerbated by hydrochlorothiazide. Recommend to check BMP and if hypokalemia persists, would reduce hydrochlorothiazide to 12.5 mg daily given BP is controlled. Will coordinate to schedule lab appointment. - Reviewed long term cardiovascular and renal outcomes of uncontrolled blood pressure - Reviewed appropriate blood pressure  monitoring technique and reviewed goal blood pressure. Recommended to check home blood pressure and heart rate at least a few times weekly. - Recommend to continue lisinopril 20 mg daily, hydrochlorothiazide 25 mg daily, and amlodipine 10 mg daily      Hyperlipidemia/ASCVD Risk Reduction: - Currently controlled with last LDL 41 mg/dL now at goal <16 mg/dL given hx of CVA with risk factors (DM, HTN) since starting rosuvastatin 20 mg daily. - Reviewed long term complications of uncontrolled cholesterol - Recommend to continue rosuvastatin 20 mg daily     Medication Management - Appears patient is due for refills of maintenance medications. Counseled that she when she needs medications she should request refills from [facility] pharmacy and she  needs to provide payment information in order for meds to be shipped. Encouraged her to reach out to pharmacy to set up payment/shipping. Will message [facility] pharmacy requesting fills.    Follow Up Plan: PharmD 04/06/2024 and PCP on 04/24/2024  Jarrett Ables, PharmD PGY-1 Pharmacy Resident

## 2024-03-04 ENCOUNTER — Telehealth: Payer: Self-pay

## 2024-03-04 ENCOUNTER — Other Ambulatory Visit: Payer: Self-pay

## 2024-03-04 ENCOUNTER — Other Ambulatory Visit (HOSPITAL_COMMUNITY): Payer: Self-pay

## 2024-03-04 NOTE — Telephone Encounter (Signed)
 Called pt to advise she will need an appointment  for labs per [facility] pharmacy. Pt need b met  done .  Called no answer. KH

## 2024-03-24 ENCOUNTER — Other Ambulatory Visit: Payer: Self-pay

## 2024-03-24 ENCOUNTER — Other Ambulatory Visit: Payer: Self-pay | Admitting: Nurse Practitioner

## 2024-03-24 DIAGNOSIS — I10 Essential (primary) hypertension: Secondary | ICD-10-CM

## 2024-03-25 LAB — BASIC METABOLIC PANEL WITH GFR
BUN/Creatinine Ratio: 13 (ref 12–28)
BUN: 11 mg/dL (ref 8–27)
CO2: 27 mmol/L (ref 20–29)
Calcium: 9.5 mg/dL (ref 8.7–10.3)
Chloride: 102 mmol/L (ref 96–106)
Creatinine, Ser: 0.84 mg/dL (ref 0.57–1.00)
Glucose: 113 mg/dL — ABNORMAL HIGH (ref 70–99)
Potassium: 3.4 mmol/L — ABNORMAL LOW (ref 3.5–5.2)
Sodium: 142 mmol/L (ref 134–144)
eGFR: 77 mL/min/{1.73_m2} (ref >59)

## 2024-04-06 ENCOUNTER — Other Ambulatory Visit: Payer: Self-pay

## 2024-04-06 NOTE — Progress Notes (Unsigned)
 04/06/2024 Name: Candace Diaz MRN: [id number] DOB: Aug 19, 1956  Chief Complaint  Patient presents with   Diabetes   Hypertension   Hyperlipidemia    Candace Diaz is a 68 y.o. year old female who presented for a telephone visit.   They were referred to the pharmacist by their PCP for assistance in managing diabetes, hypertension, and hyperlipidemia.    Subjective: Patient reports she has been doing well since last visit. Says she was able to receive all her medications in the mail from [facility] which was great.  Care Team: Primary Care Provider: Jerrlyn Morel, NP ; Next Scheduled Visit: 04/24/24  Medication Access/Adherence  Current Pharmacy:  Maryan Smalling - [facility] [address] [place] [address] Phone: [phone] Fax: [phone]   Patient reports affordability concerns with their medications: No  Patient reports access/transportation concerns to their pharmacy: No  Patient reports adherence concerns with their medications:  No  - patient denies missed doses of medications. Using [facility] mail order pharmacy. Fill history is appropriate except rosuvastatin is overdue to be filled. Patient confirmed she still has a supply of this medication at home, although running low.   Diabetes:  Current medications: Ozempic 0.5 mg weekly (Wednesdays), metformin XR 500 mg BID  Patient reports she is tolerating medications well. Denies constipation, diarrhea, n/v.  Using glucometer; testing 2 times daily Fasting BG, ~8 AM: 90s-110s Before dinner, ~5 PM: 90s-110s Highest 130, Lowest 70s  Patient  hypoglycemic s/sx including dizziness, shakiness, sweating. Patient denies hyperglycemic symptoms including polyuria, polydipsia, polyphagia, nocturia, neuropathy, blurred vision.   Hypertension:  Current medications: lisinopril 20 mg daily, hydrochlorothiazide 25 mg daily, amlodipine 10 mg daily  Patient has a validated, automated, upper arm home BP  cuff Current blood pressure readings readings: checked once, but cannot recall reading. Plans to purchase a notebook to record readings in for next visit.  Patient denies hypotensive s/sx including dizziness, lightheadedness.  Patient denies hypertensive symptoms including headache, chest pain, shortness of breath   Hyperlipidemia/ASCVD Risk Reduction  Current lipid lowering medications: rosuvastatin 20 mg daily Medications tried in the past:  simvastatin (switched due to drug interaction with amlodipine)   ASCVD History: CVA   Objective:  Lab Results  Component Value Date   HGBA1C 7.1 (A) 01/24/2024    Lab Results  Component Value Date   CREATININE 0.84 03/24/2024   BUN 11 03/24/2024   NA 142 03/24/2024   K 3.4 (L) 03/24/2024   CL 102 03/24/2024   CO2 27 03/24/2024    Lab Results  Component Value Date   CHOL 113 01/24/2024   HDL 55 01/24/2024   LDLCALC 41 01/24/2024   TRIG 86 01/24/2024   CHOLHDL 2.1 01/24/2024    Medications Reviewed Today     Reviewed by Philmore Bream, RPH (Pharmacist) on 04/06/24 at 1107  Med List Status: <None>   Medication Order Taking? Sig Documenting Provider Last Dose Status Informant  acetaminophen (TYLENOL) 500 MG tablet [id number] Yes Take 1,000 mg by mouth every 6 (six) hours as needed for moderate pain or headache. [provider] Taking Active Family Member           Med Note Adra Alanis   Mon Aug 20, 2023  2:18 PM) PRN headaches  albuterol [facility] HFA) 108 (90 Base) MCG/ACT inhaler [id number] Yes INHALE 2 PUFFS BY MOUTH FOUR TIMES A DAY AS NEEDED FOR SHORTNESS OF BREATH. Jerrlyn Morel, NP Taking Active   amLODipine (NORVASC)  10 MG tablet [id number] Yes Take 1 tablet (10 mg total) by mouth daily. Jerrlyn Morel, NP Taking Active   baclofen (LIORESAL) 10 MG tablet [id number] Yes Take 10 mg by mouth 3 (three) times daily. [provider] Taking Active            Med Note Hoyt Macleod Mar 03, 2024   2:41 PM) Taking prn back spasms  cholecalciferol (VITAMIN D3) 25 MCG (1000 UT) tablet [id number] Yes Take 2,000 Units by mouth daily. [provider] Taking Active Family Member  Cyanocobalamin (VITAMIN B 12 PO) [id number] Yes Take by mouth. [provider] Taking Active   glucose blood (ACCU-CHEK GUIDE) test strip [id number]  Use as instructed Stroud, Natalie M, FNP  Active   hydrochlorothiazide (HYDRODIURIL) 25 MG tablet [id number] Yes Take 1 tablet (25 mg total) by mouth daily. Jerrlyn Morel, NP Taking Active   Lancets (ACCU-CHEK MULTICLIX) lancets [id number]  USE TO CHECK BLOOD SUGAR TWICE DAILY Stroud, Natalie M, FNP  Active   lisinopril (ZESTRIL) 20 MG tablet [id number] Yes Take 1 tablet (20 mg total) by mouth daily. Jerrlyn Morel, NP Taking Active   metFORMIN (GLUCOPHAGE-XR) 500 MG 24 hr tablet [id number] Yes Take 1 tablet (500 mg total) by mouth 2 (two) times daily with a meal. Jerrlyn Morel, NP Taking Active   Multiple Vitamins-Minerals (MULTIVITAMIN WITH MINERALS) tablet [id number] Yes Take 2 tablets by mouth daily.  [provider] Taking Active Family Member  polyethylene glycol (MIRALAX / GLYCOLAX) 17 g packet [id number] Yes Take 17 g by mouth daily as needed for mild constipation. Jerrlyn Morel, NP Taking Active   Polyvinyl Alcohol-Povidone PF (REFRESH) 1.4-0.6 % SOLN [id number] Yes Place 2 drops into both eyes daily as needed. Filbert Huff I, NP Taking Active            Med Note Ace Abu, [facility]   Mon Apr 16, 2023 11:45 AM) prn  rosuvastatin (CRESTOR) 20 MG tablet [id number] Yes Take 1 tablet (20 mg total) by mouth daily. Jerrlyn Morel, NP Taking Active   Semaglutide,0.25 or 0.5MG /DOS, (OZEMPIC, 0.25 OR 0.5 MG/DOSE,) 2 MG/3ML SOPN [id number] Yes Inject 0.5 mg into the skin once a week. Jerrlyn Morel, NP Taking Active               Assessment/Plan:   Diabetes: - Currently uncontrolled, but very close to goal with last A1c 7.1% on 01/24/24  and patient reported BG readings are at goal.  - Reviewed long term cardiovascular and renal outcomes of uncontrolled blood sugar - Reviewed goal A1c, goal fasting, and goal 2 hour post prandial glucose - Recommend to continue Ozempic 0.5 mg weekly - Recommend to continue metformin XR 500 mg BID  - Patient denies personal or family history of multiple endocrine neoplasia type 2, medullary thyroid cancer; personal history of pancreatitis or gallbladder disease. - Recommend to check fasting blood glucose daily - Patient again requested referral to a different ophthalmologist as she has not been able to get an appointment with Dr. Candi Chafe and has called several times. Recommended discussing at upcoming PCP visit. - A1c due at upcoming PCP visit     Hypertension: - Currently controlled with last several clinic BP readings 128/70, 126/68, 114/73, all at goal <130/80. She has a history of hypokalemia likely exacerbating by hydrochlorothiazide. Most recent potassium slightly improved, but still low. Given BP has been well controlled, recommend reducing hydrochlorothiazide to 12.5 mg daily and rechecking BMET at  upcoming PCP visit. Counseled patient to monitor BP daily at home and record readings to discuss at upcoming visit.  - Reviewed long term cardiovascular and renal outcomes of uncontrolled blood pressure - Reviewed appropriate blood pressure monitoring technique and reviewed goal blood pressure.  - Recommend to decrease hydrochlorothiazide to 12.5 mg daily - Recommend to continue amlodipine 10 mg daily - Recommend to continue lisinopril 20 mg daily  - F/u BMET at upcoming PCP visit     Hyperlipidemia/ASCVD Risk Reduction: - Currently controlled with last LDL 41 mg/dL at goal <91 mg/dL given hx of CVA with risk factors (DM, HTN). - Reviewed long term complications of uncontrolled cholesterol - Recommend to continue rosuvastatin 20 mg daily. Patient due for refill - messaged [facility] pharmacy to  request fill.   Follow Up Plan: PCP visit on 04/24/24 and PharmD on 05/20/24  Georga Killings, PharmD PGY-1 Pharmacy Resident

## 2024-04-07 ENCOUNTER — Telehealth: Payer: Self-pay | Admitting: Nurse Practitioner

## 2024-04-07 ENCOUNTER — Other Ambulatory Visit (HOSPITAL_COMMUNITY): Payer: Self-pay

## 2024-04-07 ENCOUNTER — Other Ambulatory Visit (HOSPITAL_BASED_OUTPATIENT_CLINIC_OR_DEPARTMENT_OTHER): Payer: Self-pay

## 2024-04-07 ENCOUNTER — Telehealth: Payer: Self-pay

## 2024-04-07 MED ORDER — HYDROCHLOROTHIAZIDE 12.5 MG PO TABS
12.5000 mg | ORAL_TABLET | Freq: Every day | ORAL | 3 refills | Status: DC
  Filled 2024-04-07: qty 90, 90d supply, fill #0
  Filled 2024-07-05: qty 90, 90d supply, fill #1
  Filled 2024-09-30: qty 90, 90d supply, fill #2

## 2024-04-07 NOTE — Telephone Encounter (Signed)
 Copied from CRM [id number]. Topic: Clinical - Lab/Test Results >> Apr 07, 2024 11:45 AM Jyl Or S wrote: Reason for CRM: Patient returning call from [facility] about labs please follow up with patient >> Apr 07, 2024 12:24 PM DeAngela L wrote: Patient returning phone call from [facility] about lab results informed Pt the office is at lunch until 1pm

## 2024-04-07 NOTE — Telephone Encounter (Signed)
 Copied from CRM [id number]. Topic: Clinical - Lab/Test Results >> Apr 07, 2024 10:34 AM Star [place] wrote: Reason for CRM: please call regarding lab results- [phone]

## 2024-04-08 NOTE — Telephone Encounter (Signed)
 Sent to [employer]

## 2024-04-24 ENCOUNTER — Ambulatory Visit: Payer: Self-pay | Admitting: Nurse Practitioner

## 2024-05-05 ENCOUNTER — Encounter: Payer: Self-pay | Admitting: Nurse Practitioner

## 2024-05-05 ENCOUNTER — Ambulatory Visit (INDEPENDENT_AMBULATORY_CARE_PROVIDER_SITE_OTHER): Payer: Self-pay | Admitting: Nurse Practitioner

## 2024-05-05 VITALS — BP 145/75 | HR 72 | Temp 97.7°F | Wt 216.2 lb

## 2024-05-05 DIAGNOSIS — E1100 Type 2 diabetes mellitus with hyperosmolarity without nonketotic hyperglycemic-hyperosmolar coma (NKHHC): Secondary | ICD-10-CM

## 2024-05-05 LAB — POCT GLYCOSYLATED HEMOGLOBIN (HGB A1C): Hemoglobin A1C: 6.9 % — AB (ref 4.0–5.6)

## 2024-05-05 NOTE — Progress Notes (Signed)
 Subjective   Patient ID: Candace Diaz, female    DOB: May 07, 1956, 68 y.o.   MRN: [id number]  Chief Complaint  Patient presents with   Diabetes    Follow up    Referring provider: Jerrlyn Morel, NP  Candace Diaz is a 68 y.o. 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 Symptoms: none. Patient denies hypoglycemia , increase appetite, and nausea.  Evaluation to date has been included: hemoglobin A1C. Treatment to date: no recent interventions. A1C in office today is improved at 6.9. Is following with pharmacy for diabetic medication management.    Hypertension:   Current medications: lisinopril  20 mg daily, hydrochlorothiazide  25 mg daily, amlodipine  10 mg daily  Denies f/c/s, n/v/d, hemoptysis, PND, leg swelling Denies chest pain or edema       Allergies  Allergen Reactions   Aspirin Other (See Comments)    Internal bleeding   Penicillins Hives    Did it involve swelling of the face/tongue/throat, SOB, or low BP? Unknown Did it involve sudden or severe rash/hives, skin peeling, or any reaction on the inside of your mouth or nose? Unknown Did you need to seek medical attention at a hospital or doctor's office? Unknown When did it last happen?      years If all above answers are "NO", may proceed with cephalosporin use.    Immunization History  Administered Date(s) Administered   Influenza,inj,Quad PF,6+ Mos 12/03/2014, 09/09/2015, 10/30/2016, 08/31/2017, 09/09/2018, 10/07/2021   Pneumococcal Conjugate-13 01/24/2016   Pneumococcal Polysaccharide-23 08/14/2014   Tdap 08/14/2014    Tobacco History: Social History   Tobacco Use  Smoking Status Former   Current  packs/day: 0.00   Types: Cigarettes   Quit date: 2000   Years since quitting: 25.3  Smokeless Tobacco Never   Counseling given: Not Answered   Outpatient Encounter Medications as of 05/05/2024  Medication Sig   acetaminophen  (TYLENOL ) 500 MG tablet Take 1,000 mg by mouth every 6 (six) hours as needed for moderate pain or headache.   albuterol  (PROAIR  HFA) 108 (90 Base) MCG/ACT inhaler INHALE 2 PUFFS BY MOUTH FOUR TIMES A DAY AS NEEDED FOR SHORTNESS OF BREATH.   amLODipine  (NORVASC ) 10 MG tablet Take 1 tablet (10 mg total) by mouth daily.   baclofen  (LIORESAL ) 10 MG tablet Take 10 mg by mouth 3 (three) times daily.   cholecalciferol  (VITAMIN D3) 25 MCG (1000 UT) tablet Take 2,000 Units by mouth daily.   Cyanocobalamin  (VITAMIN B 12 PO) Take by mouth.   glucose blood (ACCU-CHEK GUIDE) test strip Use as instructed   hydrochlorothiazide  (HYDRODIURIL ) 12.5 MG tablet Take 1 tablet (12.5 mg total) by mouth daily.   Lancets (ACCU-CHEK MULTICLIX) lancets USE TO CHECK BLOOD SUGAR TWICE DAILY   lisinopril  (ZESTRIL ) 20 MG tablet Take 1 tablet (20 mg total) by mouth daily.   metFORMIN  (GLUCOPHAGE -XR) 500 MG 24 hr tablet Take 1 tablet (500 mg total) by mouth 2 (two) times daily with a meal.   Multiple Vitamins-Minerals (MULTIVITAMIN WITH MINERALS) tablet Take 2 tablets by mouth daily.    polyethylene glycol (MIRALAX  / GLYCOLAX ) 17 g packet Take 17 g by mouth daily as needed for mild constipation.   Polyvinyl Alcohol -Povidone PF (  REFRESH) 1.4-0.6 % SOLN Place 2 drops into both eyes daily as needed.   rosuvastatin  (CRESTOR ) 20 MG tablet Take 1 tablet (20 mg total) by mouth daily.   Semaglutide ,0.25 or 0.5MG /DOS, (OZEMPIC , 0.25 OR 0.5 MG/DOSE,) 2 MG/3ML SOPN Inject 0.5 mg into the skin once a week.   No facility-administered encounter medications on file as of 05/05/2024.    Review of Systems  Review of Systems  Constitutional: Negative.   HENT: Negative.    Cardiovascular: Negative.    Gastrointestinal: Negative.   Allergic/Immunologic: Negative.   Neurological: Negative.   Psychiatric/Behavioral: Negative.       Objective:   BP (!) 145/75   Pulse 72   Temp 97.7 F (36.5 C) (Oral)   Wt 216 lb 3.2 oz (98.1 kg)   SpO2 98%   BMI 32.87 kg/m   Wt Readings from Last 5 Encounters:  05/05/24 216 lb 3.2 oz (98.1 kg)  01/24/24 213 lb (96.6 kg)  10/24/23 215 lb (97.5 kg)  08/16/23 225 lb (102.1 kg)  07/23/23 225 lb 12.8 oz (102.4 kg)     Physical Exam Vitals and nursing note reviewed.  Constitutional:      General: She is not in acute distress.    Appearance: She is well-developed.  Cardiovascular:     Rate and Rhythm: Normal rate and regular rhythm.  Pulmonary:     Effort: Pulmonary effort is normal.     Breath sounds: Normal breath sounds.  Neurological:     Mental Status: She is alert and oriented to person, place, and time.       Assessment & Plan:   Type 2 diabetes mellitus with hyperosmolarity without coma, unspecified whether long term insulin  use (HCC) -     POCT glycosylated hemoglobin (Hb A1C)     Return in about 3 months (around 08/05/2024).   Jerrlyn Morel, NP 05/05/2024

## 2024-05-05 NOTE — Patient Instructions (Signed)
 [facility] (accepts Medicaid, Medicare, [employer], and/or Self-Pay) [facility]  [address], [address],  [place], [place] [address] [phone]  [facility]  [address] [place], [place] [address] [phone]  [facility]  [facility] [place] [address] [place], [place] [address] *Located next to [facility]  [phone]  [facility]  [address] [place], [place] [address]  *Located next to [facility] [phone]  [facility]  [address] [place], [place] [address] [phone]  [facility]  [address] [place], [place] [address]  [phone] *Located inside [facility]  [address] [place], [place] [address] [phone]  [facility] [address] [place], [place] [address] [phone] [address] [place], [place] [address] [phone]  [facility] [address] [place], [place] [address] [phone]  [facility]  [address] [place], [place] [address] [phone]  [facility]  [address] [place], [place] [address] [phone]

## 2024-05-20 ENCOUNTER — Other Ambulatory Visit: Payer: Self-pay

## 2024-05-20 VITALS — BP 139/84

## 2024-05-20 DIAGNOSIS — I10 Essential (primary) hypertension: Secondary | ICD-10-CM

## 2024-05-20 NOTE — Progress Notes (Signed)
 05/20/2024 Name: Candace Diaz MRN: [id number] DOB: 03/30/1956  Chief Complaint  Patient presents with   Diabetes   Hypertension   Hyperlipidemia    Candace Diaz Flight is a 68 y.o. year old female who presented for a telephone visit.   They were referred to the pharmacist by their PCP for assistance in managing diabetes.    Subjective: Patient reports she is doing well today and has no concerns.  Care Team: Primary Care Provider: Jerrlyn Morel, NP ; Next Scheduled Visit: 08/06/24  Medication Access/Adherence  Current Pharmacy:  Maryan Smalling - [facility] [address] [place] [address] Phone: [phone] Fax: [phone]   Patient reports affordability concerns with their medications: No  Patient reports access/transportation concerns to their pharmacy: No  Patient reports adherence concerns with their medications:  No  - has had difficulty in the past refilling medications, but fill history currently appears appropriate   Diabetes:  Current medications: Ozempic  0.5 mg weekly, metformin  XR 500 mg BID  Using glucometer; testing every morning AM Fasting BG: ~112  Patient denies hypoglycemic s/sx including dizziness, shakiness, sweating. Patient denies hyperglycemic symptoms including polyuria, polydipsia, polyphagia, nocturia, neuropathy, blurred vision.   Hypertension:  Current medications: lisinopril  20 mg daily, amlodipine  10 mg daily, hydrochlorothiazide  12.5 mg daily Medications previously tried: hydrochlorothiazide  25 mg (hypokalemia)  Patient has a validated, automated, upper arm home BP cuff. She confirms proper BP check technique - rests in a seated position for several minutes prior to checking. No caffeine within 30 minutes of checking. She checks only occasionally and recalls this morning's BP reading was 139/84.  Patient denies hypotensive s/sx including dizziness, lightheadedness.  Patient denies hypertensive symptoms  including headache, chest pain, shortness of breath   Hyperlipidemia/ASCVD Risk Reduction  Current lipid lowering medications: rosuvastatin  20 mg daily Medications tried in the past: simvastatin  (switched d/t drug interaction with amlodipine )  ASCVD History: CVA  Objective:  Lab Results  Component Value Date   HGBA1C 6.9 (A) 05/05/2024    Lab Results  Component Value Date   CREATININE 0.84 03/24/2024   BUN 11 03/24/2024   NA 142 03/24/2024   K 3.4 (L) 03/24/2024   CL 102 03/24/2024   CO2 27 03/24/2024    Lab Results  Component Value Date   CHOL 113 01/24/2024   HDL 55 01/24/2024   LDLCALC 41 01/24/2024   TRIG 86 01/24/2024   CHOLHDL 2.1 01/24/2024    Medications Reviewed Today     Reviewed by Philmore Bream, RPH (Pharmacist) on 05/20/24 at 1738  Med List Status: <None>   Medication Order Taking? Sig Documenting Provider Last Dose Status Informant  acetaminophen  (TYLENOL ) 500 MG tablet [id number]  Take 1,000 mg by mouth every 6 (six) hours as needed for moderate pain or headache. [provider]  Active Family Member           Med Note Adra Alanis   Mon Aug 20, 2023  2:18 PM) PRN headaches  albuterol  (PROAIR  HFA) 108 (90 Base) MCG/ACT inhaler [id number]  INHALE 2 PUFFS BY MOUTH FOUR TIMES A DAY AS NEEDED FOR SHORTNESS OF BREATH. Jerrlyn Morel, NP  Active   amLODipine  (NORVASC ) 10 MG tablet [id number] Yes Take 1 tablet (10 mg total) by mouth daily. Jerrlyn Morel, NP Taking Active   baclofen  (LIORESAL ) 10 MG tablet [id number]  Take 10 mg by mouth 3 (three) times daily. [provider]  Active  Med Note Philmore Bream   Mon Mar 03, 2024  2:41 PM) Taking prn back spasms  cholecalciferol  (VITAMIN D3) 25 MCG (1000 UT) tablet [id number]  Take 2,000 Units by mouth daily. [provider]  Active Family Member  Cyanocobalamin  (VITAMIN B 12 PO) [id number]  Take by mouth. [provider]  Active   glucose blood (ACCU-CHEK  GUIDE) test strip [id number]  Use as instructed Stroud, Natalie M, FNP  Active   hydrochlorothiazide  (HYDRODIURIL ) 12.5 MG tablet [id number] Yes Take 1 tablet (12.5 mg total) by mouth daily. Jerrlyn Morel, NP Taking Active   Lancets (ACCU-CHEK MULTICLIX) lancets [id number]  USE TO CHECK BLOOD SUGAR TWICE DAILY Stroud, Natalie M, FNP  Active   lisinopril  (ZESTRIL ) 20 MG tablet [id number] Yes Take 1 tablet (20 mg total) by mouth daily. Jerrlyn Morel, NP Taking Active   metFORMIN  (GLUCOPHAGE -XR) 500 MG 24 hr tablet [id number] Yes Take 1 tablet (500 mg total) by mouth 2 (two) times daily with a meal. Jerrlyn Morel, NP Taking Active   Multiple Vitamins-Minerals (MULTIVITAMIN WITH MINERALS) tablet [id number]  Take 2 tablets by mouth daily.  [provider]  Active Family Member  polyethylene glycol (MIRALAX  / GLYCOLAX ) 17 g packet [id number]  Take 17 g by mouth daily as needed for mild constipation. Jerrlyn Morel, NP  Active   Polyvinyl Alcohol -Povidone PF (REFRESH) 1.4-0.6 % SOLN [id number]  Place 2 drops into both eyes daily as needed. Filbert Huff I, NP  Active            Med Note Ace Abu, [facility]   Mon Apr 16, 2023 11:45 AM) prn  rosuvastatin  (CRESTOR ) 20 MG tablet [id number] Yes Take 1 tablet (20 mg total) by mouth daily. Jerrlyn Morel, NP Taking Active   Semaglutide ,0.25 or 0.5MG /DOS, (OZEMPIC , 0.25 OR 0.5 MG/DOSE,) 2 MG/3ML SOPN [id number] Yes Inject 0.5 mg into the skin once a week. Jerrlyn Morel, NP Taking Active               Assessment/Plan:   Diabetes: - Currently controlled with last A1C 6.9%, below goal 7% on 05/05/24. - Reviewed long term cardiovascular and renal outcomes of uncontrolled blood sugar - Reviewed goal A1c, goal fasting, and goal 2 hour post prandial glucose - Recommend to continue Ozempic  0.5 mg weekly and metformin  XR 500 mg BID  - Patient denies personal or family history of multiple endocrine neoplasia type 2, medullary thyroid  cancer;  personal history of pancreatitis or gallbladder disease. - Recommend to check fasting blood glucose daily    Hypertension: - Currently uncontrolled with last office BP reading elevated at 145/75 and home BP reading 139/84. Recently decreased hydrochlorothiazide  as BP had been well controlled and she was experiencing hypokalemia. She did not get a BMET at recent in person office visit to assess for improvement and this is still due to be checked. Scheduled her for in person pharmacist follow up in 2 weeks for BP check and BMET. Can consider switching lisinopril  to a potent ARB given she is AA to lower risk of angioedema and maximize the dose for better BP control. - Reviewed long term cardiovascular and renal outcomes of uncontrolled blood pressure - Reviewed appropriate blood pressure monitoring technique and reviewed goal blood pressure.  - Recommended to check home blood pressure and heart rate daily and bring a log of readings to upcoming pharmacist visit. - Recommend to continue amlodipine  10 mg daily, hydrochlorothiazide  12.5 mg daily, lisinopril  20 mg daily  - F/u  BMET ordered     Hyperlipidemia/ASCVD Risk Reduction: - Currently controlled with last LDL 41 mg/dL, at goal <04 mg/dL given history of CVA with risk factors DM and HTN - Recommend to continue rosuvastatin  20 mg daily    Follow Up Plan: PharmD in person on 06/03/24 and PCP on 08/06/24  Georga Killings, PharmD PGY-1 Pharmacy Resident

## 2024-05-30 ENCOUNTER — Other Ambulatory Visit (HOSPITAL_COMMUNITY): Payer: Self-pay

## 2024-06-03 ENCOUNTER — Other Ambulatory Visit (HOSPITAL_COMMUNITY): Payer: Self-pay

## 2024-06-03 ENCOUNTER — Ambulatory Visit: Payer: Self-pay

## 2024-06-03 ENCOUNTER — Telehealth: Payer: Self-pay | Admitting: Nurse Practitioner

## 2024-06-03 NOTE — Telephone Encounter (Signed)
 Copied from CRM [id number]. Topic: General - Other >> Jun 03, 2024 10:13 AM Felizardo Hotter wrote: Reason for CRM: Pt called to reschedule her appt that pt had today with the pharmacist at 10:00 a.m. to another day. Please call her at [phone]

## 2024-06-03 NOTE — Progress Notes (Deleted)
 06/03/2024 Name: Candace Diaz MRN: [id number] DOB: 12-20-1956  No chief complaint on file.   Candace Diaz is a 68 y.o. year old female who presented for a face to face visit   They were referred to the pharmacist by their PCP for assistance in managing diabetes. PMH includes HTN, T2DM, OA of knee s/p TKA, hx of palpitations, HLD, CVA with residual deficit, BMI > 30.    Subjective: Patient was last seen by PCP, [name], NP on 05/05/24. BP was elevated to 145/75 mmHg, HR 72. A1C was 6.9% improved from 7.1%. At last pharmacist telephone appt on 05/05/24 - pt reported home BP of 139/84. It was noted that she had recently decreased hydrochlorothiazide  due to hypokalemia (K 3.4 mEq/L), but had not had a repeat BMET yet.   Today, ***  Care Team: Primary Care Provider: Jerrlyn Morel, NP ; Next Scheduled Visit: 08/06/24  Medication Access/Adherence  Current Pharmacy:  [facility] [address] [place] [address] Phone: [phone] Fax: [phone]   Patient reports affordability concerns with their medications: No  Patient reports access/transportation concerns to their pharmacy: No  Patient reports adherence concerns with their medications:  No  - has had difficulty in the past refilling medications, but fill history currently appears appropriate  [facility] Mail order?   Diabetes:  Current medications: Ozempic  0.5 mg weekly, metformin  XR 500 mg BID  Using glucometer; testing every morning AM Fasting BG: ~112  Patient denies hypoglycemic s/sx including dizziness, shakiness, sweating. Patient denies hyperglycemic symptoms including polyuria, polydipsia, polyphagia, nocturia, neuropathy, blurred vision.   Hypertension:  Current medications: lisinopril  20 mg daily, amlodipine  10 mg daily, hydrochlorothiazide  12.5 mg daily Medications previously tried: hydrochlorothiazide  25 mg (hypokalemia)  Patient has a validated,  automated, upper arm home BP cuff. She confirms proper BP check technique - rests in a seated position for several minutes prior to checking. No caffeine within 30 minutes of checking. She checks only occasionally and recalls this morning's BP reading was 139/84.  Patient denies hypotensive s/sx including dizziness, lightheadedness.  Patient denies hypertensive symptoms including headache, chest pain, shortness of breath   Hyperlipidemia/ASCVD Risk Reduction  Current lipid lowering medications: rosuvastatin  20 mg daily Medications tried in the past: simvastatin  (switched d/t drug interaction with amlodipine )  ASCVD History: CVA  Objective:  BP Readings from Last 3 Encounters:  05/20/24 139/84  05/05/24 (!) 145/75  01/24/24 128/70    Lab Results  Component Value Date   HGBA1C 6.9 (A) 05/05/2024   HGBA1C 7.1 (A) 01/24/2024   HGBA1C 7.7 (A) 10/24/2023    Lab Results  Component Value Date   CREATININE 0.84 03/24/2024   BUN 11 03/24/2024   NA 142 03/24/2024   K 3.4 (L) 03/24/2024   CL 102 03/24/2024   CO2 27 03/24/2024    Lab Results  Component Value Date   CHOL 113 01/24/2024   HDL 55 01/24/2024   LDLCALC 41 01/24/2024   TRIG 86 01/24/2024   CHOLHDL 2.1 01/24/2024    Medications Reviewed Today   Medications were not reviewed in this encounter       Assessment/Plan:   Diabetes: - Currently controlled with last A1C 6.9%, below goal 7% on 05/05/24. - Reviewed long term cardiovascular and renal outcomes of uncontrolled blood sugar - Reviewed goal A1c, goal fasting, and goal 2 hour post prandial glucose - Recommend to continue Ozempic  0.5 mg weekly and metformin  XR 500 mg BID  -  Patient denies personal or family history of multiple endocrine neoplasia type 2, medullary thyroid  cancer; personal history of pancreatitis or gallbladder disease. - Recommend to check fasting blood glucose daily    Hypertension: - Currently uncontrolled with last office BP reading  elevated at 145/75 and home BP reading 139/84. Recently decreased hydrochlorothiazide  as BP had been well controlled and she was experiencing hypokalemia. She did not get a BMET at recent in person office visit to assess for improvement and this is still due to be checked. Scheduled her for in person pharmacist follow up in 2 weeks for BP check and BMET. Can consider switching lisinopril  to a potent ARB given she is AA to lower risk of angioedema and maximize the dose for better BP control. - Reviewed long term cardiovascular and renal outcomes of uncontrolled blood pressure - Reviewed appropriate blood pressure monitoring technique and reviewed goal blood pressure.  - Recommended to check home blood pressure and heart rate daily and bring a log of readings to upcoming pharmacist visit. - Recommend to continue amlodipine  10 mg daily, hydrochlorothiazide  12.5 mg daily, lisinopril  20 mg daily  - F/u BMET ordered     Hyperlipidemia/ASCVD Risk Reduction: - Currently controlled with last LDL 41 mg/dL, at goal <56 mg/dL given history of CVA with risk factors DM and HTN - Recommend to continue rosuvastatin  20 mg daily    Follow Up Plan: PharmD in person on 06/03/24 and PCP on 08/06/24  Georga Killings, PharmD PGY-1 Pharmacy Resident

## 2024-06-10 ENCOUNTER — Other Ambulatory Visit: Payer: Self-pay

## 2024-06-10 ENCOUNTER — Ambulatory Visit (INDEPENDENT_AMBULATORY_CARE_PROVIDER_SITE_OTHER): Payer: Self-pay

## 2024-06-10 ENCOUNTER — Other Ambulatory Visit (HOSPITAL_COMMUNITY): Payer: Self-pay

## 2024-06-10 DIAGNOSIS — I10 Essential (primary) hypertension: Secondary | ICD-10-CM | POA: Diagnosis not present

## 2024-06-10 MED ORDER — LISINOPRIL 40 MG PO TABS
40.0000 mg | ORAL_TABLET | Freq: Every day | ORAL | 3 refills | Status: DC
  Filled 2024-06-10: qty 90, 90d supply, fill #0
  Filled 2024-09-17: qty 90, 90d supply, fill #1

## 2024-06-10 NOTE — Patient Instructions (Addendum)
 It was great to see you today.   For your blood sugars:  - Continue Ozempic  0.5 mg once weekly on Wednesdays - Continue metformin  XR 500 mg twice daily  For your blood pressure: - INCREASE lisinopril  to 40 mg daily. You can take 2 (two) 20 mg tablets once daily to use up your current supply. Then request the 40 mg tablet from the pharmacy when you run out.  - Continue hydrochlorothiazide  12.5 mg once daily - Continue amlodipine  10 mg daily  We will call you if there is anything abnormal on your lab work.   Please bring a log of your blood pressures and blood sugars to your next appointment.

## 2024-06-10 NOTE — Progress Notes (Signed)
 06/10/2024 Name: Candace Diaz MRN: [id number] DOB: Oct 09, 1956  Chief Complaint  Patient presents with   Diabetes   Hypertension   Hyperlipidemia    Candace Diaz is a 68 y.o. year old female who presented for a face to face visit. Patient presents with her granddaughter today. She is hard of hearing.   They were referred to the pharmacist by their PCP for assistance in managing diabetes. PMH includes HTN, T2DM, OA of knee s/p TKA, hx of palpitations, HLD, CVA with residual deficit, BMI > 30.    Subjective: Patient was last seen by PCP, [name], NP on 05/05/24. BP was elevated to 145/75 mmHg, HR 72. A1C improved to 6.9% from 7.1%.. At last pharmacist telephone appt on 05/05/24 - pt reported home BP of 139/84. It was noted that she had recently decreased hydrochlorothiazide  due to hypokalemia (K 3.4 mEq/L), but had not had a repeat BMET yet.   Today, patient reports doing well. She is taking all her medications as prescribed.   Care Team: Primary Care Provider: Jerrlyn Morel, NP ; Next Scheduled Visit: 08/06/24  Medication Access/Adherence  Current Pharmacy:  [facility] - [facility] [address] [place] [address] Phone: [phone] Fax: [phone]   Patient reports affordability concerns with their medications: No  Patient reports access/transportation concerns to their pharmacy: No  Patient reports adherence concerns with their medications:  No  - has had difficulty in the past refilling medications, but fill history currently appears appropriate. She is using [facility] [facility] for mail order.   Diabetes:  Current medications: Ozempic  0.5 mg weekly (Wednesdays), metformin  XR 500 mg BID  Using glucometer; testing every morning AM Fasting BG: average ~113  Patient denies hypoglycemic s/sx including dizziness, shakiness, sweating. Patient denies hyperglycemic symptoms including polyuria, polydipsia, polyphagia, nocturia,  neuropathy, blurred vision.   Hypertension:  Current medications: lisinopril  20 mg daily, amlodipine  10 mg daily, hydrochlorothiazide  12.5 mg daily Medications previously tried: hydrochlorothiazide  25 mg (hypokalemia)  Reports that she took her BP medications this morning ~9AM.  Patient has a validated, automated, upper arm home BP cuff. She confirms proper BP check technique - rests in a seated position for several minutes prior to checking. No caffeine within 30 minutes of checking. She checks only occasionally and cannot recall reading from this morning. SBP usually in the 140s and DBP usually in the 90s  Patient denies hypotensive s/sx including dizziness, lightheadedness.  Patient denies hypertensive symptoms including pain, shortness of breath. Patient endorses occasional headaches.   Hyperlipidemia/ASCVD Risk Reduction  Current lipid lowering medications: rosuvastatin  20 mg daily Medications tried in the past: simvastatin  (switched d/t drug interaction with amlodipine )  ASCVD History: CVA  Objective:  BP Readings from Last 3 Encounters:  06/10/24 (!) 143/82  05/20/24 139/84  05/05/24 (!) 145/75    Lab Results  Component Value Date   HGBA1C 6.9 (A) 05/05/2024   HGBA1C 7.1 (A) 01/24/2024   HGBA1C 7.7 (A) 10/24/2023   UACR 01/24/24: 23 mg/g  Lab Results  Component Value Date   CREATININE 0.84 03/24/2024   BUN 11 03/24/2024   NA 142 03/24/2024   K 3.4 (L) 03/24/2024   CL 102 03/24/2024   CO2 27 03/24/2024    Lab Results  Component Value Date   CHOL 113 01/24/2024   HDL 55 01/24/2024   LDLCALC 41 01/24/2024   TRIG 86 01/24/2024   CHOLHDL 2.1 01/24/2024    Medications Reviewed Today  Reviewed by Adra Alanis, RPH (Pharmacist) on 06/10/24 at 1555  Med List Status: <None>   Medication Order Taking? Sig Documenting Provider Last Dose Status Informant  acetaminophen  (TYLENOL ) 500 MG tablet [id number]  Take 1,000 mg by mouth every 6 (six) hours as needed  for moderate pain or headache. [provider]  Active Family Member           Med Note Adra Alanis   Mon Aug 20, 2023  2:18 PM) PRN headaches  albuterol  (PROAIR  HFA) 108 (90 Base) MCG/ACT inhaler [id number]  INHALE 2 PUFFS BY MOUTH FOUR TIMES A DAY AS NEEDED FOR SHORTNESS OF BREATH. Jerrlyn Morel, NP  Active   amLODipine  (NORVASC ) 10 MG tablet [id number] Yes Take 1 tablet (10 mg total) by mouth daily. Jerrlyn Morel, NP  Active   baclofen  (LIORESAL ) 10 MG tablet [id number]  Take 10 mg by mouth 3 (three) times daily. [provider]  Active            Med Note Hoyt Macleod Mar 03, 2024  2:41 PM) Taking prn back spasms  cholecalciferol  (VITAMIN D3) 25 MCG (1000 UT) tablet [id number]  Take 2,000 Units by mouth daily. [provider]  Active Family Member  Cyanocobalamin  (VITAMIN B 12 PO) [id number]  Take by mouth. [provider]  Active   glucose blood (ACCU-CHEK GUIDE) test strip [id number]  Use as instructed Stroud, Natalie M, FNP  Active   hydrochlorothiazide  (HYDRODIURIL ) 12.5 MG tablet [id number] Yes Take 1 tablet (12.5 mg total) by mouth daily. Jerrlyn Morel, NP  Active   Lancets (ACCU-CHEK MULTICLIX) lancets [id number]  USE TO CHECK BLOOD SUGAR TWICE DAILY Stroud, Natalie M, FNP  Active   lisinopril  (ZESTRIL ) 40 MG tablet [id number]  Take 1 tablet (40 mg total) by mouth daily. Jerrlyn Morel, NP  Active   metFORMIN  (GLUCOPHAGE -XR) 500 MG 24 hr tablet [id number] Yes Take 1 tablet (500 mg total) by mouth 2 (two) times daily with a meal. Nichols, Tonya S, NP  Active   Multiple Vitamins-Minerals (MULTIVITAMIN WITH MINERALS) tablet [id number]  Take 2 tablets by mouth daily.  [provider]  Active Family Member  polyethylene glycol (MIRALAX  / GLYCOLAX ) 17 g packet [id number]  Take 17 g by mouth daily as needed for mild constipation. Nichols, Tonya S, NP  Active   Polyvinyl Alcohol -Povidone PF (REFRESH) 1.4-0.6 % SOLN [id number]  Place 2 drops  into both eyes daily as needed. Filbert Huff I, NP  Active            Med Note Ace Abu, [facility]   Mon Apr 16, 2023 11:45 AM) prn  rosuvastatin  (CRESTOR ) 20 MG tablet [id number] Yes Take 1 tablet (20 mg total) by mouth daily. Jerrlyn Morel, NP  Active   Semaglutide ,0.25 or 0.5MG /DOS, (OZEMPIC , 0.25 OR 0.5 MG/DOSE,) 2 MG/3ML SOPN [id number]  Inject 0.5 mg into the skin once a week. Jerrlyn Morel, NP  Active               Assessment/Plan:   Diabetes: - Currently controlled with last A1C 6.9%, below goal 7% on 05/05/24. Patient is tolerating Ozempic  well. Will consider titrating to 1 mg weekly if glycemic control worsens. - Reviewed long term cardiovascular and renal outcomes of uncontrolled blood sugar - Reviewed goal A1c, goal fasting, and goal 2 hour post prandial glucose - Recommend to continue Ozempic  0.5 mg weekly and metformin  XR 500 mg BID  - Patient denies personal  or family history of multiple endocrine neoplasia type 2, medullary thyroid  cancer; personal history of pancreatitis or gallbladder disease. - Recommend to check fasting blood glucose daily   Hypertension: - Currently uncontrolled with office BP of 143/82 mmHg today. Patient is due for a repeat BMP today to assess resolution of hypokalemia after reducing dose of hydrochlorothiazide  to 12.5 mg daily. Pending BMP, will maximize ACEi today. Considered transitioning to ARB due to higher risk of angioedema with ACEi, but patient had just received a 90 day supply of lisinopril  20 mg tablets and she preferred to use her current supply. Patient may be a good candidate for triple combination tablet CCB-ARB-hydrochlorothiazide  in the future. - Reviewed long term cardiovascular and renal outcomes of uncontrolled blood pressure - Reviewed appropriate blood pressure monitoring technique and reviewed goal blood pressure.  - Recommended to check home blood pressure and heart rate daily and bring a log of readings to upcoming  appointments. - Recommend to continue amlodipine  10 mg daily, hydrochlorothiazide  12.5 mg daily - Recommend to increase lisinopril  to 40 mg daily. Instructed patient she can use up her current supply of lisinopril  20 mg tablets by taking 2 tablets once daily. - Repeat BMP to assess Scr and K in ~4 weeks after increasing ACEi    Hyperlipidemia/ASCVD Risk Reduction: - Currently controlled with last LDL 41 mg/dL, at goal <29 mg/dL given history of CVA with risk factors DM and HTN - Recommend to continue rosuvastatin  20 mg daily    Follow Up Plan: PharmD in person on 07/22/24 and PCP on 08/06/24  Arthea Larsson, PharmD PGY1 Pharmacy Resident

## 2024-06-11 ENCOUNTER — Ambulatory Visit: Payer: Self-pay

## 2024-06-11 LAB — BASIC METABOLIC PANEL WITH GFR
BUN/Creatinine Ratio: 18 (ref 12–28)
BUN: 13 mg/dL (ref 8–27)
CO2: 24 mmol/L (ref 20–29)
Calcium: 9.7 mg/dL (ref 8.7–10.3)
Chloride: 102 mmol/L (ref 96–106)
Creatinine, Ser: 0.73 mg/dL (ref 0.57–1.00)
Glucose: 110 mg/dL — ABNORMAL HIGH (ref 70–99)
Potassium: 3.6 mmol/L (ref 3.5–5.2)
Sodium: 144 mmol/L (ref 134–144)
eGFR: 90 mL/min/{1.73_m2} (ref >59)

## 2024-06-16 ENCOUNTER — Telehealth: Payer: Self-pay

## 2024-06-16 NOTE — Telephone Encounter (Signed)
 Pt wants to know why is her BP going up and down so much and wants to know what labs will explain this. Pt reported the note MD had left regarding recent lab work.  Pt reports BP is good today, but wants to know why it keeps going back up and down.  Pt states will wait until her next appmt to talk to her MD.   Copied from CRM [id number]. Topic: Clinical - Lab/Test Results >> Jun 16, 2024 10:45 AM Candace Diaz wrote: Reason for CRM: Pt called for lab results

## 2024-06-17 ENCOUNTER — Telehealth: Payer: Self-pay | Admitting: Nurse Practitioner

## 2024-06-17 NOTE — Telephone Encounter (Signed)
 Pt returned called back for answer to why bp is going up and down and if her med will be increased

## 2024-07-05 ENCOUNTER — Other Ambulatory Visit (HOSPITAL_COMMUNITY): Payer: Self-pay

## 2024-07-17 ENCOUNTER — Other Ambulatory Visit: Payer: Self-pay | Admitting: Nurse Practitioner

## 2024-07-17 DIAGNOSIS — Z1231 Encounter for screening mammogram for malignant neoplasm of breast: Secondary | ICD-10-CM

## 2024-07-22 ENCOUNTER — Ambulatory Visit: Payer: Self-pay

## 2024-07-24 DIAGNOSIS — E113291 Type 2 diabetes mellitus with mild nonproliferative diabetic retinopathy without macular edema, right eye: Secondary | ICD-10-CM | POA: Diagnosis not present

## 2024-07-24 DIAGNOSIS — Z961 Presence of intraocular lens: Secondary | ICD-10-CM | POA: Diagnosis not present

## 2024-07-24 DIAGNOSIS — Z794 Long term (current) use of insulin: Secondary | ICD-10-CM | POA: Diagnosis not present

## 2024-07-24 DIAGNOSIS — H40013 Open angle with borderline findings, low risk, bilateral: Secondary | ICD-10-CM | POA: Diagnosis not present

## 2024-07-24 DIAGNOSIS — E113212 Type 2 diabetes mellitus with mild nonproliferative diabetic retinopathy with macular edema, left eye: Secondary | ICD-10-CM | POA: Diagnosis not present

## 2024-07-24 DIAGNOSIS — H10413 Chronic giant papillary conjunctivitis, bilateral: Secondary | ICD-10-CM | POA: Diagnosis not present

## 2024-07-28 ENCOUNTER — Other Ambulatory Visit (HOSPITAL_COMMUNITY): Payer: Self-pay

## 2024-07-30 ENCOUNTER — Other Ambulatory Visit (HOSPITAL_BASED_OUTPATIENT_CLINIC_OR_DEPARTMENT_OTHER): Payer: Self-pay

## 2024-07-30 ENCOUNTER — Other Ambulatory Visit (HOSPITAL_COMMUNITY): Payer: Self-pay

## 2024-07-30 ENCOUNTER — Ambulatory Visit (INDEPENDENT_AMBULATORY_CARE_PROVIDER_SITE_OTHER): Payer: Self-pay

## 2024-07-30 VITALS — BP 134/90

## 2024-07-30 DIAGNOSIS — I10 Essential (primary) hypertension: Secondary | ICD-10-CM

## 2024-07-30 MED ORDER — AMLODIPINE-VALSARTAN-HCTZ 10-320-25 MG PO TABS
1.0000 | ORAL_TABLET | Freq: Every day | ORAL | 0 refills | Status: DC
  Filled 2024-07-30: qty 30, 30d supply, fill #0

## 2024-07-30 NOTE — Progress Notes (Signed)
 07/30/2024 Name: Candace Diaz MRN: [id number] DOB: 01-Apr-1956  Chief Complaint  Patient presents with   Diabetes   Hypertension   Hyperlipidemia    Candace Diaz is a 68 y.o. year old female who presented for a face to face visit. She is hard of hearing. She presents alone today.   They were referred to the pharmacist by their PCP for assistance in managing diabetes. PMH includes HTN, T2DM, OA of knee s/p TKA, hx of palpitations, HLD, CVA with residual deficit, BMI > 30.    Subjective: Patient was last seen by PCP, Candace Borer, NP on 05/05/24. BP was elevated to 145/75 mmHg, HR 72. A1C improved to 6.9% from 7.1%.. At last pharmacist telephone appt on 05/05/24 - pt reported home BP of 139/84. It was noted that she had recently decreased hydrochlorothiazide  due to hypokalemia (K 3.4 mEq/L), but had not had a repeat BMET yet. Patient was last seen by pharmacy in[place] on 06/10/24. Her BP was 143/82 mmHg. Her K had normalized on he repeat BMP. She was instructed to increase lisinopril  to 40 mg daily.   Today, patient reports doing well. She brings her medications with her today and a list of 3 blood pressures from this past week. Her Ozempic  was last filled 03/04/24 - but she reports she still has a current supply. She agreed that I could request refills from the pharmacy.   Care Team: Primary Care Provider: Borer Candace RAMAN, NP ; Next Scheduled Visit: 08/14/24  Medication Access/Adherence  Current Pharmacy:  Candace Diaz - [facility] [address] [place] [address] Phone: [phone] Fax: [phone]   Patient reports affordability concerns with their medications: No  Patient reports access/transportation concerns to their pharmacy: No  Patient reports adherence concerns with their medications:  No  - has had difficulty in the past refilling medications, but fill history currently appears appropriate, except for Ozempic . She is using [employer]  for mail order.   Diabetes:  Current medications: Ozempic  0.5 mg weekly (Wednesdays, last filled 03/04/24 - patient reports she is not out yet. Denies missed doses), metformin  XR 500 mg BID  Using glucometer; testing every morning  AM Fasting BG today ~114 mg/dL. She does not have her glucometer with her.   Patient denies hypoglycemic s/sx including dizziness, shakiness, sweating. Patient denies hyperglycemic symptoms including polyuria, polydipsia, polyphagia, nocturia, neuropathy, blurred vision.   Hypertension:  Current medications: lisinopril  40 mg daily, amlodipine  10 mg daily, hydrochlorothiazide  12.5 mg daily Medications previously tried: hydrochlorothiazide  25 mg (hypokalemia)  Reports this morning she only took lisinopril  40 mg daily because she did not eat yet (because she was coming to her appt). She usually takes all her medications in the morning with breakfast.   Patient has an automated, upper arm home BP cuff. Reports that it has not been validated in clinic. She confirms proper BP check technique - rests in a seated position for several minutes prior to checking. No caffeine within 30 minutes of checking.   Patient-reported home BP:  07/28/24: 140/85, HR 62 07/29/24: 145/82, HR 68 07/30/24: 144/88, HR 57  Patient denies hypotensive s/sx including dizziness, lightheadedness.  Patient denies hypertensive symptoms including pain, shortness of breath. Patient endorses occasional headaches.   Hyperlipidemia/ASCVD Risk Reduction  Current lipid lowering medications: rosuvastatin  20 mg daily Medications tried in the past: simvastatin  (switched d/t drug interaction with amlodipine )  ASCVD History: CVA  Objective:  BP Readings from Last 3 Encounters:  07/30/24 Candace Diaz)  134/90  06/10/24 (!) 143/82  05/20/24 139/84    Lab Results  Component Value Date   HGBA1C 6.9 (A) 05/05/2024   HGBA1C 7.1 (A) 01/24/2024   HGBA1C 7.7 (A) 10/24/2023   UACR 01/24/24: 23 mg/g  Lab Results   Component Value Date   CREATININE 0.73 06/10/2024   BUN 13 06/10/2024   NA 144 06/10/2024   K 3.6 06/10/2024   CL 102 06/10/2024   CO2 24 06/10/2024    Lab Results  Component Value Date   CHOL 113 01/24/2024   HDL 55 01/24/2024   LDLCALC 41 01/24/2024   TRIG 86 01/24/2024   CHOLHDL 2.1 01/24/2024    Medications Reviewed Today     Reviewed by Candace Diaz, RPH (Pharmacist) on 07/30/24 at 1505  Med List Status: <None>   Medication Order Taking? Sig Documenting Provider Last Dose Status Informant  acetaminophen  (TYLENOL ) 500 MG tablet [id number]  Take 1,000 mg by mouth every 6 (six) hours as needed for moderate pain or headache. [provider]  Active Family Member           Med Note Candace Diaz   Mon Aug 20, 2023  2:18 PM) PRN headaches  albuterol  (PROAIR  HFA) 108 (90 Base) MCG/ACT inhaler [id number] Yes INHALE 2 PUFFS BY MOUTH FOUR TIMES A DAY AS NEEDED FOR SHORTNESS OF BREATH. Candace Candace RAMAN, NP  Active   amLODipine  (NORVASC ) 10 MG tablet [id number] Yes Take 1 tablet (10 mg total) by mouth daily. Candace Candace RAMAN, NP  Active   baclofen  (LIORESAL ) 10 MG tablet [id number]  Take 10 mg by mouth 3 (three) times daily. [provider]  Active            Med Note Candace Diaz Mar 03, 2024  2:41 PM) Taking prn back spasms  cholecalciferol  (VITAMIN D3) 25 MCG (1000 UT) tablet [id number]  Take 2,000 Units by mouth daily. [provider]  Active Family Member  Cyanocobalamin  (VITAMIN B 12 PO) [id number]  Take by mouth. [provider]  Active   glucose blood (ACCU-CHEK GUIDE) test strip [id number]  Use as instructed Diaz, Candace Diaz  Active   hydrochlorothiazide  (HYDRODIURIL ) 12.5 MG tablet [id number] Yes Take 1 tablet (12.5 mg total) by mouth daily. Candace Candace RAMAN, NP  Active   Lancets (ACCU-CHEK MULTICLIX) lancets [id number]  USE TO CHECK BLOOD SUGAR TWICE DAILY Diaz, Candace Diaz  Active   lisinopril  (ZESTRIL ) 40 MG tablet [id number]  Yes Take 1 tablet (40 mg total) by mouth daily. Candace Candace RAMAN, NP  Active   metFORMIN  (GLUCOPHAGE -XR) 500 MG 24 hr tablet [id number] Yes Take 1 tablet (500 mg total) by mouth 2 (two) times daily with a meal. Nichols, Tonya S, NP  Active   Multiple Vitamins-Minerals (MULTIVITAMIN WITH MINERALS) tablet [id number]  Take 2 tablets by mouth daily.  [provider]  Active Family Member  polyethylene glycol (MIRALAX  / GLYCOLAX ) 17 g packet [id number]  Take 17 g by mouth daily as needed for mild constipation. Candace Candace RAMAN, NP  Active   Polyvinyl Alcohol -Povidone PF (REFRESH) 1.4-0.6 % SOLN [id number]  Place 2 drops into both eyes daily as needed. Shannan Sia I, NP  Active            Med Note SAMULE, [facility]   Mon Apr 16, 2023 11:45 AM) prn  rosuvastatin  (CRESTOR ) 20 MG tablet [id number] Yes Take 1 tablet (20 mg total) by mouth daily. Candace Candace RAMAN, NP  Active   Semaglutide ,0.25 or 0.5MG /DOS, (OZEMPIC , 0.25 OR 0.5 MG/DOSE,) 2 MG/3ML SOPN [id number] Yes Inject 0.5 mg into the skin once a week. Candace Candace RAMAN, NP  Active               Assessment/Plan:   Diabetes: - Currently controlled with last A1C 6.9%, below goal 7% on 05/05/24. Patient is tolerating Ozempic  well. Will consider titrating to 1 mg weekly if glycemic control worsens.  - Reviewed long term cardiovascular and renal outcomes of uncontrolled blood sugar - Reviewed goal A1c, goal fasting, and goal 2 hour post prandial glucose - Recommend to continue Ozempic  0.5 mg weekly and metformin  XR 500 mg BID.  - Requested refill of Ozempic  from [facility] pharmacy.  - Patient denies personal or family history of multiple endocrine neoplasia type 2, medullary thyroid  cancer; personal history of pancreatitis or gallbladder disease. - Recommend to check fasting blood glucose daily and bring glucometer to upcoming appointments   Hypertension: - Currently uncontrolled with office BP of 134/90 mmHg today, but patient has only taken her  lisinopril  this AM. Anticipate that she would be at goal if she had taken all her medications this morning. Her home BPs are elevated, but her cuff has not been validated. Patient is not interested in adding additional medications. HR is in the 60s, so would be hesitant to start beta-blocker, like carvedilol, given risk of bradycardia. Patient is due for a repeat BMP today to assess Scr and K after increasing lisinopril  ~ 2 mo ago.  Would like to transition to triple combination tablet with CCB-ARB-hydrochlorothiazide , but this is currently rejected by her insurance since her other medications have been filled recently. Will collaborate with the pharmacy to troubleshoot coverage.  - Reviewed long term cardiovascular and renal outcomes of uncontrolled blood pressure - Reviewed appropriate blood pressure monitoring technique and reviewed goal blood pressure.  - Recommended to check home blood pressure and heart rate daily and bring a log of readings to upcoming appointments.Encouraged patient to bring her BP device to her upcoming appointments. - Recommend to continue amlodipine  10 mg daily, hydrochlorothiazide  12.5 mg daily, and lisinopril  40 mg daily - Will investigate coverage of triple combination tablet for improved adherence. If approved, can transition to this at next appt. - Obtained BMP today    Hyperlipidemia/ASCVD Risk Reduction: - Currently controlled with last LDL 41 mg/dL, at goal <44 mg/dL given history of CVA with risk factors DM and HTN - Recommend to continue rosuvastatin  20 mg daily    Follow Up Plan: PCP 08/14/24, PharmD 09/17/24  Lorain Baseman, PharmD [facility] [phone]

## 2024-07-30 NOTE — Patient Instructions (Addendum)
 It was nice to see you today!  Medication Changes: Continue all your blood pressure medications. Do your best not to miss doses - Amlodipine  10 mg daily - Lisinopril  40 mg daily - Hydrochlorothiazide  12.5 mg daily  Continue your diabetes and cholesterol medications - Ozempic  0.5 mg once weekly - Metformin  XR 500 mg twice daily - Rosuvastatin  20 mg daily  Monitor blood sugars at home and keep a log (glucometer or piece of paper) to bring with you to your next visit. Your goal blood sugar is 80-130 before eating and less than 180 after eating.  Bring your blood pressure cuff to your next visit.  Keep up the good work with diet and exercise. Aim for a diet full of vegetables, fruit and lean meats (chicken, [place], fish). Try to limit salt intake by eating fresh or frozen vegetables (instead of canned), rinse canned vegetables prior to cooking and do not add any additional salt to meals.

## 2024-07-31 ENCOUNTER — Ambulatory Visit: Payer: Self-pay

## 2024-07-31 LAB — BASIC METABOLIC PANEL WITH GFR
BUN/Creatinine Ratio: 16 (ref 12–28)
BUN: 14 mg/dL (ref 8–27)
CO2: 24 mmol/L (ref 20–29)
Calcium: 10.1 mg/dL (ref 8.7–10.3)
Chloride: 100 mmol/L (ref 96–106)
Creatinine, Ser: 0.86 mg/dL (ref 0.57–1.00)
Glucose: 106 mg/dL — ABNORMAL HIGH (ref 70–99)
Potassium: 3.7 mmol/L (ref 3.5–5.2)
Sodium: 143 mmol/L (ref 134–144)
eGFR: 74 mL/min/1.73 (ref >59)

## 2024-08-05 ENCOUNTER — Ambulatory Visit: Payer: Self-pay | Admitting: Nurse Practitioner

## 2024-08-06 ENCOUNTER — Ambulatory Visit: Payer: Self-pay | Admitting: Nurse Practitioner

## 2024-08-14 ENCOUNTER — Ambulatory Visit (INDEPENDENT_AMBULATORY_CARE_PROVIDER_SITE_OTHER): Payer: Self-pay | Admitting: Nurse Practitioner

## 2024-08-14 ENCOUNTER — Other Ambulatory Visit (HOSPITAL_COMMUNITY): Payer: Self-pay

## 2024-08-14 ENCOUNTER — Other Ambulatory Visit: Payer: Self-pay

## 2024-08-14 ENCOUNTER — Encounter: Payer: Self-pay | Admitting: Nurse Practitioner

## 2024-08-14 VITALS — BP 137/74 | HR 86 | Temp 97.2°F | Wt 213.0 lb

## 2024-08-14 DIAGNOSIS — E1100 Type 2 diabetes mellitus with hyperosmolarity without nonketotic hyperglycemic-hyperosmolar coma (NKHHC): Secondary | ICD-10-CM | POA: Diagnosis not present

## 2024-08-14 DIAGNOSIS — K029 Dental caries, unspecified: Secondary | ICD-10-CM | POA: Diagnosis not present

## 2024-08-14 DIAGNOSIS — H9193 Unspecified hearing loss, bilateral: Secondary | ICD-10-CM | POA: Diagnosis not present

## 2024-08-14 LAB — POCT GLYCOSYLATED HEMOGLOBIN (HGB A1C): Hemoglobin A1C: 7.1 % — AB (ref 4.0–5.6)

## 2024-08-14 MED ORDER — BACLOFEN 10 MG PO TABS
10.0000 mg | ORAL_TABLET | Freq: Three times a day (TID) | ORAL | 2 refills | Status: DC
  Filled 2024-08-14: qty 30, 10d supply, fill #0

## 2024-08-14 NOTE — Progress Notes (Signed)
 Subjective   Patient ID: Candace Diaz, female    DOB: [birth year], 68 y.o.   MRN: [id number]  Chief Complaint  Patient presents with   Diabetes    Referring provider: Oley Bascom RAMAN, NP  Candace Diaz is a 68 y.o. 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 Symptoms: none. Patient denies hypoglycemia , increase appetite, and nausea.  Evaluation to date has been included: hemoglobin A1C. Treatment to date: no recent interventions. A1C in office today is improved at 7.1. Is following with pharmacy for diabetic medication management.    Hypertension:   Current medications: lisinopril  20 mg daily, hydrochlorothiazide  25 mg daily, amlodipine  10 mg daily  Needs referrals to audiology and dentist   Denies f/c/s, n/v/d, hemoptysis, PND, leg swelling Denies chest pain or edema    Allergies  Allergen Reactions   Aspirin Other (See Comments)    Internal bleeding   Penicillins Hives    Did it involve swelling of the face/tongue/throat, SOB, or low BP? Unknown Did it involve sudden or severe rash/hives, skin peeling, or any reaction on the inside of your mouth or nose? Unknown Did you need to seek medical attention at a hospital or doctor's office? Unknown When did it last happen?      years If all above answers are "NO", may proceed with cephalosporin use.    Immunization History  Administered Date(s) Administered   Influenza,inj,Quad PF,6+ Mos 12/03/2014, 09/09/2015, 10/30/2016, 08/31/2017, 09/09/2018, 10/07/2021   Pneumococcal Conjugate-13 01/24/2016   Pneumococcal Polysaccharide-23 08/14/2014   Tdap 08/14/2014    Tobacco History: Social History   Tobacco Use  Smoking  Status Former   Current packs/day: 0.00   Types: Cigarettes   Quit date: 2000   Years since quitting: 25.6  Smokeless Tobacco Never   Counseling given: Not Answered   Outpatient Encounter Medications as of 08/14/2024  Medication Sig   acetaminophen  (TYLENOL ) 500 MG tablet Take 1,000 mg by mouth every 6 (six) hours as needed for moderate pain or headache.   albuterol  (PROAIR  HFA) 108 (90 Base) MCG/ACT inhaler INHALE 2 PUFFS BY MOUTH FOUR TIMES A DAY AS NEEDED FOR SHORTNESS OF BREATH.   amLODipine  (NORVASC ) 10 MG tablet Take 1 tablet (10 mg total) by mouth daily.   cholecalciferol  (VITAMIN D3) 25 MCG (1000 UT) tablet Take 2,000 Units by mouth daily.   Cyanocobalamin  (VITAMIN B 12 PO) Take by mouth.   glucose blood (ACCU-CHEK GUIDE) test strip Use as instructed   hydrochlorothiazide  (HYDRODIURIL ) 12.5 MG tablet Take 1 tablet (12.5 mg total) by mouth daily.   Lancets (ACCU-CHEK MULTICLIX) lancets USE TO CHECK BLOOD SUGAR TWICE DAILY   lisinopril  (ZESTRIL ) 40 MG tablet Take 1 tablet (40 mg total) by mouth daily.   metFORMIN  (GLUCOPHAGE -XR) 500 MG 24 hr tablet Take 1 tablet (500 mg total) by mouth 2 (two) times daily with a meal.   Multiple Vitamins-Minerals (MULTIVITAMIN WITH MINERALS) tablet Take 2 tablets by mouth daily.    polyethylene glycol (MIRALAX  / GLYCOLAX ) 17 g packet Take 17 g by mouth daily as needed for mild constipation.   Polyvinyl Alcohol -Povidone PF (REFRESH) 1.4-0.6 % SOLN Place 2 drops into both eyes daily as needed.   rosuvastatin  (  CRESTOR ) 20 MG tablet Take 1 tablet (20 mg total) by mouth daily.   Semaglutide ,0.25 or 0.5MG /DOS, (OZEMPIC , 0.25 OR 0.5 MG/DOSE,) 2 MG/3ML SOPN Inject 0.5 mg into the skin once a week.   baclofen  (LIORESAL ) 10 MG tablet Take 1 tablet (10 mg total) by mouth 3 (three) times daily.   [DISCONTINUED] amLODIPine -Valsartan -HCTZ 10-320-25 MG TABS Take 1 tablet by mouth daily.   [DISCONTINUED] baclofen  (LIORESAL ) 10 MG tablet Take 10 mg by mouth 3  (three) times daily.   No facility-administered encounter medications on file as of 08/14/2024.    Review of Systems  Review of Systems  Constitutional: Negative.   HENT: Negative.    Cardiovascular: Negative.   Gastrointestinal: Negative.   Allergic/Immunologic: Negative.   Neurological: Negative.   Psychiatric/Behavioral: Negative.       Objective:   BP 137/74   Pulse 86   Temp (!) 97.2 F (36.2 C)   Wt 213 lb (96.6 kg)   SpO2 94%   BMI 32.39 kg/m   Wt Readings from Last 5 Encounters:  08/14/24 213 lb (96.6 kg)  05/05/24 216 lb 3.2 oz (98.1 kg)  01/24/24 213 lb (96.6 kg)  10/24/23 215 lb (97.5 kg)  08/16/23 225 lb (102.1 kg)     Physical Exam Vitals and nursing note reviewed.  Constitutional:      General: She is not in acute distress.    Appearance: She is well-developed.  Cardiovascular:     Rate and Rhythm: Normal rate and regular rhythm.  Pulmonary:     Effort: Pulmonary effort is normal.     Breath sounds: Normal breath sounds.  Neurological:     Mental Status: She is alert and oriented to person, place, and time.       Assessment & Plan:   Type 2 diabetes mellitus with hyperosmolarity without coma, unspecified whether long term insulin  use (HCC) -     POCT glycosylated hemoglobin (Hb A1C)  Decreased hearing of both ears -     Ambulatory referral to Audiology  Dental caries -     Ambulatory referral to Dentistry  Other orders -     Baclofen ; Take 1 tablet (10 mg total) by mouth 3 (three) times daily.  Dispense: 30 each; Refill: 2     Return in about 3 months (around 11/14/2024).   Bascom GORMAN Borer, NP 08/14/2024

## 2024-08-21 ENCOUNTER — Ambulatory Visit (INDEPENDENT_AMBULATORY_CARE_PROVIDER_SITE_OTHER): Payer: Self-pay

## 2024-08-21 VITALS — Ht 68.0 in | Wt 213.0 lb

## 2024-08-21 DIAGNOSIS — Z0000 Encounter for general adult medical examination without abnormal findings: Secondary | ICD-10-CM

## 2024-08-21 NOTE — Progress Notes (Signed)
 Subjective:   Aubryana Mae Forrer is a 68 y.o. female who presents for Medicare Annual (Subsequent) preventive examination.  Visit Complete: Virtual I connected with  Candace Diaz on 08/21/24 by a audio enabled telemedicine application and verified that I am speaking with the correct person using two identifiers.  Patient Location: Home  Provider Location: Office/Clinic  I discussed the limitations of evaluation and management by telemedicine. The patient expressed understanding and agreed to proceed.  Vital Signs: Because this visit was a virtual/telehealth visit, some criteria may be missing or patient reported. Any vitals not documented were not able to be obtained and vitals that have been documented are patient reported.  Patient Medicare AWV questionnaire was completed by the patient on 08/21/24; I have confirmed that all information answered by patient is correct and no changes since this date.  Cardiac Risk Factors include: advanced age (>53men, >81 women);diabetes mellitus;hypertension;obesity (BMI >30kg/m2)     Objective:    Today's Vitals   08/21/24 1110  Weight: 213 lb (96.6 kg)  Height: 5' 8 (1.727 m)   Body mass index is 32.39 kg/m.     08/21/2024   11:24 AM 08/16/2023   12:54 PM 12/02/2019   10:15 AM 10/28/2019   11:45 AM 10/22/2019    1:59 PM 09/18/2017    9:45 AM 01/20/2017    6:13 PM  Advanced Directives  Does Patient Have a Medical Advance Directive? No No No No No No  No   Would patient like information on creating a medical advance directive? No - Patient declined No - Patient declined No - Patient declined No - Patient declined Yes (MAU/Ambulatory/Procedural Areas - Information given) No - Patient declined  No - Patient declined      Data saved with a previous flowsheet row definition    Current Medications (verified) Outpatient Encounter Medications as of 08/21/2024  Medication Sig   acetaminophen  (TYLENOL ) 500 MG tablet Take 1,000 mg by mouth every 6  (six) hours as needed for moderate pain or headache.   albuterol  (PROAIR  HFA) 108 (90 Base) MCG/ACT inhaler INHALE 2 PUFFS BY MOUTH FOUR TIMES A DAY AS NEEDED FOR SHORTNESS OF BREATH.   amLODipine  (NORVASC ) 10 MG tablet Take 1 tablet (10 mg total) by mouth daily.   baclofen  (LIORESAL ) 10 MG tablet Take 1 tablet (10 mg total) by mouth 3 (three) times daily.   cholecalciferol  (VITAMIN D3) 25 MCG (1000 UT) tablet Take 2,000 Units by mouth daily.   Cyanocobalamin  (VITAMIN B 12 PO) Take by mouth.   glucose blood (ACCU-CHEK GUIDE) test strip Use as instructed   hydrochlorothiazide  (HYDRODIURIL ) 12.5 MG tablet Take 1 tablet (12.5 mg total) by mouth daily.   Lancets (ACCU-CHEK MULTICLIX) lancets USE TO CHECK BLOOD SUGAR TWICE DAILY   lisinopril  (ZESTRIL ) 40 MG tablet Take 1 tablet (40 mg total) by mouth daily.   metFORMIN  (GLUCOPHAGE -XR) 500 MG 24 hr tablet Take 1 tablet (500 mg total) by mouth 2 (two) times daily with a meal.   Multiple Vitamins-Minerals (MULTIVITAMIN WITH MINERALS) tablet Take 2 tablets by mouth daily.    polyethylene glycol (MIRALAX  / GLYCOLAX ) 17 g packet Take 17 g by mouth daily as needed for mild constipation.   Polyvinyl Alcohol -Povidone PF (REFRESH) 1.4-0.6 % SOLN Place 2 drops into both eyes daily as needed.   rosuvastatin  (CRESTOR ) 20 MG tablet Take 1 tablet (20 mg total) by mouth daily.   Semaglutide ,0.25 or 0.5MG /DOS, (OZEMPIC , 0.25 OR 0.5 MG/DOSE,) 2 MG/3ML SOPN Inject 0.5 mg  into the skin once a week.   [DISCONTINUED] amLODIPine -Valsartan -HCTZ 10-320-25 MG TABS Take 1 tablet by mouth daily.   No facility-administered encounter medications on file as of 08/21/2024.    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   Hypertension    Unsteady gait 07/2020   Past  Surgical History:  Procedure Laterality Date   BREAST BIOPSY Left unsure   CATARACT EXTRACTION Right    surgery on ears   10 or 68 yo    they did surgery to make me hear better but it just made it worse    TONSILLECTOMY  childhood   TOTAL KNEE ARTHROPLASTY Right 10/28/2019   Procedure: TOTAL KNEE ARTHROPLASTY;  Surgeon: Josefina Chew, MD;  Location: [facility];  Service: Orthopedics;  Laterality: Right;   Family History  Problem Relation Age of Onset   Diabetes Brother    Hypertension Brother    Diabetes Other    Hypertension Other    Social History   Socioeconomic History   Marital status: Single    Spouse name: Not on file   Number of children: Not on file   Years of education: Not on file   Highest education level: Not on file  Occupational History   Not on file  Tobacco Use   Smoking status: Former    Current packs/day: 0.00    Types: Cigarettes    Quit date: 2000    Years since quitting: 25.6   Smokeless tobacco: Never  Vaping Use   Vaping status: Never Used  Substance and Sexual Activity   Alcohol  use: No   Drug use: No   Sexual activity: Not Currently  Other Topics Concern   Not on file  Social History Narrative   Not on file   Social Drivers of Health   Financial Resource Strain: Low Risk  (08/21/2024)   Overall Financial Resource Strain (CARDIA)    Difficulty of Paying Living Expenses: Not hard at all  Food Insecurity: No Food Insecurity (08/21/2024)   Hunger Vital Sign    Worried About Running Out of Food in the Last Year: Never true    Ran Out of Food in the Last Year: Never true  Transportation Needs: No Transportation Needs (08/21/2024)   PRAPARE - Administrator, Civil Service (Medical): No    Lack of Transportation (Non-Medical): No  Physical Activity: Sufficiently Active (08/21/2024)   Exercise Vital Sign    Days of Exercise per Week: 7 days    Minutes of Exercise per Session: 30 min  Stress: No Stress Concern Present (08/21/2024)    [employer] of Occupational Health - Occupational Stress Questionnaire    Feeling of Stress: Not at all  Social Connections: Moderately Isolated (08/21/2024)   Social Connection and Isolation Panel    Frequency of Communication with Friends and Family: More than three times a week    Frequency of Social Gatherings with Friends and Family: More than three times a week    Attends Religious Services: More than 4 times per year    Active Member of [employer] or Organizations: No    Attends Banker Meetings: Never    Marital Status: Never married    Tobacco Counseling Counseling given: Not Answered   Clinical  Intake:  Pre-visit preparation completed: No  Pain : No/denies pain     BMI - recorded: 32.39 Nutritional Status: BMI > 30  Obese Nutritional Risks: None Diabetes: Yes CBG done?: No Did pt. bring in CBG monitor from home?: No  How often do you need to have someone help you when you read instructions, pamphlets, or other written materials from your doctor or pharmacy?: 1 - Never What is the last grade level you completed in school?: 9 th  Interpreter Needed?: No  Information entered by :: Suzen Shove   Activities of Daily Living    08/21/2024   11:13 AM  In your present state of health, do you have any difficulty performing the following activities:  Hearing? 1  Vision? 0  Difficulty concentrating or making decisions? 0  Walking or climbing stairs? 0  Dressing or bathing? 0  Doing errands, shopping? 0  Preparing Food and eating ? N  Using the Toilet? N  In the past six months, have you accidently leaked urine? N  Do you have problems with loss of bowel control? N  Managing your Medications? N  Managing your Finances? N  Housekeeping or managing your Housekeeping? N    Patient Care Team: Oley Bascom RAMAN, NP as PCP - General (Pulmonary Disease) Brinda Lorain SQUIBB, [facility] (Pharmacist)  Indicate any recent Medical Services you may have received from  other than [facility] providers in the past year (date may be approximate).     Assessment:   This is a routine wellness examination for Dotty.  Hearing/Vision screen No results found.   Goals Addressed             This Visit's Progress    CCM Expected Outcome:  Monitor, Self-Manage and Reduce Symptoms of:       All health problems and be proud of her self more     Patient Stated   On track    Remain active and independent        Depression Screen    08/21/2024   11:22 AM 08/14/2024   10:10 AM 01/24/2024   10:50 AM 10/24/2023   10:44 AM 08/16/2023    1:10 PM 04/16/2023   11:46 AM 07/13/2022   11:23 AM  PHQ 2/9 Scores  PHQ - 2 Score 0 0 0 0 0 0 0  PHQ- 9 Score 0 0  0       Fall Risk    08/21/2024   11:24 AM 08/14/2024   10:10 AM 01/24/2024   10:50 AM 10/24/2023   10:41 AM 08/16/2023    1:17 PM  Fall Risk   Falls in the past year? 0 0 0 0 0  Number falls in past yr: 0 0 0 0 0  Injury with Fall? 0 0 0 0 0  Risk for fall due to : No Fall Risks No Fall Risks No Fall Risks  No Fall Risks  Follow up Falls evaluation completed Falls evaluation completed Falls evaluation completed  Falls prevention discussed    MEDICARE RISK AT HOME: Medicare Risk at Home Any stairs in or around the home?: Yes If so, are there any without handrails?: Yes Home free of loose throw rugs in walkways, pet beds, electrical cords, etc?: Yes Adequate lighting in your home to reduce risk of falls?: Yes Life alert?: Yes Use of a cane, walker or w/c?: No Grab bars in the bathroom?: Yes Shower chair or bench in shower?: No Elevated toilet seat or a handicapped toilet?: Yes  TIMED UP AND GO:  Was the test performed?  No    Cognitive Function:        08/21/2024   11:25 AM 08/21/2024   11:23 AM 08/16/2023    1:07 PM  6CIT Screen  What Year? 0 points 0 points 0 points  What month? 0 points 0 points 0 points  What time? 0 points 0 points 0 points  Count back from 20 0 points 0 points 0 points   Months in reverse 2 points  0 points  Repeat phrase 0 points  0 points  Total Score 2 points  0 points    Immunizations Immunization History  Administered Date(s) Administered   Influenza,inj,Quad PF,6+ Mos 12/03/2014, 09/09/2015, 10/30/2016, 08/31/2017, 09/09/2018, 10/07/2021   Pneumococcal Conjugate-13 01/24/2016   Pneumococcal Polysaccharide-23 08/14/2014   Tdap 08/14/2014    TDAP status: Due, Education has been provided regarding the importance of this vaccine. Advised may receive this vaccine at local pharmacy or Health Dept. Aware to provide a copy of the vaccination record if obtained from local pharmacy or Health Dept. Verbalized acceptance and understanding.  Flu Vaccine status: Up to date  Pneumococcal vaccine status: Due, Education has been provided regarding the importance of this vaccine. Advised may receive this vaccine at local pharmacy or Health Dept. Aware to provide a copy of the vaccination record if obtained from local pharmacy or Health Dept. Verbalized acceptance and understanding.  Covid-19 vaccine status: Declined, Education has been provided regarding the importance of this vaccine but patient still declined. Advised may receive this vaccine at local pharmacy or Health Dept.or vaccine clinic. Aware to provide a copy of the vaccination record if obtained from local pharmacy or Health Dept. Verbalized acceptance and understanding.  Qualifies for Shingles Vaccine? Yes   Zostavax completed No   Shingrix Completed?: No.    Education has been provided regarding the importance of this vaccine. Patient has been advised to call insurance company to determine out of pocket expense if they have not yet received this vaccine. Advised may also receive vaccine at local pharmacy or Health Dept. Verbalized acceptance and understanding.  Screening Tests Health Maintenance  Topic Date Due   Zoster Vaccines- Shingrix (1 of 2) Never done   Pneumococcal Vaccine: 50+ Years (3 of 3  - PCV20 or PCV21) 01/23/2021   OPHTHALMOLOGY EXAM  04/06/2022   COVID-19 Vaccine (1 - 2024-25 season) Never done   DTaP/Tdap/Td (2 - Td or Tdap) 08/14/2024   INFLUENZA VACCINE  07/25/2024   MAMMOGRAM  08/22/2024   Diabetic kidney evaluation - Urine ACR  01/23/2025   HEMOGLOBIN A1C  02/14/2025   Colonoscopy  03/01/2025   Diabetic kidney evaluation - eGFR measurement  07/30/2025   FOOT EXAM  08/14/2025   Medicare Annual Wellness (AWV)  08/21/2025   DEXA SCAN  Completed   Hepatitis C Screening  Completed   HPV VACCINES  Aged Out   Meningococcal B Vaccine  Aged Out    Health Maintenance  Health Maintenance Due  Topic Date Due   Zoster Vaccines- Shingrix (1 of 2) Never done   Pneumococcal Vaccine: 50+ Years (3 of 3 - PCV20 or PCV21) 01/23/2021   OPHTHALMOLOGY EXAM  04/06/2022   COVID-19 Vaccine (1 - 2024-25 season) Never done   DTaP/Tdap/Td (2 - Td or Tdap) 08/14/2024   INFLUENZA VACCINE  07/25/2024    Colorectal cancer screening: Type of screening: Colonoscopy. Completed 03/02/15. Repeat every 10 years  Mammogram status: Completed 08/23/23. Repeat every year  Lung Cancer Screening: (Low Dose CT Chest recommended if Age 92-80 years, 20 pack-year currently smoking OR have quit w/in 15years.) does not qualify.   Lung Cancer Screening Referral: N/A  Additional Screening:  Hepatitis C Screening: does not qualify; Completed 01/24/16  Vision Screening: Recommended annual ophthalmology exams for early detection of glaucoma and other disorders of the eye. Is the patient up to date with their annual eye exam?  Yes  Who is the provider or what is the name of the office in which the patient attends annual eye exams? [facility] If pt is not established with a provider, would they like to be referred to a provider to establish care? No .   Dental Screening: Recommended annual dental exams for proper oral hygiene  Diabetic Foot Exam: Diabetic Foot Exam: Completed 08/14/24  Community  Resource Referral / Chronic Care Management: CRR required this visit?  No   CCM required this visit?  No     Plan:     I have personally reviewed and noted the following in the patient's chart:   Medical and social history Use of alcohol , tobacco or illicit drugs  Current medications and supplements including opioid prescriptions. Patient is not currently taking opioid prescriptions. Functional ability and status Nutritional status Physical activity Advanced directives List of other physicians Hospitalizations, surgeries, and ER visits in previous 12 months Vitals Screenings to include cognitive, depression, and falls Referrals and appointments  In addition, I have reviewed and discussed with patient certain preventive protocols, quality metrics, and best practice recommendations. A written personalized care plan for preventive services as well as general preventive health recommendations were provided to patient.     Suzen Shove, RMA   08/21/2024   After Visit Summary: (Declined) Due to this being a telephonic visit, with patients personalized plan was offered to patient but patient Declined AVS at this time   Nurse Notes: Thank you for your time.   Suzen Shove   CMA II

## 2024-08-27 ENCOUNTER — Ambulatory Visit

## 2024-09-02 ENCOUNTER — Ambulatory Visit
Admission: RE | Admit: 2024-09-02 | Discharge: 2024-09-02 | Disposition: A | Discharge status: Home / Self Care | Source: Ambulatory Visit | Attending: Nurse Practitioner

## 2024-09-02 DIAGNOSIS — Z1231 Encounter for screening mammogram for malignant neoplasm of breast: Secondary | ICD-10-CM

## 2024-09-10 ENCOUNTER — Telehealth: Payer: Self-pay | Admitting: Nurse Practitioner

## 2024-09-10 ENCOUNTER — Other Ambulatory Visit (HOSPITAL_COMMUNITY): Payer: Self-pay

## 2024-09-10 NOTE — Telephone Encounter (Signed)
 Copied from CRM [id number]. Topic: Referral - Status >> Sep 10, 2024  3:21 PM Larissa S wrote: Reason for CRM: Patient calling to check the status of referral for a dentist and audiologist. Advised that referrals are pending.

## 2024-09-17 ENCOUNTER — Ambulatory Visit (INDEPENDENT_AMBULATORY_CARE_PROVIDER_SITE_OTHER): Payer: Self-pay

## 2024-09-17 ENCOUNTER — Other Ambulatory Visit (HOSPITAL_COMMUNITY): Payer: Self-pay

## 2024-09-17 ENCOUNTER — Other Ambulatory Visit: Payer: Self-pay

## 2024-09-17 DIAGNOSIS — E1100 Type 2 diabetes mellitus with hyperosmolarity without nonketotic hyperglycemic-hyperosmolar coma (NKHHC): Secondary | ICD-10-CM

## 2024-09-17 MED ORDER — METFORMIN HCL ER 500 MG PO TB24
500.0000 mg | ORAL_TABLET | Freq: Two times a day (BID) | ORAL | 3 refills | Status: DC
  Filled 2024-09-17: qty 180, 90d supply, fill #0

## 2024-09-17 MED ORDER — OZEMPIC (1 MG/DOSE) 4 MG/3ML ~~LOC~~ SOPN
1.0000 mg | PEN_INJECTOR | SUBCUTANEOUS | 5 refills | Status: DC
Start: 2024-09-17 — End: 2024-11-14
  Filled 2024-09-17 – 2024-10-29 (×2): qty 3, 28d supply, fill #0

## 2024-09-17 NOTE — Patient Instructions (Signed)
 It was nice to see you today!  Your goal blood sugar is 80-130 before eating and less than 180 after eating. Your goal blood pressure is less than 130/80 mmHg  Please check your blood sugar and blood pressure at home.  Medication Changes: Continue all medication the same.   I will call you in November to discuss increasing the dose of Ozempic , which may help lower your blood sugars a little bit more.   Monitor blood sugars at home and keep a log (glucometer or piece of paper) to bring with you to your next visit.  Keep up the good work with diet and exercise. Aim for a diet full of vegetables, fruit and lean meats (chicken, [place], fish). Try to limit salt intake by eating fresh or frozen vegetables (instead of canned), rinse canned vegetables prior to cooking and do not add any additional salt to meals.

## 2024-09-17 NOTE — Progress Notes (Signed)
 09/17/2024 Name: Candace Diaz MRN: [id number] DOB: 1956-08-27  Chief Complaint  Patient presents with   Hypertension   Diabetes    Candace Diaz is a 68 y.o. year old female who presented for a face to face visit. She is hard of hearing. She presents alone today.   They were referred to the pharmacist by their PCP for assistance in managing diabetes. PMH includes HTN, T2DM, OA of knee s/p TKA, hx of palpitations, HLD, CVA with residual deficit, BMI > 30.    Subjective: Patient was seen by PCP, Candace Borer, NP on 05/05/24. BP was elevated to 145/75 mmHg, HR 72. A1C improved to 6.9% from 7.1%.. At last pharmacist telephone appt on 05/05/24 - pt reported home BP of 139/84. It was noted that she had recently decreased hydrochlorothiazide  due to hypokalemia (K 3.4 mEq/L), but had not had a repeat BMET yet. Patient was seen by pharmacy in-preson on 06/10/24. Her BP was 143/82 mmHg. Her K had normalized on he repeat BMP. She was instructed to increase lisinopril  to 40 mg daily. At follow-up on 07/30/24 patient brought a list of her medications at home BP which were elevated, but home cuff has not been validated. Home BP was close to goal in the setting of patient only taking lisinopril  that morning. Identified adherence concerns with Ozempic  due to fill hx and requested fill from pharmacy. BMP demonstrated stable Scr and K after increasing lisinopril .   Today, patient reports doing well. She brought her medications with her, but did not bring her BP cuff. Reports she has forgotten to check her BP at home recently. She took lisinopril  and hydrochlorothiazide  this morning and reports she took amlodipine  yesterday. She reports she has been taking Ozempic  0.5 mg weekly as prescribed. Denies GI AE or severe appetite suppression.   Care Team: Primary Care Provider: Borer Candace RAMAN, NP ; Next Scheduled Visit: 11/14/24  Medication Access/Adherence  Current Pharmacy:  Candace Diaz - [facility]  Pharmacy [address] [place] [address] Phone: [phone] Fax: [phone]   Patient reports affordability concerns with their medications: No  Patient reports access/transportation concerns to their pharmacy: No  Patient reports adherence concerns with their medications:  No  - has had difficulty in the past refilling medications, but fill history currently appears appropriate. She is using [employer] for mail order.   Diabetes:  Current medications: Ozempic  0.5 mg weekly (Wednesdays, Denies missed doses), metformin  XR 500 mg BID (due for refill)  Using glucometer; testing every morning  AM Fasting BG today ~114 mg/dL. She does not have her glucometer with her.   Patient denies hypoglycemic s/sx including dizziness, shakiness, sweating. Patient denies hyperglycemic symptoms including polyuria, polydipsia, polyphagia, nocturia, neuropathy, blurred vision.   Hypertension:  Current medications: lisinopril  40 mg daily, amlodipine  10 mg daily, hydrochlorothiazide  12.5 mg daily Medications previously tried: hydrochlorothiazide  25 mg (hypokalemia)  Reports this morning she only took lisinopril  40 mg and hydrochlorothiazide  12.5 mg (not amlodipine ), because she did not eat yet (because she was coming to her appt). She usually takes all her medications in the morning with breakfast.   Patient has an automated, upper arm home BP cuff. Reports that it has not been validated in clinic. She confirms proper BP check technique - rests in a seated position for several minutes prior to checking. No caffeine within 30 minutes of checking.   Patient-reported home BP : none to report today  Patient denies hypotensive s/sx including dizziness, lightheadedness.  Patient denies hypertensive symptoms including pain, shortness of breath. Patient endorses occasional headaches.   Hyperlipidemia/ASCVD Risk Reduction  Current lipid lowering medications: rosuvastatin  20 mg  daily Medications tried in the past: simvastatin  (switched d/t drug interaction with amlodipine )  ASCVD History: CVA  Objective:  BP Readings from Last 3 Encounters:  09/17/24 119/75  08/14/24 137/74  07/30/24 (!) 134/90    Lab Results  Component Value Date   HGBA1C 7.1 (A) 08/14/2024   HGBA1C 6.9 (A) 05/05/2024   HGBA1C 7.1 (A) 01/24/2024   UACR 01/24/24: 23 mg/g  Lab Results  Component Value Date   CREATININE 0.86 07/30/2024   BUN 14 07/30/2024   NA 143 07/30/2024   K 3.7 07/30/2024   CL 100 07/30/2024   CO2 24 07/30/2024    Lab Results  Component Value Date   CHOL 113 01/24/2024   HDL 55 01/24/2024   LDLCALC 41 01/24/2024   TRIG 86 01/24/2024   CHOLHDL 2.1 01/24/2024    Medications Reviewed Today     Reviewed by Candace Diaz, RPH (Pharmacist) on 09/17/24 at 1158  Med List Status: <None>   Medication Order Taking? Sig Documenting Provider Last Dose Status Informant  acetaminophen  (TYLENOL ) 500 MG tablet [id number]  Take 1,000 mg by mouth every 6 (six) hours as needed for moderate pain or headache. [provider]  Active Family Member           Med Note DELSA LORAIN Diaz   Mon Aug 20, 2023  2:18 PM) PRN headaches  albuterol  (PROAIR  HFA) 108 (90 Base) MCG/ACT inhaler [id number]  INHALE 2 PUFFS BY MOUTH FOUR TIMES A DAY AS NEEDED FOR SHORTNESS OF BREATH. Oley Candace RAMAN, NP  Active   amLODipine  (NORVASC ) 10 MG tablet [id number] Yes Take 1 tablet (10 mg total) by mouth daily. Oley Candace RAMAN, NP  Active     Discontinued 07/30/24 1658 (Prescription never filled)   baclofen  (LIORESAL ) 10 MG tablet [id number] Yes Take 1 tablet (10 mg total) by mouth 3 (three) times daily. Oley Candace RAMAN, NP  Active   cholecalciferol  (VITAMIN D3) 25 MCG (1000 UT) tablet [id number]  Take 2,000 Units by mouth daily. [provider]  Active Family Member  Cyanocobalamin  (VITAMIN B 12 PO) [id number]  Take by mouth. [provider]  Active   glucose blood (ACCU-CHEK  GUIDE) test strip [id number]  Use as instructed Stroud, Natalie M, FNP  Active   hydrochlorothiazide  (HYDRODIURIL ) 12.5 MG tablet [id number] Yes Take 1 tablet (12.5 mg total) by mouth daily. Oley Candace RAMAN, NP  Active   Lancets (ACCU-CHEK MULTICLIX) lancets [id number]  USE TO CHECK BLOOD SUGAR TWICE DAILY Stroud, Natalie M, FNP  Active   lisinopril  (ZESTRIL ) 40 MG tablet [id number] Yes Take 1 tablet (40 mg total) by mouth daily. Nichols, Tonya S, NP  Active   metFORMIN  (GLUCOPHAGE -XR) 500 MG 24 hr tablet [id number] Yes Take 1 tablet (500 mg total) by mouth 2 (two) times daily with a meal. Nichols, Tonya S, NP  Active   Multiple Vitamins-Minerals (MULTIVITAMIN WITH MINERALS) tablet [id number]  Take 2 tablets by mouth daily.  [provider]  Active Family Member  polyethylene glycol (MIRALAX  / GLYCOLAX ) 17 g packet [id number]  Take 17 g by mouth daily as needed for mild constipation. Nichols, Tonya S, NP  Active   Polyvinyl Alcohol -Povidone PF (REFRESH) 1.4-0.6 % SOLN [id number]  Place 2 drops into both eyes daily as needed. Shannan Christain FERNS, NP  Active  Med Note SAMULE IHA   Mon Apr 16, 2023 11:45 AM) prn  rosuvastatin  (CRESTOR ) 20 MG tablet [id number] Yes Take 1 tablet (20 mg total) by mouth daily. Oley Candace RAMAN, NP  Active   Semaglutide ,0.25 or 0.5MG /DOS, (OZEMPIC , 0.25 OR 0.5 MG/DOSE,) 2 MG/3ML SOPN [id number] Yes Inject 0.5 mg into the skin once a week. Oley Candace RAMAN, NP  Active               Assessment/Plan:   Diabetes: - Currently uncontrolled with last A1C 7.1% above goal < 7%, but only slightly worsened from 6.9%. Patient is tolerating Ozempic  well. She has ~2 more boxes remaining of Ozempic  0.5 mg weekly. Will discuss increasing to Ozempic  1 mg at next fill since she is tolerating well.  - Reviewed long term cardiovascular and renal outcomes of uncontrolled blood sugar - Reviewed goal A1c, goal fasting, and goal 2 hour post prandial glucose - Recommend to  continue Ozempic  0.5 mg weekly and plan to increase to Ozempic  1 mg weekly at next fill - Continue metformin  XR 500 mg BID.  - Requested refill of metformin  from [facility] pharmacy.  - Patient denies personal or family history of multiple endocrine neoplasia type 2, medullary thyroid  cancer; personal history of pancreatitis or gallbladder disease. - Recommend to check fasting blood glucose daily and bring glucometer to upcoming appointments   Hypertension: - Currently controlled with clinic BP below goal < 130/80 mmHg. Previous clinic BP was close to goal. She is tolerating her current regimen well and last BMP was stable. Could consider switching to triple combination tablet in the future, but hesitant to make changes now that patient is demonstrating good adherence and BP control. Would have to choose dose carefully, as patient had hypokalemia on hydrochlorothiazide  25 mg daily.  - Reviewed long term cardiovascular and renal outcomes of uncontrolled blood pressure - Reviewed appropriate blood pressure monitoring technique and reviewed goal blood pressure.  - Recommended to check home blood pressure and heart rate daily and bring a log of readings to upcoming appointments.Encouraged patient to bring her BP device to her upcoming appointments. - Recommend to continue amlodipine  10 mg daily, hydrochlorothiazide  12.5 mg daily, and lisinopril  40 mg daily   Hyperlipidemia/ASCVD Risk Reduction: - Currently controlled with last LDL 41 mg/dL, at goal <44 mg/dL given history of CVA with risk factors DM and HTN - Recommend to continue rosuvastatin  20 mg daily    Follow Up Plan: PCP 11/14/24, PharmD telephone 10/29/24  Lorain Baseman, PharmD [facility] [phone]

## 2024-09-23 NOTE — Progress Notes (Signed)
 Candace Diaz                                          MRN: [id number]   09/23/2024   The VBCI Quality Team Specialist reviewed this patient medical record for the purposes of chart review for care gap closure. The following were reviewed: chart review for care gap closure-diabetic eye exam.    VBCI Quality Team

## 2024-09-30 ENCOUNTER — Other Ambulatory Visit (HOSPITAL_COMMUNITY): Payer: Self-pay

## 2024-10-27 ENCOUNTER — Other Ambulatory Visit (HOSPITAL_COMMUNITY): Payer: Self-pay

## 2024-10-27 ENCOUNTER — Other Ambulatory Visit: Payer: Self-pay

## 2024-10-29 ENCOUNTER — Other Ambulatory Visit: Payer: Self-pay

## 2024-10-29 ENCOUNTER — Other Ambulatory Visit (HOSPITAL_COMMUNITY): Payer: Self-pay

## 2024-10-29 ENCOUNTER — Telehealth: Payer: Self-pay

## 2024-10-29 NOTE — Telephone Encounter (Signed)
 Attempted to contact patient for telephone appt today. Left VM requesting call back. Noted that patient is due for refill of Ozempic . Had previously discussed increasing to Ozempic  1 mg weekly - higher dose on profile at the pharmacy. Requested for 1 mo supply of Ozempic  to be mailed out from [facility] pharmacy. Will have population health pharmacy technician follow-up with patient one week to ensure she received and is tolerating new dose.   Lorain Baseman, PharmD [facility] [phone]

## 2024-10-29 NOTE — Progress Notes (Deleted)
 10/29/2024 Name: Candace Diaz MRN: [id number] DOB: November 28, 1956  No chief complaint on file.   Candace Diaz is a 68 y.o. year old female who presented for a face to face visit. She is hard of hearing. She presents alone today.   They were referred to the pharmacist by their PCP for assistance in managing diabetes. PMH includes HTN, T2DM, OA of knee s/p TKA, hx of palpitations, HLD, CVA with residual deficit, BMI > 30.    Subjective: Patient was seen by PCP, Bascom Borer, NP on 05/05/24. BP was elevated to 145/75 mmHg, HR 72. A1C improved to 6.9% from 7.1%.. At last pharmacist telephone appt on 05/05/24 - pt reported home BP of 139/84. It was noted that she had recently decreased hydrochlorothiazide  due to hypokalemia (K 3.4 mEq/L), but had not had a repeat BMET yet. Patient was seen by pharmacy in-preson on 06/10/24. Her BP was 143/82 mmHg. Her K had normalized on he repeat BMP. She was instructed to increase lisinopril  to 40 mg daily. At follow-up on 07/30/24 patient brought a list of her medications at home BP which were elevated, but home cuff has not been validated. Home BP was close to goal in the setting of patient only taking lisinopril  that morning. Identified adherence concerns with Ozempic  due to fill hx and requested fill from pharmacy. BMP demonstrated stable Scr and K after increasing lisinopril .   Today, patient reports doing well. She brought her medications with her, but did not bring her BP cuff. Reports she has forgotten to check her BP at home recently. She took lisinopril  and hydrochlorothiazide  this morning and reports she took amlodipine  yesterday. She reports she has been taking Ozempic  0.5 mg weekly as prescribed. Denies GI AE or severe appetite suppression.   Care Team: Primary Care Provider: Borer Bascom RAMAN, NP ; Next Scheduled Visit: 11/14/24  Medication Access/Adherence  Current Pharmacy:  DARRYLE LAW - [facility] [address] [place]  [place] [address] Phone: [phone] Fax: [phone]   Patient reports affordability concerns with their medications: No  Patient reports access/transportation concerns to their pharmacy: No  Patient reports adherence concerns with their medications:  No  - has had difficulty in the past refilling medications, but fill history currently appears appropriate. She is using [employer] for mail order.   Diabetes:  Current medications: Ozempic  0.5 mg weekly (Wednesdays, Denies missed doses), metformin  XR 500 mg BID (due for refill)  Using glucometer; testing every morning  AM Fasting BG today ~114 mg/dL. She does not have her glucometer with her.   Patient denies hypoglycemic s/sx including dizziness, shakiness, sweating. Patient denies hyperglycemic symptoms including polyuria, polydipsia, polyphagia, nocturia, neuropathy, blurred vision.   Hypertension:  Current medications: lisinopril  40 mg daily, amlodipine  10 mg daily, hydrochlorothiazide  12.5 mg daily Medications previously tried: hydrochlorothiazide  25 mg (hypokalemia)  Reports this morning she only took lisinopril  40 mg and hydrochlorothiazide  12.5 mg (not amlodipine ), because she did not eat yet (because she was coming to her appt). She usually takes all her medications in the morning with breakfast.   Patient has an automated, upper arm home BP cuff. Reports that it has not been validated in clinic. She confirms proper BP check technique - rests in a seated position for several minutes prior to checking. No caffeine within 30 minutes of checking.   Patient-reported home BP : none to report today  Patient denies hypotensive s/sx including dizziness, lightheadedness.  Patient denies hypertensive symptoms including pain, shortness  of breath. Patient endorses occasional headaches.   Hyperlipidemia/ASCVD Risk Reduction  Current lipid lowering medications: rosuvastatin  20 mg daily Medications tried in the past: simvastatin   (switched d/t drug interaction with amlodipine )  ASCVD History: CVA  Objective:  BP Readings from Last 3 Encounters:  09/17/24 119/75  08/14/24 137/74  07/30/24 (!) 134/90    Lab Results  Component Value Date   HGBA1C 7.1 (A) 08/14/2024   HGBA1C 6.9 (A) 05/05/2024   HGBA1C 7.1 (A) 01/24/2024   UACR 01/24/24: 23 mg/g  Lab Results  Component Value Date   CREATININE 0.86 07/30/2024   BUN 14 07/30/2024   NA 143 07/30/2024   K 3.7 07/30/2024   CL 100 07/30/2024   CO2 24 07/30/2024    Lab Results  Component Value Date   CHOL 113 01/24/2024   HDL 55 01/24/2024   LDLCALC 41 01/24/2024   TRIG 86 01/24/2024   CHOLHDL 2.1 01/24/2024    Medications Reviewed Today   Medications were not reviewed in this encounter       Assessment/Plan:   Diabetes: - Currently uncontrolled with last A1C 7.1% above goal < 7%, but only slightly worsened from 6.9%. Patient is tolerating Ozempic  well. She has ~2 more boxes remaining of Ozempic  0.5 mg weekly. Will discuss increasing to Ozempic  1 mg at next fill since she is tolerating well.  - Reviewed long term cardiovascular and renal outcomes of uncontrolled blood sugar - Reviewed goal A1c, goal fasting, and goal 2 hour post prandial glucose - Recommend to continue Ozempic  0.5 mg weekly and plan to increase to Ozempic  1 mg weekly at next fill - Continue metformin  XR 500 mg BID.  - Requested refill of metformin  from [facility] pharmacy.  - Patient denies personal or family history of multiple endocrine neoplasia type 2, medullary thyroid  cancer; personal history of pancreatitis or gallbladder disease. - Recommend to check fasting blood glucose daily and bring glucometer to upcoming appointments   Hypertension: - Currently controlled with clinic BP below goal < 130/80 mmHg. Previous clinic BP was close to goal. She is tolerating her current regimen well and last BMP was stable. Could consider switching to triple combination tablet in the future, but  hesitant to make changes now that patient is demonstrating good adherence and BP control. Would have to choose dose carefully, as patient had hypokalemia on hydrochlorothiazide  25 mg daily.  - Reviewed long term cardiovascular and renal outcomes of uncontrolled blood pressure - Reviewed appropriate blood pressure monitoring technique and reviewed goal blood pressure.  - Recommended to check home blood pressure and heart rate daily and bring a log of readings to upcoming appointments.Encouraged patient to bring her BP device to her upcoming appointments. - Recommend to continue amlodipine  10 mg daily, hydrochlorothiazide  12.5 mg daily, and lisinopril  40 mg daily   Hyperlipidemia/ASCVD Risk Reduction: - Currently controlled with last LDL 41 mg/dL, at goal <44 mg/dL given history of CVA with risk factors DM and HTN - Recommend to continue rosuvastatin  20 mg daily    Follow Up Plan: PCP 11/14/24, PharmD telephone 10/29/24  Lorain Baseman, PharmD [facility] [phone]

## 2024-11-03 ENCOUNTER — Telehealth: Payer: Self-pay

## 2024-11-03 NOTE — Telephone Encounter (Signed)
 Copied from CRM [id number]. Topic: General - Other >> Oct 30, 2024 11:55 AM Nathanel BROCKS wrote: Reason for CRM: pt called to see if she has a dentist appt. Asking if her provider had found her one yet. Please advise.  Dentist list was mailed  to pt. Call made and lvm advising . KH

## 2024-11-04 ENCOUNTER — Telehealth (HOSPITAL_COMMUNITY): Payer: Self-pay | Admitting: Pharmacy Technician

## 2024-11-04 ENCOUNTER — Telehealth: Payer: Self-pay | Admitting: Pharmacy Technician

## 2024-11-04 NOTE — Progress Notes (Signed)
   11/04/2024 Name: Candace Diaz MRN: [id number] DOB: [birth year]  Patient engaged with clinical pharmacist for management of diabetes, hypertension, and hyperlipidemia/cardiovascular risk reduction on 09/17/2024. Outreach by [employer] technician was requested.   Outreached patient to discuss diabetes medication management. Left voicemail for patient to return my call at their convenience.    Arletta Lumadue, CPhT [facility] Office: [phone] Email: [email or web address]

## 2024-11-04 NOTE — Telephone Encounter (Signed)
 Erroneous Encounter.  Candace Diaz, CPhT [facility] Office: [phone] Email: [name]

## 2024-11-06 ENCOUNTER — Telehealth: Payer: Self-pay | Admitting: Pharmacy Technician

## 2024-11-06 NOTE — Progress Notes (Addendum)
 11/06/2024  Patient ID: Candace Diaz, female   DOB: November 19, 1956, 68 y.o.   MRN: [id number]  Patient engaged with clinical pharmacist for management of diabetes, hypertension, and hyperlipidemia/cardiovascular risk reduction on 09/17/2024. Outreach by [employer] technician was requested.   Outreached patient to discuss diabetes medication management. Left voicemail for patient to return my call at their convenience.   ADDENDUM 11/06/2024 11:30am-Patient returned the call  Patient is appearing for a follow-up visit with the population health pharmacy technician. Last engaged with the clinical pharmacist to discuss diabetes and hypertension on 09/17/2024. Contacted patient today to discuss diabetes and hypertension.   Plan from last clinical pharmacist appointment:  Diabetes: - Currently uncontrolled with last A1C 7.1% above goal < 7%, but only slightly worsened from 6.9%. Patient is tolerating Ozempic  well. She has ~2 more boxes remaining of Ozempic  0.5 mg weekly. Will discuss increasing to Ozempic  1 mg at next fill since she is tolerating well.  - Reviewed long term cardiovascular and renal outcomes of uncontrolled blood sugar - Reviewed goal A1c, goal fasting, and goal 2 hour post prandial glucose - Recommend to continue Ozempic  0.5 mg weekly and plan to increase to Ozempic  1 mg weekly at next fill - Continue metformin  XR 500 mg BID.  - Requested refill of metformin  from [facility] pharmacy.  - Patient denies personal or family history of multiple endocrine neoplasia type 2, medullary thyroid  cancer; personal history of pancreatitis or gallbladder disease. - Recommend to check fasting blood glucose daily and bring glucometer to upcoming appointments Hypertension: - Currently controlled with clinic BP below goal < 130/80 mmHg. Previous clinic BP was close to goal. She is tolerating her current regimen well and last BMP was stable. Could consider switching to triple combination tablet  in the future, but hesitant to make changes now that patient is demonstrating good adherence and BP control. Would have to choose dose carefully, as patient had hypokalemia on hydrochlorothiazide  25 mg daily.  - Reviewed long term cardiovascular and renal outcomes of uncontrolled blood pressure - Reviewed appropriate blood pressure monitoring technique and reviewed goal blood pressure.  - Recommended to check home blood pressure and heart rate daily and bring a log of readings to upcoming appointments.Encouraged patient to bring her BP device to her upcoming appointments. - Recommend to continue amlodipine  10 mg daily, hydrochlorothiazide  12.5 mg daily, and lisinopril  40 mg daily Hyperlipidemia/ASCVD Risk Reduction: - Currently controlled with last LDL 41 mg/dL, at goal <44 mg/dL given history of CVA with risk factors DM and HTN - Recommend to continue rosuvastatin  20 mg daily Follow Up Plan: PCP 11/14/24, PharmD telephone 10/29/24(copy/paste from last note)   Medication Adherence Barriers Identified:  Patient made recommended medication changes per plan: Yes Patient informs she is currently using Ozempic  0.5mg  weekly BUT she did receive the Ozempic  1mg  weekly. She was informed to use up Ozempic  0.5mg  weekly and once that was all gone to start the Ozempic  1mg  weekly. She verbalized understanding. She also informs she takes Metformin  twice a day and 3 blood pressure medications that she takes once a day. Access issues with any new medication or testing device: No Per Dr Annemarie fill history data, Ozempic  1mg  was shipped 11/5 and has been received, Metformin  last filled for 90 day supply o 9/24, Lisinopril  for 90 on 9/24, Amlodipine  on 9/17 for 90 and hydrochlorothiazide  12.5mg  for 90 on 10/27. Patient is checking blood sugars as prescribed: Yes She informs blood sugars are doing good. At the time of  this call patient's reports a blood sugar of 115 this morning before breakfast. Patient is checking blood  pressure readings as prescribed: Yes Patient informs she checks her blood pressure every once in a while and they are doing good   Medication Adherence Barriers Addressed/Actions Taken:  Reviewed medication changes per plan from last clinical pharmacist note Medication Access for Ozempic  1mg . Patient received it from [facility]. Will discuss medication access concerns with pharmacist Educated patient to contact pharmacy regarding new prescriptions  Reviewed instructions for monitoring blood sugars and blood pressures at home and reminded patient to keep a written log to review with pharmacist Reminded patient of date/time of upcoming clinical pharmacist follow up and any upcoming PCP/specialists visits. Patient denies transportation barriers to the appointment. Yes  Next clinical pharmacist appointment is scheduled for: 12/30/2024   Danine Hor, CPhT [facility] Office: [phone] Email: [email or web address]

## 2024-11-14 ENCOUNTER — Encounter: Payer: Self-pay | Admitting: Nurse Practitioner

## 2024-11-14 ENCOUNTER — Other Ambulatory Visit: Payer: Self-pay

## 2024-11-14 ENCOUNTER — Ambulatory Visit (INDEPENDENT_AMBULATORY_CARE_PROVIDER_SITE_OTHER): Payer: Self-pay | Admitting: Nurse Practitioner

## 2024-11-14 ENCOUNTER — Other Ambulatory Visit (HOSPITAL_COMMUNITY): Payer: Self-pay

## 2024-11-14 VITALS — BP 135/82 | HR 76 | Wt 212.0 lb

## 2024-11-14 DIAGNOSIS — Z23 Encounter for immunization: Secondary | ICD-10-CM

## 2024-11-14 DIAGNOSIS — I10 Essential (primary) hypertension: Secondary | ICD-10-CM

## 2024-11-14 DIAGNOSIS — E1100 Type 2 diabetes mellitus with hyperosmolarity without nonketotic hyperglycemic-hyperosmolar coma (NKHHC): Secondary | ICD-10-CM | POA: Diagnosis not present

## 2024-11-14 LAB — POCT GLYCOSYLATED HEMOGLOBIN (HGB A1C): Hemoglobin A1C: 7.3 % — AB (ref 4.0–5.6)

## 2024-11-14 MED ORDER — METFORMIN HCL ER 500 MG PO TB24
500.0000 mg | ORAL_TABLET | Freq: Two times a day (BID) | ORAL | 3 refills | Status: AC
  Filled 2024-11-14 – 2024-12-09 (×2): qty 180, 90d supply, fill #0

## 2024-11-14 MED ORDER — ROSUVASTATIN CALCIUM 20 MG PO TABS
20.0000 mg | ORAL_TABLET | Freq: Every day | ORAL | 3 refills | Status: AC
  Filled 2024-11-14: qty 90, 90d supply, fill #0

## 2024-11-14 MED ORDER — OZEMPIC (1 MG/DOSE) 4 MG/3ML ~~LOC~~ SOPN
1.0000 mg | PEN_INJECTOR | SUBCUTANEOUS | 5 refills | Status: AC
  Filled 2024-11-14: qty 3, 28d supply, fill #0

## 2024-11-14 MED ORDER — LISINOPRIL 40 MG PO TABS
40.0000 mg | ORAL_TABLET | Freq: Every day | ORAL | 3 refills | Status: AC
  Filled 2024-11-14 – 2024-12-09 (×2): qty 90, 90d supply, fill #0

## 2024-11-14 MED ORDER — HYDROCHLOROTHIAZIDE 12.5 MG PO TABS
12.5000 mg | ORAL_TABLET | Freq: Every day | ORAL | 3 refills | Status: AC
  Filled 2024-11-14 – 2024-12-07 (×3): qty 90, 90d supply, fill #0

## 2024-11-14 MED ORDER — BACLOFEN 10 MG PO TABS
10.0000 mg | ORAL_TABLET | Freq: Three times a day (TID) | ORAL | 2 refills | Status: AC
  Filled 2024-11-14: qty 30, 10d supply, fill #0

## 2024-11-14 MED ORDER — AMLODIPINE BESYLATE 10 MG PO TABS
10.0000 mg | ORAL_TABLET | Freq: Every day | ORAL | 3 refills | Status: AC
  Filled 2024-11-14 – 2024-12-06 (×2): qty 90, 90d supply, fill #0

## 2024-11-14 NOTE — Progress Notes (Signed)
 Subjective   Patient ID: Candace Diaz, female    DOB: 1956/05/02, 68 y.o.   MRN: [id number]  Chief Complaint  Patient presents with   3 month follow up   Diabetes   Hearing Problem    Referring provider: Oley Bascom RAMAN, NP  Candace Diaz is a 68 y.o. 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 Symptoms: none. Patient denies hypoglycemia , increase appetite, and nausea.  Evaluation to date has been included: hemoglobin A1C. Treatment to date: no recent interventions. A1C in office today is 7.3. Is following with pharmacy for diabetic medication management.    Hypertension:   Current medications: lisinopril  20 mg daily, hydrochlorothiazide  25 mg daily, amlodipine  10 mg daily   Needs referrals to audiology and dentist   Denies f/c/s, n/v/d, hemoptysis, PND, leg swelling Denies chest pain or edema     Allergies  Allergen Reactions   Aspirin Other (See Comments)    Internal bleeding   Penicillins Hives    Did it involve swelling of the face/tongue/throat, SOB, or low BP? Unknown Did it involve sudden or severe rash/hives, skin peeling, or any reaction on the inside of your mouth or nose? Unknown Did you need to seek medical attention at a hospital or doctor's office? Unknown When did it last happen?      years If all above answers are "NO", may proceed with cephalosporin use.    Immunization History  Administered Date(s) Administered   Influenza,inj,Quad PF,6+ Mos 12/03/2014, 09/09/2015, 10/30/2016, 08/31/2017, 09/09/2018, 10/07/2021   Pneumococcal Conjugate-13 01/24/2016   Pneumococcal Polysaccharide-23 08/14/2014   Tdap 08/14/2014    Tobacco History: Social  History   Tobacco Use  Smoking Status Former   Current packs/day: 0.00   Types: Cigarettes   Quit date: 2000   Years since quitting: 25.9  Smokeless Tobacco Never   Counseling given: Not Answered   Outpatient Encounter Medications as of 11/14/2024  Medication Sig   acetaminophen  (TYLENOL ) 500 MG tablet Take 1,000 mg by mouth every 6 (six) hours as needed for moderate pain or headache.   albuterol  (PROAIR  HFA) 108 (90 Base) MCG/ACT inhaler Inhale 2 puffs into the lungs 4 (four) times daily as needed for shortness of breath   cholecalciferol  (VITAMIN D3) 25 MCG (1000 UT) tablet Take 2,000 Units by mouth daily.   Cyanocobalamin  (VITAMIN B 12 PO) Take by mouth.   glucose blood (ACCU-CHEK GUIDE) test strip Use as instructed   Lancets (ACCU-CHEK MULTICLIX) lancets USE TO CHECK BLOOD SUGAR TWICE DAILY   Multiple Vitamins-Minerals (MULTIVITAMIN WITH MINERALS) tablet Take 2 tablets by mouth daily.    polyethylene glycol (MIRALAX  / GLYCOLAX ) 17 g packet Take 17 g by mouth daily as needed for mild constipation.   Polyvinyl Alcohol -Povidone PF (REFRESH) 1.4-0.6 % SOLN Place 2 drops into both eyes daily as needed.   [DISCONTINUED] amLODipine  (NORVASC ) 10 MG tablet Take 1 tablet (10 mg total) by mouth daily.   [DISCONTINUED] baclofen  (LIORESAL ) 10 MG tablet Take 1 tablet (10 mg total) by mouth 3 (three) times daily.   [DISCONTINUED] hydrochlorothiazide  (HYDRODIURIL ) 12.5 MG tablet Take 1 tablet (12.5 mg total) by mouth daily.   [DISCONTINUED] lisinopril  (ZESTRIL ) 40 MG tablet Take 1  tablet (40 mg total) by mouth daily.   [DISCONTINUED] metFORMIN  (GLUCOPHAGE -XR) 500 MG 24 hr tablet Take 1 tablet (500 mg total) by mouth 2 (two) times daily with a meal.   [DISCONTINUED] rosuvastatin  (CRESTOR ) 20 MG tablet Take 1 tablet (20 mg total) by mouth daily.   [DISCONTINUED] Semaglutide , 1 MG/DOSE, (OZEMPIC , 1 MG/DOSE,) 4 MG/3ML SOPN Inject 1 mg into the skin once a week.   amLODipine  (NORVASC ) 10 MG tablet Take  1 tablet (10 mg total) by mouth daily.   baclofen  (LIORESAL ) 10 MG tablet Take 1 tablet (10 mg total) by mouth 3 (three) times daily.   hydrochlorothiazide  (HYDRODIURIL ) 12.5 MG tablet Take 1 tablet (12.5 mg total) by mouth daily.   lisinopril  (ZESTRIL ) 40 MG tablet Take 1 tablet (40 mg total) by mouth daily.   metFORMIN  (GLUCOPHAGE -XR) 500 MG 24 hr tablet Take 1 tablet (500 mg total) by mouth 2 (two) times daily with a meal.   rosuvastatin  (CRESTOR ) 20 MG tablet Take 1 tablet (20 mg total) by mouth daily.   Semaglutide , 1 MG/DOSE, (OZEMPIC , 1 MG/DOSE,) 4 MG/3ML SOPN Inject 1 mg into the skin once a week.   [DISCONTINUED] amLODIPine -Valsartan -HCTZ 10-320-25 MG TABS Take 1 tablet by mouth daily.   No facility-administered encounter medications on file as of 11/14/2024.    Review of Systems  Review of Systems  Constitutional: Negative.   HENT: Negative.    Cardiovascular: Negative.   Gastrointestinal: Negative.   Allergic/Immunologic: Negative.   Neurological: Negative.   Psychiatric/Behavioral: Negative.       Objective:   BP 135/82 (BP Location: Left Arm, Patient Position: Sitting)   Pulse 76   Wt 212 lb (96.2 kg)   SpO2 100%   BMI 32.23 kg/m   Wt Readings from Last 5 Encounters:  11/14/24 212 lb (96.2 kg)  08/21/24 213 lb (96.6 kg)  08/14/24 213 lb (96.6 kg)  05/05/24 216 lb 3.2 oz (98.1 kg)  01/24/24 213 lb (96.6 kg)     Physical Exam Vitals and nursing note reviewed.  Constitutional:      General: She is not in acute distress.    Appearance: She is well-developed.  Cardiovascular:     Rate and Rhythm: Normal rate and regular rhythm.  Pulmonary:     Effort: Pulmonary effort is normal.     Breath sounds: Normal breath sounds.  Neurological:     Mental Status: She is alert and oriented to person, place, and time.       Assessment & Plan:   Type 2 diabetes mellitus with hyperosmolarity without coma, unspecified whether long term insulin  use (HCC) -      POCT glycosylated hemoglobin (Hb A1C) -     metFORMIN  HCl ER; Take 1 tablet (500 mg total) by mouth 2 (two) times daily with a meal.  Dispense: 180 tablet; Refill: 3 -     Ozempic  (1 MG/DOSE); Inject 1 mg into the skin once a week.  Dispense: 3 mL; Refill: 5  HTN (hypertension), malignant -     amLODIPine  Besylate; Take 1 tablet (10 mg total) by mouth daily.  Dispense: 90 tablet; Refill: 3 -     Lisinopril ; Take 1 tablet (40 mg total) by mouth daily.  Dispense: 90 tablet; Refill: 3  Other orders -     Baclofen ; Take 1 tablet (10 mg total) by mouth 3 (three) times daily.  Dispense: 30 each; Refill: 2 -     hydroCHLOROthiazide ; Take 1 tablet (12.5 mg total) by mouth daily.  Dispense: 90 tablet; Refill: 3 -     Rosuvastatin  Calcium ; Take 1 tablet (20 mg total) by mouth daily.  Dispense: 90 tablet; Refill: 3     Return in about 3 months (around 02/14/2025).     Bascom GORMAN Borer, NP 11/14/2024

## 2024-11-17 ENCOUNTER — Other Ambulatory Visit (HOSPITAL_COMMUNITY): Payer: Self-pay

## 2024-12-06 ENCOUNTER — Other Ambulatory Visit (HOSPITAL_COMMUNITY): Payer: Self-pay

## 2024-12-07 ENCOUNTER — Other Ambulatory Visit (HOSPITAL_COMMUNITY): Payer: Self-pay

## 2024-12-09 ENCOUNTER — Other Ambulatory Visit (HOSPITAL_COMMUNITY): Payer: Self-pay

## 2024-12-09 ENCOUNTER — Telehealth: Payer: Self-pay | Admitting: Pharmacy Technician

## 2024-12-09 NOTE — Progress Notes (Signed)
° °  12/09/2024  Patient ID: Candace Diaz, female   DOB: 04-Apr-1956, 68 y.o.   MRN: [id number]  Patient engaged with clinical pharmacist for management of diabetes on 09/17/2024. Outreach by [employer] technician was requested.   Outreached patient to discuss diabetes medication management. Left voicemail for patient to return my call at their convenience.    Myrick Mcnairy, CPhT [facility] Office: [phone] Email: [email or web address]

## 2024-12-09 NOTE — Progress Notes (Signed)
 12/09/2024 Name: Candace Diaz MRN: [id number] DOB: [birth year]  Patient is appearing for a follow-up visit with the population health pharmacy technician. Last engaged with the clinical pharmacist to discuss diabetes on 09/17/2024. Patient is returning the call left earlier today to discuss diabetes.   Plan from last clinical pharmacist appointment:  Diabetes: - Currently uncontrolled with last A1C 7.1% above goal < 7%, but only slightly worsened from 6.9%. Patient is tolerating Ozempic  well. She has ~2 more boxes remaining of Ozempic  0.5 mg weekly. Will discuss increasing to Ozempic  1 mg at next fill since she is tolerating well.  - Reviewed long term cardiovascular and renal outcomes of uncontrolled blood sugar - Reviewed goal A1c, goal fasting, and goal 2 hour post prandial glucose - Recommend to continue Ozempic  0.5 mg weekly and plan to increase to Ozempic  1 mg weekly at next fill - Continue metformin  XR 500 mg BID.  - Requested refill of metformin  from [facility] pharmacy.  - Patient denies personal or family history of multiple endocrine neoplasia type 2, medullary thyroid  cancer; personal history of pancreatitis or gallbladder disease. - Recommend to check fasting blood glucose daily and bring glucometer to upcoming appointments Hypertension: - Currently controlled with clinic BP below goal < 130/80 mmHg. Previous clinic BP was close to goal. She is tolerating her current regimen well and last BMP was stable. Could consider switching to triple combination tablet in the future, but hesitant to make changes now that patient is demonstrating good adherence and BP control. Would have to choose dose carefully, as patient had hypokalemia on hydrochlorothiazide  25 mg daily.  - Reviewed long term cardiovascular and renal outcomes of uncontrolled blood pressure - Reviewed appropriate blood pressure monitoring technique and reviewed goal blood pressure.  - Recommended to check home blood pressure and  heart rate daily and bring a log of readings to upcoming appointments.Encouraged patient to bring her BP device to her upcoming appointments. - Recommend to continue amlodipine  10 mg daily, hydrochlorothiazide  12.5 mg daily, and lisinopril  40 mg daily Hyperlipidemia/ASCVD Risk Reduction: - Currently controlled with last LDL 41 mg/dL, at goal <44 mg/dL given history of CVA with risk factors DM and HTN - Recommend to continue rosuvastatin  20 mg daily  -Follow Up Plan: PCP 11/14/24, PharmD telephone 10/29/24(copy/paste from last note)   Medication Adherence Barriers Identified:  Patient made recommended medication changes per plan: No Patient has not yet increased Ozempic  to 1mg . She informs she still has a supply of the red Ozempic  and once she has used that up she will go the blue Ozempic . She informs she takes Amlodipine , Hydrochlorothiazide , Lisinopril  and Rosuvastatin  daily. She also informs taking Metformin  twice a day. Access issues with any new medication or testing device: Yes Patient informs she is losing her insurance at the end of the month.She informs she has an appointment at the end of the month to speak to a social worker about insurance. She informs her daughter plans to attend this meeting with her. She informs she will need to CANCEL the appointment with the pharmacist on 12/31/23 as her transportation is linked to her insurance and since she is losing that she will not be able to attend the appointment. Will send PharmD a message about this. Per Arnaldo at [facility] the Amlodipine  and Hydrochlorothiazide  were shipped yesterday and should be delivered today. She informs she will refill Lisinopril  and Metformin  today and will place them in the delivery que to be delivered this week. Crestor  was last filled  on 10/27/24 for 90 tablets. Ozempic  0.25mg /0.5mg  was filled for 3ml on 01/02/24, 9ml on 3/11 and 9ml on 07/30/24. Ozempic  1mg  was filled for 3ml on 10/29/24.   Medication  Adherence Barriers Addressed/Actions Taken:  Reviewed medication changes per plan from last clinical pharmacist note Medication Access for Lisinopril  and Metformin  Will discuss medication access concerns with pharmacist Contacted pharmacy regarding new prescriptions/refills Educated patient to contact pharmacy regarding new prescriptions/refills  Reminded patient of date/time of upcoming clinical pharmacist follow up and any upcoming PCP/specialists visits. Patient denies transportation barriers to the appointment. No  Next clinical pharmacist appointment is scheduled for: 12/30/2024 but patient needs to cancel due to transportation issues as noted above.  Candace Diaz, CPhT [facility] Office: [phone] Email: [email or web address]

## 2024-12-30 ENCOUNTER — Other Ambulatory Visit (HOSPITAL_COMMUNITY): Payer: Self-pay

## 2024-12-30 ENCOUNTER — Telehealth: Payer: Self-pay

## 2024-12-30 ENCOUNTER — Ambulatory Visit: Payer: Self-pay

## 2024-12-30 NOTE — Telephone Encounter (Signed)
 Attempted to contact patient for scheduled appointment for medication management. Left HIPAA compliant message for patient to return my call at their convenience.   Lorain Baseman, PharmD [facility] [phone]

## 2024-12-30 NOTE — Progress Notes (Deleted)
 "  12/30/2024 Name: Candace Diaz MRN: [id number] DOB: 06-17-1956  No chief complaint on file.   Candace Diaz is a 69 y.o. year old female who presented for a face to face visit. She is hard of hearing. She presents alone today.   They were referred to the pharmacist by their PCP for assistance in managing diabetes. PMH includes HTN, T2DM, OA of knee s/p TKA, hx of palpitations, HLD, CVA with residual deficit, BMI > 30.    Subjective: Patient was seen by PCP, Bascom Borer, NP on 05/05/24. BP was elevated to 145/75 mmHg, HR 72. A1C improved to 6.9% from 7.1%.. At last pharmacist telephone appt on 05/05/24 - pt reported home BP of 139/84. It was noted that she had recently decreased hydrochlorothiazide  due to hypokalemia (K 3.4 mEq/L), but had not had a repeat BMET yet. Patient was seen by pharmacy in-[place] on 06/10/24. Her BP was 143/82 mmHg. Her K had normalized on he repeat BMP. She was instructed to increase lisinopril  to 40 mg daily. At follow-up on 07/30/24 patient brought a list of her medications at home BP which were elevated, but home cuff has not been validated. Home BP was close to goal in the setting of patient only taking lisinopril  that morning. Identified adherence concerns with Ozempic  due to fill hx and requested fill from pharmacy. BMP demonstrated stable Scr and K after increasing lisinopril .   Today, patient reports doing well. She brought her medications with her, but did not bring her BP cuff. Reports she has forgotten to check her BP at home recently. She took lisinopril  and hydrochlorothiazide  this morning and reports she took amlodipine  yesterday. She reports she has been taking Ozempic  0.5 mg weekly as prescribed. Denies GI AE or severe appetite suppression.   Care Team: Primary Care Provider: Borer Bascom RAMAN, NP ; Next Scheduled Visit: 11/14/24  Medication Access/Adherence  Current Pharmacy:  [facility] [address] [place]  [place] [address] Phone: [phone] Fax: [phone]   Patient reports affordability concerns with their medications: No  Patient reports access/transportation concerns to their pharmacy: No  Patient reports adherence concerns with their medications:  No  - has had difficulty in the past refilling medications, but fill history currently appears appropriate. She is using [employer] for mail order.   Diabetes:  Current medications: Ozempic  0.5 mg weekly (Wednesdays, Denies missed doses), metformin  XR 500 mg BID (due for refill)  Using glucometer; testing every morning  AM Fasting BG today ~114 mg/dL. She does not have her glucometer with her.   Patient denies hypoglycemic s/sx including dizziness, shakiness, sweating. Patient denies hyperglycemic symptoms including polyuria, polydipsia, polyphagia, nocturia, neuropathy, blurred vision.   Hypertension:  Current medications: lisinopril  40 mg daily, amlodipine  10 mg daily, hydrochlorothiazide  12.5 mg daily Medications previously tried: hydrochlorothiazide  25 mg (hypokalemia)  Reports this morning she only took lisinopril  40 mg and hydrochlorothiazide  12.5 mg (not amlodipine ), because she did not eat yet (because she was coming to her appt). She usually takes all her medications in the morning with breakfast.   Patient has an automated, upper arm home BP cuff. Reports that it has not been validated in clinic. She confirms proper BP check technique - rests in a seated position for several minutes prior to checking. No caffeine within 30 minutes of checking.   Patient-reported home BP : none to report today  Patient denies hypotensive s/sx including dizziness, lightheadedness.  Patient denies hypertensive symptoms including pain,  shortness of breath. Patient endorses occasional headaches.   Hyperlipidemia/ASCVD Risk Reduction  Current lipid lowering medications: rosuvastatin  20 mg daily Medications tried in the past: simvastatin   (switched d/t drug interaction with amlodipine )  ASCVD History: CVA  Objective:  BP Readings from Last 3 Encounters:  11/14/24 135/82  09/17/24 119/75  08/14/24 137/74    Lab Results  Component Value Date   HGBA1C 7.3 (A) 11/14/2024   HGBA1C 7.1 (A) 08/14/2024   HGBA1C 6.9 (A) 05/05/2024   UACR 01/24/24: 23 mg/g  Lab Results  Component Value Date   CREATININE 0.86 07/30/2024   BUN 14 07/30/2024   NA 143 07/30/2024   K 3.7 07/30/2024   CL 100 07/30/2024   CO2 24 07/30/2024    Lab Results  Component Value Date   CHOL 113 01/24/2024   HDL 55 01/24/2024   LDLCALC 41 01/24/2024   TRIG 86 01/24/2024   CHOLHDL 2.1 01/24/2024    Medications Reviewed Today   Medications were not reviewed in this encounter       Assessment/Plan:   Diabetes: - Currently uncontrolled with last A1C 7.1% above goal < 7%, but only slightly worsened from 6.9%. Patient is tolerating Ozempic  well. She has ~2 more boxes remaining of Ozempic  0.5 mg weekly. Will discuss increasing to Ozempic  1 mg at next fill since she is tolerating well.  - Reviewed long term cardiovascular and renal outcomes of uncontrolled blood sugar - Reviewed goal A1c, goal fasting, and goal 2 hour post prandial glucose - Recommend to continue Ozempic  0.5 mg weekly and plan to increase to Ozempic  1 mg weekly at next fill - Continue metformin  XR 500 mg BID.  - Requested refill of metformin  from [facility] pharmacy.  - Patient denies personal or family history of multiple endocrine neoplasia type 2, medullary thyroid  cancer; personal history of pancreatitis or gallbladder disease. - Recommend to check fasting blood glucose daily and bring glucometer to upcoming appointments   Hypertension: - Currently controlled with clinic BP below goal < 130/80 mmHg. Previous clinic BP was close to goal. She is tolerating her current regimen well and last BMP was stable. Could consider switching to triple combination tablet in the future, but  hesitant to make changes now that patient is demonstrating good adherence and BP control. Would have to choose dose carefully, as patient had hypokalemia on hydrochlorothiazide  25 mg daily.  - Reviewed long term cardiovascular and renal outcomes of uncontrolled blood pressure - Reviewed appropriate blood pressure monitoring technique and reviewed goal blood pressure.  - Recommended to check home blood pressure and heart rate daily and bring a log of readings to upcoming appointments.Encouraged patient to bring her BP device to her upcoming appointments. - Recommend to continue amlodipine  10 mg daily, hydrochlorothiazide  12.5 mg daily, and lisinopril  40 mg daily   Hyperlipidemia/ASCVD Risk Reduction: - Currently controlled with last LDL 41 mg/dL, at goal <44 mg/dL given history of CVA with risk factors DM and HTN - Recommend to continue rosuvastatin  20 mg daily    Follow Up Plan: PCP 11/14/24, PharmD telephone 10/29/24  Lorain Baseman, PharmD [facility] [phone]   "

## 2024-12-31 ENCOUNTER — Other Ambulatory Visit (HOSPITAL_COMMUNITY): Payer: Self-pay

## 2024-12-31 ENCOUNTER — Telehealth: Payer: Self-pay | Admitting: Pharmacy Technician

## 2024-12-31 NOTE — Progress Notes (Addendum)
 "  12/31/2024  Patient ID: Candace Diaz, female   DOB: 1956-05-11, 69 y.o.   MRN: [id number]  Patient engaged with clinical pharmacist for management of diabetes on 09/17/2024. Outreach by [employer] technician was requested.   Outreached patient to discuss diabetes, hypertension, hyperlipidemia/cardiovascular risk reduction, medication access, and medication adherence medication management. Left voicemail for patient to return my call at their convenience.     ADDENDUM 17/20262:28pm-Patient returned the call  Patient is appearing for a follow-up visit with the population health pharmacy technician. Last engaged with the clinical pharmacist to discuss diabetes on 09/17/2024. Patient returned the call  today to discuss diabetes.   Plan from last clinical pharmacist appointment:  Diabetes: - Currently uncontrolled with last A1C 7.1% above goal < 7%, but only slightly worsened from 6.9%. Patient is tolerating Ozempic  well. She has ~2 more boxes remaining of Ozempic  0.5 mg weekly. Will discuss increasing to Ozempic  1 mg at next fill since she is tolerating well.  - Reviewed long term cardiovascular and renal outcomes of uncontrolled blood sugar - Reviewed goal A1c, goal fasting, and goal 2 hour post prandial glucose - Recommend to continue Ozempic  0.5 mg weekly and plan to increase to Ozempic  1 mg weekly at next fill - Continue metformin  XR 500 mg BID.  - Requested refill of metformin  from [facility] pharmacy.  - Patient denies personal or family history of multiple endocrine neoplasia type 2, medullary thyroid  cancer; personal history of pancreatitis or gallbladder disease. - Recommend to check fasting blood glucose daily and bring glucometer to upcoming appointments Hypertension: - Currently controlled with clinic BP below goal < 130/80 mmHg. Previous clinic BP was close to goal. She is tolerating her current regimen well and last BMP was stable. Could consider switching to triple  combination tablet in the future, but hesitant to make changes now that patient is demonstrating good adherence and BP control. Would have to choose dose carefully, as patient had hypokalemia on hydrochlorothiazide  25 mg daily.  - Reviewed long term cardiovascular and renal outcomes of uncontrolled blood pressure - Reviewed appropriate blood pressure monitoring technique and reviewed goal blood pressure.  - Recommended to check home blood pressure and heart rate daily and bring a log of readings to upcoming appointments.Encouraged patient to bring her BP device to her upcoming appointments. - Recommend to continue amlodipine  10 mg daily, hydrochlorothiazide  12.5 mg daily, and lisinopril  40 mg daily  Hyperlipidemia/ASCVD Risk Reduction: - Currently controlled with last LDL 41 mg/dL, at goal <44 mg/dL given history of CVA with risk factors DM and HTN - Recommend to continue rosuvastatin  20 mg daily  -Follow Up Plan: PCP 11/14/24, PharmD telephone 10/29/24(copy/paste from last note)   Medication Adherence Barriers Identified:  Patient made recommended medication changes per plan: No Patient has NOT increased dose of Ozempic  to 1mg . She informs she was to use up Ozempic  0.5mg  (red box) before starting the Ozempic  1mg  (blue box). She informs she has 1 full box and 1 partial box of Ozempic  0.5mg  remaining. She informs she has 1 full box of Ozempic  1mg . She informs she administers her Ozempic  injection weekly on Wednesdays. She informs she is taking Metformin  XR 500mg  twice a day, Amlodipine  10mg  daily, Hydrochlorothiazide  12.5mg  daily, Lisinopril  40mg  daily and Rosuvastating 20mg  daily. Access issues with any new medication or testing device: No At this time patient informs she has all of her medications and Dr Annemarie seems to support this. However, patient currently has NO insurance. 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 She informs she needs to call social worker with social security at the end  of the month to discuss. Patient was able to get most medications filled last month for 90 day supply with the exception of Ozempic . Per Fill history data, Metformin  was filled for 180 on 12/16, Amlodipine  and Hydrochlorothiazide  for 90 on 12/15, Lisinopril  for 90 on 12/16 and Rosuvastatin  for 90 on 11/4. Ozempic  was last filled for 0.5mg , a 90 day supply or 9ml, on 8/7 and 1mg  was filled on 11/5 for 3ml or 28 day supply.     Medication Adherence Barriers Addressed/Actions Taken:  Reviewed medication changes per plan from last clinical pharmacist note Educated patient to contact pharmacy regarding refills Reviewed instructions for monitoring blood sugars at home and reminded patient to keep a written log to review with pharmacist Reminded patient of date/time of upcoming clinical pharmacist follow up and any upcoming PCP/specialists visits. Patient denies transportation barriers to the appointment. Yes  Next clinical pharmacist appointment is scheduled for: 02/18/2025   Keiara Sneeringer, CPhT [facility] Office: [phone] Email: [email or web address]  "

## 2024-12-31 NOTE — Progress Notes (Signed)
 Per [facility], insurance is populating for patient effective 12/25/24. Ran test claim for Ozempic  1 mg which was showing copay of $4.90/30 ds (Medicare Extra Help).   Requested that [facility] pharmacy tech D[place], relay this to patient to ease her concerns about insurance coverage and encourage her to take her medications as prescribed rather than stretch supplies.   Lorain Baseman, PharmD [facility] [phone]

## 2025-01-05 ENCOUNTER — Telehealth: Payer: Self-pay | Admitting: Pharmacy Technician

## 2025-01-05 NOTE — Progress Notes (Signed)
" ° °  01/05/2025  Patient ID: Candace Diaz, female   DOB: 07/23/1956, 69 y.o.   MRN: [id number]  Patient enaged with clinical pharmacist for management of diabetes on 09/17/2024. PharmDperformed test claim for Ozempic  1mg  which was showing copay of $4.90/30ds (Medicare Extra Help).Outreach by [employer] technician was requested.   Outreached patient to discuss medication access and adherence medication management. Left voicemail for patient to return my call at their convenience.    Kaylea Mounsey, CPhT [facility] Office: [phone] Email: [email or web address]  "

## 2025-01-07 ENCOUNTER — Telehealth: Payer: Self-pay | Admitting: Pharmacy Technician

## 2025-01-07 NOTE — Progress Notes (Signed)
 "  01/07/2025  Patient ID: Candace Diaz, female   DOB: 1956/08/21, 69 y.o.   MRN: [id number]    01/07/2025 Name: Candace Diaz MRN: [id number] DOB: [birth year]  Patient is appearing for a follow-up visit with the population health pharmacy technician. Last engaged with the clinical pharmacist to discuss diabetes on 09/17/2024. Contacted patient today to discuss insurance.   Plan from last clinical pharmacist appointment:   Diabetes:  Currently uncontrolled with last A1C 7.1% above goal < 7%, but only slightly worsened from 6.9%. Patient is tolerating Ozempic  well. She has ~2 more boxes remaining of Ozempic  0.5 mg weekly. Will discuss increasing to Ozempic  1 mg at next fill since she is tolerating well.  - Reviewed long term cardiovascular and renal outcomes of uncontrolled blood sugar - Reviewed goal A1c, goal fasting, and goal 2 hour post prandial glucose - Recommend to continue Ozempic  0.5 mg weekly and plan to increase to Ozempic  1 mg weekly at next fill - Continue metformin  XR 500 mg BID.  - Requested refill of metformin  from [facility] pharmacy.  - Patient denies personal or family history of multiple endocrine neoplasia type 2, medullary thyroid  cancer; personal history of pancreatitis or gallbladder disease. - Recommend to check fasting blood glucose daily and bring glucometer to upcoming appointments Hypertension: - Currently controlled with clinic BP below goal < 130/80 mmHg. Previous clinic BP was close to goal. She is tolerating her current regimen well and last BMP was stable. Could consider switching to triple combination tablet in the future, but hesitant to make changes now that patient is demonstrating good adherence and BP control. Would have to choose dose carefully, as patient had hypokalemia on hydrochlorothiazide  25 mg daily.  - Reviewed long term cardiovascular and renal outcomes of uncontrolled blood pressure - Reviewed appropriate blood pressure monitoring technique and reviewed  goal blood pressure.  - Recommended to check home blood pressure and heart rate daily and bring a log of readings to upcoming appointments.Encouraged patient to bring her BP device to her upcoming appointments. - Recommend to continue amlodipine  10 mg daily, hydrochlorothiazide  12.5 mg daily, and lisinopril  40 mg daily  Hyperlipidemia/ASCVD Risk Reduction: - Currently controlled with last LDL 41 mg/dL, at goal <44 mg/dL given history of CVA with risk factors DM and HTN - Recommend to continue rosuvastatin  20 mg daily  -Follow Up Plan: PCP 11/14/24, PharmD telephone 10/29/24 -On 12/31/2024, PharmDperformed test claim for Ozempic  1mg  which was showing copay of $4.90/30ds (Medicare Extra Help)  (copy/paste from last note)   Medication Adherence Barriers Identified:   Access issues with any new medication or testing device: Previously on 12/31/2024, this CPhT outreached patient about Ozempic . Patient expressed concern about insurance and having to wait until end of January to receive more information about insurance. Outreached patient today. HIPAA verified. Patient was informed that [facility] is showing patient has [employer] Part D with Medicare Extra Help as outlined by PharmD above.Patient was informed that a test claim was performed by clinic PharmD on patient's Ozempic  and the copay is hsowing $4.90 for 30 day supply. Patient was informed to present new insurance card to the pharmacy that she has been using and that copay should be $4.90. Patient informs she uses [facility] and has her medications delivered.  Patient was informed procedure would be the same for 2026. She was informed her insurance would be billed and her card on file would be charged copay. Patient verbalized understanding. Patient was encouraged to use Ozempic  weekly and to  not skip doses. Patient previously informed CPhT that she had her maintenance medications and today she informs the same.  Medication Adherence Barriers  Addressed/Actions Taken:  Reviewed medication changes per plan from last clinical pharmacist note Educated patient to contact pharmacy regarding new prescriptions Reminded patient of date/time of upcoming clinical pharmacist follow up and any upcoming PCP/specialists visits. Patient denies transportation barriers to the appointment. Yes  Next clinical pharmacist appointment is scheduled for: 02/18/2025  Johanthan Kneeland, CPhT [facility] Office: [phone] Email: [email or web address]   "

## 2025-02-13 ENCOUNTER — Ambulatory Visit: Payer: Self-pay | Admitting: Nurse Practitioner

## 2025-02-18 ENCOUNTER — Other Ambulatory Visit: Payer: Self-pay

## 2025-08-27 ENCOUNTER — Ambulatory Visit: Payer: Self-pay
